# Patient Record
Sex: Female | Born: 1935 | Race: White | Hispanic: No | Smoking: Never smoker
Health system: Southern US, Community
[De-identification: ages and names within clinical notes are randomized; demographics above are authoritative.]

## PROBLEM LIST (undated history)

## (undated) DIAGNOSIS — D649 Anemia, unspecified: Secondary | ICD-10-CM

## (undated) DIAGNOSIS — I2510 Atherosclerotic heart disease of native coronary artery without angina pectoris: Secondary | ICD-10-CM

## (undated) DIAGNOSIS — F329 Major depressive disorder, single episode, unspecified: Secondary | ICD-10-CM

## (undated) DIAGNOSIS — E7800 Pure hypercholesterolemia, unspecified: Secondary | ICD-10-CM

## (undated) DIAGNOSIS — I10 Essential (primary) hypertension: Secondary | ICD-10-CM

## (undated) DIAGNOSIS — F32A Depression, unspecified: Secondary | ICD-10-CM

## (undated) DIAGNOSIS — E538 Deficiency of other specified B group vitamins: Secondary | ICD-10-CM

## (undated) DIAGNOSIS — C801 Malignant (primary) neoplasm, unspecified: Secondary | ICD-10-CM

## (undated) HISTORY — PX: TONSILLECTOMY AND ADENOIDECTOMY: SHX28

## (undated) HISTORY — DX: Pure hypercholesterolemia, unspecified: E78.00

## (undated) HISTORY — PX: APPENDECTOMY: SHX54

## (undated) HISTORY — DX: Anemia, unspecified: D64.9

## (undated) HISTORY — DX: Essential (primary) hypertension: I10

## (undated) HISTORY — DX: Depression, unspecified: F32.A

## (undated) HISTORY — DX: Major depressive disorder, single episode, unspecified: F32.9

## (undated) HISTORY — PX: ABDOMINAL HYSTERECTOMY: SHX81

## (undated) HISTORY — DX: Malignant (primary) neoplasm, unspecified: C80.1

## (undated) HISTORY — DX: Deficiency of other specified B group vitamins: E53.8

---

## 1983-02-23 HISTORY — PX: BREAST SURGERY: SHX581

## 1996-02-23 HISTORY — PX: ABDOMINOPLASTY: SUR9

## 1997-10-10 ENCOUNTER — Emergency Department (HOSPITAL_COMMUNITY): Admission: EM | Admit: 1997-10-10 | Discharge: 1997-10-10 | Payer: Self-pay | Admitting: Emergency Medicine

## 1998-12-08 ENCOUNTER — Ambulatory Visit (HOSPITAL_BASED_OUTPATIENT_CLINIC_OR_DEPARTMENT_OTHER): Admission: RE | Admit: 1998-12-08 | Discharge: 1998-12-08 | Payer: Self-pay | Admitting: General Surgery

## 1999-07-13 ENCOUNTER — Encounter: Payer: Self-pay | Admitting: Orthopedic Surgery

## 1999-07-13 ENCOUNTER — Encounter: Admission: RE | Admit: 1999-07-13 | Discharge: 1999-07-13 | Payer: Self-pay | Admitting: Orthopedic Surgery

## 2000-02-10 ENCOUNTER — Other Ambulatory Visit: Admission: RE | Admit: 2000-02-10 | Discharge: 2000-02-10 | Payer: Self-pay | Admitting: Obstetrics and Gynecology

## 2001-02-10 ENCOUNTER — Other Ambulatory Visit: Admission: RE | Admit: 2001-02-10 | Discharge: 2001-02-10 | Payer: Self-pay | Admitting: Obstetrics and Gynecology

## 2002-02-06 ENCOUNTER — Other Ambulatory Visit: Admission: RE | Admit: 2002-02-06 | Discharge: 2002-02-06 | Payer: Self-pay | Admitting: Gynecology

## 2003-03-07 ENCOUNTER — Other Ambulatory Visit: Admission: RE | Admit: 2003-03-07 | Discharge: 2003-03-07 | Payer: Self-pay | Admitting: Gynecology

## 2005-04-06 ENCOUNTER — Other Ambulatory Visit: Admission: RE | Admit: 2005-04-06 | Discharge: 2005-04-06 | Payer: Self-pay | Admitting: Gynecology

## 2009-07-01 ENCOUNTER — Encounter: Payer: Self-pay | Admitting: Internal Medicine

## 2009-07-02 ENCOUNTER — Encounter: Payer: Self-pay | Admitting: Internal Medicine

## 2009-08-04 ENCOUNTER — Ambulatory Visit: Payer: Self-pay | Admitting: Internal Medicine

## 2009-08-04 DIAGNOSIS — G4730 Sleep apnea, unspecified: Secondary | ICD-10-CM

## 2009-08-04 DIAGNOSIS — G4710 Hypersomnia, unspecified: Secondary | ICD-10-CM | POA: Insufficient documentation

## 2009-08-07 DIAGNOSIS — J309 Allergic rhinitis, unspecified: Secondary | ICD-10-CM | POA: Insufficient documentation

## 2009-08-07 DIAGNOSIS — E039 Hypothyroidism, unspecified: Secondary | ICD-10-CM | POA: Insufficient documentation

## 2009-08-07 DIAGNOSIS — I10 Essential (primary) hypertension: Secondary | ICD-10-CM

## 2010-03-26 NOTE — Assessment & Plan Note (Signed)
Summary: sleep consult-orginal appt cancelled/kcw   Primary Provider/Referring Provider:  Dr Lucianne Muss  CC:  Sleep Consult-Dr. Lucianne Muss.Barbara Maxwell  History of Present Illness: August 04, 2009- 75 yoF referred courtesy of Dr Lucianne Muss for sleep apnea. She was complaining of long-standing snoring, daytime sleepiness, unrefreshing sleep. husband tells her these have been worse in past year as she has gained 15-20 lbs. Daily naps. Bedtime 10-11Pm, latency less than 10 minutes, waking 2-3 x before up at 630-7AM.  ENT surgery- tonsils. Hx goiter, bruxism, allergic rhinitis, HBP, depression. Denies cardiopulmonary disease. Had what apears to be a 3-channel home screening sleep study 07/01/09 recording AHI 15.8, c/w mild to moderate obstructive sleep apnea.   Current Medications (verified): 1)  Crestor 40 Mg Tabs (Rosuvastatin Calcium) .... Take 1 By Mouth Once Daily 2)  Benicar 20 Mg Tabs (Olmesartan Medoxomil) .... Take 1 By Mouth Once Daily 3)  Zetia 10 Mg Tabs (Ezetimibe) .... Take 1 By Mouth Once Daily 4)  Toprol Xl 25 Mg Xr24h-Tab (Metoprolol Succinate) .... Take 1/2 By Mouth Once Daily 5)  Boniva 150 Mg Tabs (Ibandronate Sodium) .... Monthly 6)  Aspirin 81 Mg Tbec (Aspirin) .... Take 1 By Mouth Once Daily 7)  Zoloft 50 Mg Tabs (Sertraline Hcl) .... Take 1 By Mouth Once Daily 8)  Xanax 0.25 Mg Tabs (Alprazolam) .... Take 1 By Mouth Three Times A Day As Needed 9)  Vagifem 10 Mcg Tabs (Estradiol) .Barbara Maxwell.. 1 Weekly 10)  Bupropion Hcl 150 Mg Xr24h-Tab (Bupropion Hcl) .... Take 1 By Mouth Once Daily  Allergies (verified): No Known Drug Allergies  Past History:  Past Medical History: Obstructive sleep apnea- Home study 07/01/09- AHI 15.8/ hr Depression Allergic Rhinitis Hypertension Hypothyroidism/ goiter  Past Surgical History: Tonsillectomy breast reduction Total Abdominal Hysterectomy Appendectomy  Family History: Family hx of Cancer-sister(ovary) Family hx of Heart Disease-father Family hx of  RA-father and mother Brother- OSA/ CPAP  Social History: Married  Retired Non smoker no ETOH  Review of Systems      See HPI       The patient complains of weight change, tooth/dental problems, headaches, and anxiety.  The patient denies shortness of breath with activity, shortness of breath at rest, productive cough, non-productive cough, coughing up blood, chest pain, irregular heartbeats, acid heartburn, indigestion, loss of appetite, abdominal pain, difficulty swallowing, sore throat, nasal congestion/difficulty breathing through nose, sneezing, itching, ear ache, depression, hand/feet swelling, joint stiffness or pain, rash, change in color of mucus, and fever.    Vital Signs:  Patient profile:   75 year old female Height:      63.5 inches Weight:      175 pounds BMI:     30.62 O2 Sat:      100 % on Room air Pulse rate:   74 / minute BP sitting:   132 / 70  (right arm) Cuff size:   regular  Vitals Entered By: Reynaldo Minium CMA (August 04, 2009 11:58 AM)  O2 Flow:  Room air CC: Sleep Consult-Dr. Lucianne Muss.   Physical Exam  Additional Exam:  General: A/Ox3; pleasant and cooperative, NAD, mild overweight SKIN: no rash, lesions NODES: no lymphadenopathy HEENT: [place]/AT, EOM- WNL, Conjuctivae- clear, PERRLA, TM-WNL, Nose- clear, Throat- clear and wnl, Mallampati  III NECK: Supple w/ fair ROM, JVD- none, normal carotid impulses w/o bruits Thyroid-Right goiter, no stridor CHEST: Clear to P&A HEART: RRR, no m/g/r heard ABDOMEN: Soft and nl; nml bowel sounds; no organomegaly or masses noted VOH:YWVP, nl pulses, no edema  NEURO:  Grossly intact to observation      Impression & Recommendations:  Problem # 1:  HYPERSOMNIA WITH SLEEP APNEA UNSPECIFIED (ICD-780.53)  We discussed the results of her 3-channel home sleep screening. We discussed the physiology and medical concerns and available treatments. She wants and is motivated tor try for awhile to try weght loss first. We agreed  to get her back in 4 months to see how she has done, with option  to get cpap autotitration and possibly to retest with formal sleep study. We emphasized her driving responsibility. Complicating cofactors include her goiter, which is not obstructing, her hx of hypertension and depression.  Medications Added to Medication List This Visit: 1)  Crestor 40 Mg Tabs (Rosuvastatin calcium) .... Take 1 by mouth once daily 2)  Benicar 20 Mg Tabs (Olmesartan medoxomil) .... Take 1 by mouth once daily 3)  Zetia 10 Mg Tabs (Ezetimibe) .... Take 1 by mouth once daily 4)  Toprol Xl 25 Mg Xr24h-tab (Metoprolol succinate) .... Take 1/2 by mouth once daily 5)  Boniva 150 Mg Tabs (Ibandronate sodium) .... Monthly 6)  Aspirin 81 Mg Tbec (Aspirin) .... Take 1 by mouth once daily 7)  Zoloft 50 Mg Tabs (Sertraline hcl) .... Take 1 by mouth once daily 8)  Xanax 0.25 Mg Tabs (Alprazolam) .... Take 1 by mouth three times a day as needed 9)  Vagifem 10 Mcg Tabs (Estradiol) .Barbara KitchenMarland KitchenMarland Maxwell 1 weekly 10)  Bupropion Hcl 150 Mg Xr24h-tab (Bupropion hcl) .... Take 1 by mouth once daily  Other Orders: Consultation Level IV ([id number])  Patient Instructions: 1)  Please schedule a follow-up appointment in 4 months. 2)  Please be carefull with your driving 3)  Let's see how you do at losing some weight and whether that changes what you and your husband notice as far as sleepiness and snoring. 4)  Copy sent to: Dr Lucianne Muss

## 2011-04-20 DIAGNOSIS — M949 Disorder of cartilage, unspecified: Secondary | ICD-10-CM | POA: Diagnosis not present

## 2011-04-20 DIAGNOSIS — E049 Nontoxic goiter, unspecified: Secondary | ICD-10-CM | POA: Diagnosis not present

## 2011-04-20 DIAGNOSIS — E785 Hyperlipidemia, unspecified: Secondary | ICD-10-CM | POA: Diagnosis not present

## 2011-04-20 DIAGNOSIS — M899 Disorder of bone, unspecified: Secondary | ICD-10-CM | POA: Diagnosis not present

## 2011-04-20 DIAGNOSIS — I10 Essential (primary) hypertension: Secondary | ICD-10-CM | POA: Diagnosis not present

## 2011-04-22 DIAGNOSIS — E559 Vitamin D deficiency, unspecified: Secondary | ICD-10-CM | POA: Diagnosis not present

## 2011-04-22 DIAGNOSIS — I10 Essential (primary) hypertension: Secondary | ICD-10-CM | POA: Diagnosis not present

## 2011-04-22 DIAGNOSIS — R7301 Impaired fasting glucose: Secondary | ICD-10-CM | POA: Diagnosis not present

## 2011-04-22 DIAGNOSIS — M899 Disorder of bone, unspecified: Secondary | ICD-10-CM | POA: Diagnosis not present

## 2011-04-22 DIAGNOSIS — E049 Nontoxic goiter, unspecified: Secondary | ICD-10-CM | POA: Diagnosis not present

## 2011-04-22 DIAGNOSIS — E785 Hyperlipidemia, unspecified: Secondary | ICD-10-CM | POA: Diagnosis not present

## 2011-07-07 DIAGNOSIS — H16209 Unspecified keratoconjunctivitis, unspecified eye: Secondary | ICD-10-CM | POA: Diagnosis not present

## 2011-07-07 DIAGNOSIS — H04129 Dry eye syndrome of unspecified lacrimal gland: Secondary | ICD-10-CM | POA: Diagnosis not present

## 2011-07-07 DIAGNOSIS — D3130 Benign neoplasm of unspecified choroid: Secondary | ICD-10-CM | POA: Diagnosis not present

## 2011-07-07 DIAGNOSIS — H259 Unspecified age-related cataract: Secondary | ICD-10-CM | POA: Diagnosis not present

## 2011-07-14 DIAGNOSIS — Z1231 Encounter for screening mammogram for malignant neoplasm of breast: Secondary | ICD-10-CM | POA: Diagnosis not present

## 2011-07-30 DIAGNOSIS — E7800 Pure hypercholesterolemia, unspecified: Secondary | ICD-10-CM | POA: Diagnosis not present

## 2011-07-30 DIAGNOSIS — N8184 Pelvic muscle wasting: Secondary | ICD-10-CM | POA: Diagnosis not present

## 2011-07-30 DIAGNOSIS — Z01419 Encounter for gynecological examination (general) (routine) without abnormal findings: Secondary | ICD-10-CM | POA: Diagnosis not present

## 2011-07-30 DIAGNOSIS — I10 Essential (primary) hypertension: Secondary | ICD-10-CM | POA: Diagnosis not present

## 2011-08-18 DIAGNOSIS — R7301 Impaired fasting glucose: Secondary | ICD-10-CM | POA: Diagnosis not present

## 2011-08-18 DIAGNOSIS — E559 Vitamin D deficiency, unspecified: Secondary | ICD-10-CM | POA: Diagnosis not present

## 2011-08-18 DIAGNOSIS — E785 Hyperlipidemia, unspecified: Secondary | ICD-10-CM | POA: Diagnosis not present

## 2011-08-20 DIAGNOSIS — R7301 Impaired fasting glucose: Secondary | ICD-10-CM | POA: Diagnosis not present

## 2011-08-20 DIAGNOSIS — I10 Essential (primary) hypertension: Secondary | ICD-10-CM | POA: Diagnosis not present

## 2011-08-20 DIAGNOSIS — E559 Vitamin D deficiency, unspecified: Secondary | ICD-10-CM | POA: Diagnosis not present

## 2011-08-20 DIAGNOSIS — E785 Hyperlipidemia, unspecified: Secondary | ICD-10-CM | POA: Diagnosis not present

## 2011-10-19 DIAGNOSIS — L821 Other seborrheic keratosis: Secondary | ICD-10-CM | POA: Diagnosis not present

## 2011-10-19 DIAGNOSIS — E785 Hyperlipidemia, unspecified: Secondary | ICD-10-CM | POA: Diagnosis not present

## 2011-10-19 DIAGNOSIS — D1801 Hemangioma of skin and subcutaneous tissue: Secondary | ICD-10-CM | POA: Diagnosis not present

## 2011-10-19 DIAGNOSIS — D239 Other benign neoplasm of skin, unspecified: Secondary | ICD-10-CM | POA: Diagnosis not present

## 2011-10-19 DIAGNOSIS — L578 Other skin changes due to chronic exposure to nonionizing radiation: Secondary | ICD-10-CM | POA: Diagnosis not present

## 2011-10-21 DIAGNOSIS — E785 Hyperlipidemia, unspecified: Secondary | ICD-10-CM | POA: Diagnosis not present

## 2011-10-21 DIAGNOSIS — R7301 Impaired fasting glucose: Secondary | ICD-10-CM | POA: Diagnosis not present

## 2011-10-21 DIAGNOSIS — I10 Essential (primary) hypertension: Secondary | ICD-10-CM | POA: Diagnosis not present

## 2011-12-02 DIAGNOSIS — Z23 Encounter for immunization: Secondary | ICD-10-CM | POA: Diagnosis not present

## 2012-01-26 DIAGNOSIS — E559 Vitamin D deficiency, unspecified: Secondary | ICD-10-CM | POA: Diagnosis not present

## 2012-01-26 DIAGNOSIS — I10 Essential (primary) hypertension: Secondary | ICD-10-CM | POA: Diagnosis not present

## 2012-01-26 DIAGNOSIS — E049 Nontoxic goiter, unspecified: Secondary | ICD-10-CM | POA: Diagnosis not present

## 2012-01-26 DIAGNOSIS — R7301 Impaired fasting glucose: Secondary | ICD-10-CM | POA: Diagnosis not present

## 2012-01-26 DIAGNOSIS — E785 Hyperlipidemia, unspecified: Secondary | ICD-10-CM | POA: Diagnosis not present

## 2012-01-26 DIAGNOSIS — D649 Anemia, unspecified: Secondary | ICD-10-CM | POA: Diagnosis not present

## 2012-01-28 DIAGNOSIS — E049 Nontoxic goiter, unspecified: Secondary | ICD-10-CM | POA: Diagnosis not present

## 2012-01-28 DIAGNOSIS — D649 Anemia, unspecified: Secondary | ICD-10-CM | POA: Diagnosis not present

## 2012-01-28 DIAGNOSIS — E785 Hyperlipidemia, unspecified: Secondary | ICD-10-CM | POA: Diagnosis not present

## 2012-01-28 DIAGNOSIS — I10 Essential (primary) hypertension: Secondary | ICD-10-CM | POA: Diagnosis not present

## 2012-01-28 DIAGNOSIS — E559 Vitamin D deficiency, unspecified: Secondary | ICD-10-CM | POA: Diagnosis not present

## 2012-01-28 DIAGNOSIS — R7301 Impaired fasting glucose: Secondary | ICD-10-CM | POA: Diagnosis not present

## 2012-01-28 DIAGNOSIS — Z0000 Encounter for general adult medical examination without abnormal findings: Secondary | ICD-10-CM | POA: Diagnosis not present

## 2012-01-28 DIAGNOSIS — M899 Disorder of bone, unspecified: Secondary | ICD-10-CM | POA: Diagnosis not present

## 2012-02-03 DIAGNOSIS — Z1211 Encounter for screening for malignant neoplasm of colon: Secondary | ICD-10-CM | POA: Diagnosis not present

## 2012-02-28 DIAGNOSIS — D649 Anemia, unspecified: Secondary | ICD-10-CM | POA: Diagnosis not present

## 2012-02-28 DIAGNOSIS — I10 Essential (primary) hypertension: Secondary | ICD-10-CM | POA: Diagnosis not present

## 2012-03-03 DIAGNOSIS — L819 Disorder of pigmentation, unspecified: Secondary | ICD-10-CM | POA: Diagnosis not present

## 2012-03-03 DIAGNOSIS — L570 Actinic keratosis: Secondary | ICD-10-CM | POA: Diagnosis not present

## 2012-03-03 DIAGNOSIS — Z85820 Personal history of malignant melanoma of skin: Secondary | ICD-10-CM | POA: Diagnosis not present

## 2012-03-03 DIAGNOSIS — B009 Herpesviral infection, unspecified: Secondary | ICD-10-CM | POA: Diagnosis not present

## 2012-05-15 DIAGNOSIS — I10 Essential (primary) hypertension: Secondary | ICD-10-CM | POA: Diagnosis not present

## 2012-05-15 DIAGNOSIS — D649 Anemia, unspecified: Secondary | ICD-10-CM | POA: Diagnosis not present

## 2012-05-15 DIAGNOSIS — E785 Hyperlipidemia, unspecified: Secondary | ICD-10-CM | POA: Diagnosis not present

## 2012-07-06 DIAGNOSIS — H1045 Other chronic allergic conjunctivitis: Secondary | ICD-10-CM | POA: Diagnosis not present

## 2012-07-06 DIAGNOSIS — H259 Unspecified age-related cataract: Secondary | ICD-10-CM | POA: Diagnosis not present

## 2012-07-06 DIAGNOSIS — H16209 Unspecified keratoconjunctivitis, unspecified eye: Secondary | ICD-10-CM | POA: Diagnosis not present

## 2012-07-19 DIAGNOSIS — Z1231 Encounter for screening mammogram for malignant neoplasm of breast: Secondary | ICD-10-CM | POA: Diagnosis not present

## 2012-09-29 ENCOUNTER — Other Ambulatory Visit: Payer: Self-pay

## 2012-09-29 ENCOUNTER — Other Ambulatory Visit: Payer: Self-pay | Admitting: *Deleted

## 2012-09-29 DIAGNOSIS — E039 Hypothyroidism, unspecified: Secondary | ICD-10-CM

## 2012-09-29 DIAGNOSIS — E785 Hyperlipidemia, unspecified: Secondary | ICD-10-CM | POA: Insufficient documentation

## 2012-10-01 ENCOUNTER — Other Ambulatory Visit: Payer: Self-pay | Admitting: Endocrinology

## 2012-10-01 DIAGNOSIS — E785 Hyperlipidemia, unspecified: Secondary | ICD-10-CM

## 2012-10-01 DIAGNOSIS — I10 Essential (primary) hypertension: Secondary | ICD-10-CM

## 2012-10-03 ENCOUNTER — Other Ambulatory Visit (INDEPENDENT_AMBULATORY_CARE_PROVIDER_SITE_OTHER): Payer: Medicare Other

## 2012-10-03 DIAGNOSIS — E039 Hypothyroidism, unspecified: Secondary | ICD-10-CM

## 2012-10-03 DIAGNOSIS — E785 Hyperlipidemia, unspecified: Secondary | ICD-10-CM

## 2012-10-03 DIAGNOSIS — I10 Essential (primary) hypertension: Secondary | ICD-10-CM

## 2012-10-03 LAB — COMPREHENSIVE METABOLIC PANEL
ALT: 16 U/L (ref 0–35)
AST: 21 U/L (ref 0–37)
Albumin: 4.2 g/dL (ref 3.5–5.2)
Alkaline Phosphatase: 47 U/L (ref 39–117)
BUN: 14 mg/dL (ref 6–23)
CO2: 26 mEq/L (ref 19–32)
Calcium: 9.8 mg/dL (ref 8.4–10.5)
Chloride: 105 mEq/L (ref 96–112)
Creatinine, Ser: 1 mg/dL (ref 0.4–1.2)
GFR: 60.91 mL/min (ref >60.00)
Glucose, Bld: 91 mg/dL (ref 70–99)
Potassium: 4.2 mEq/L (ref 3.5–5.1)
Sodium: 141 mEq/L (ref 135–145)
Total Bilirubin: 1 mg/dL (ref 0.3–1.2)
Total Protein: 7.4 g/dL (ref 6.0–8.3)

## 2012-10-03 LAB — TSH: TSH: 0.43 u[IU]/mL (ref 0.35–5.50)

## 2012-10-03 LAB — LIPID PANEL
Cholesterol: 145 mg/dL (ref 0–200)
HDL: 44.3 mg/dL (ref >39.00)
LDL Cholesterol: 67 mg/dL (ref 0–99)
Total CHOL/HDL Ratio: 3
Triglycerides: 170 mg/dL — ABNORMAL HIGH (ref 0.0–149.0)
VLDL: 34 mg/dL (ref 0.0–40.0)

## 2012-10-03 LAB — T4, FREE: Free T4: 0.77 ng/dL (ref 0.60–1.60)

## 2012-10-06 ENCOUNTER — Ambulatory Visit (INDEPENDENT_AMBULATORY_CARE_PROVIDER_SITE_OTHER): Payer: Medicare Other | Admitting: Endocrinology

## 2012-10-06 ENCOUNTER — Encounter: Payer: Self-pay | Admitting: Endocrinology

## 2012-10-06 VITALS — BP 124/70 | HR 68 | Temp 98.9°F | Resp 12 | Ht 64.0 in | Wt 166.6 lb

## 2012-10-06 DIAGNOSIS — R7301 Impaired fasting glucose: Secondary | ICD-10-CM

## 2012-10-06 DIAGNOSIS — D509 Iron deficiency anemia, unspecified: Secondary | ICD-10-CM | POA: Diagnosis not present

## 2012-10-06 DIAGNOSIS — E785 Hyperlipidemia, unspecified: Secondary | ICD-10-CM

## 2012-10-06 DIAGNOSIS — I10 Essential (primary) hypertension: Secondary | ICD-10-CM

## 2012-10-06 NOTE — Progress Notes (Signed)
Patient ID: Barbara Maxwell, female   DOB: Jan 21, 1936, 77 y.o.   MRN: [id number]  Chief complaint: Cough  History of Present Illness:  1. A couple months ago she developed an acute cough with some discolored sputum but no wheezing. She did not get treated with antibiotics and the cough is significantly better now 2. She has had long-standing mild hypertension which has been well-controlled. Occasionally will monitor at home and blood pressure is normal 3. Hyperlipidemia: She has had high triglycerides, low HDL and increased LDL. Baseline LDL particle number was [id number] and she is on a multidrug regimen of Crestor, Zetia and lipids are excellent now. Previously in 12/13 her LDL was 77 with triglycerides 125. She is usually watching her diet but recently has not been able to do her water aerobics. Is trying to be active with lawn mowing     Medication List       This list is accurate as of: 10/06/12 10:19 AM.  Always use your most recent med list.               acetaminophen 325 MG tablet  Commonly known as:  TYLENOL  Take 650 mg by mouth every 6 (six) hours as needed for pain.     aspirin 81 MG tablet  Take 81 mg by mouth daily.     cholecalciferol 400 UNITS Tabs tablet  Commonly known as:  VITAMIN D  Take 5,000 Units by mouth.     ezetimibe 10 MG tablet  Commonly known as:  ZETIA  Take 10 mg by mouth daily.     ferrous sulfate 325 (65 FE) MG tablet  Take 325 mg by mouth daily with breakfast.     ibuprofen 200 MG tablet  Commonly known as:  ADVIL,MOTRIN  Take 200 mg by mouth every 6 (six) hours as needed for pain.     metoprolol succinate 25 MG 24 hr tablet  Commonly known as:  TOPROL-XL  Take 25 mg by mouth daily. 1/2 tablet daily     olmesartan 20 MG tablet  Commonly known as:  BENICAR  Take 20 mg by mouth daily. Take 1/2 tablet daily     rosuvastatin 40 MG tablet  Commonly known as:  CRESTOR  Take 40 mg by mouth daily.     sertraline 25 MG tablet  Commonly known as:   ZOLOFT  Take 25 mg by mouth daily.     VAGIFEM 10 MCG Tabs vaginal tablet  Generic drug:  Estradiol  Place 10 mcg vaginally.        Allergies:  Allergies  Allergen Reactions  . Codeine Nausea Only    No past medical history on file.  No past surgical history on file.  No family history on file.  Social History:  reports that she has never smoked. She has never used smokeless tobacco. Her alcohol and drug histories are not on file.  Review of Systems -   She has a history of mild depression and anxiety, treated with low-dose Zoloft History of iron deficiency anemia with hemoglobin normal at 13.6 in 3/14, previously was 11.1 History of mild impaired fasting glucose, glucose is normal at fasting and last A1c was 6.1 in 12/13  Appointment on 10/03/2012  Component Date Value Range Status  . Free T4 10/03/2012 0.77  0.60 - 1.60 ng/dL Final  . TSH 40/98/1191 0.43  0.35 - 5.50 uIU/mL Final  . Cholesterol 10/03/2012 145  0 - 200 mg/dL Final   ATP III Classification  Desirable:  < 200 mg/dL               Borderline High:  200 - 239 mg/dL          High:  > = 161 mg/dL  . Triglycerides 10/03/2012 170.0* 0.0 - 149.0 mg/dL Final   Normal:  <096 mg/dLBorderline High:  150 - 199 mg/dL  . HDL 10/03/2012 44.30  >39.00 mg/dL Final  . VLDL 04/54/0981 34.0  0.0 - 40.0 mg/dL Final  . LDL Cholesterol 10/03/2012 67  0 - 99 mg/dL Final  . Total CHOL/HDL Ratio 10/03/2012 3   Final                  Men          Women1/2 Average Risk     3.4          3.3Average Risk          5.0          4.42X Average Risk          9.6          7.13X Average Risk          15.0          11.0                      . Sodium 10/03/2012 141  135 - 145 mEq/L Final  . Potassium 10/03/2012 4.2  3.5 - 5.1 mEq/L Final  . Chloride 10/03/2012 105  96 - 112 mEq/L Final  . CO2 10/03/2012 26  19 - 32 mEq/L Final  . Glucose, Bld 10/03/2012 91  70 - 99 mg/dL Final  . BUN 19/14/7829 14  6 - 23 mg/dL Final  . Creatinine, Ser  10/03/2012 1.0  0.4 - 1.2 mg/dL Final  . Total Bilirubin 10/03/2012 1.0  0.3 - 1.2 mg/dL Final  . Alkaline Phosphatase 10/03/2012 47  39 - 117 U/L Final  . AST 10/03/2012 21  0 - 37 U/L Final  . ALT 10/03/2012 16  0 - 35 U/L Final  . Total Protein 10/03/2012 7.4  6.0 - 8.3 g/dL Final  . Albumin 56/21/3086 4.2  3.5 - 5.2 g/dL Final  . Calcium 57/84/6962 9.8  8.4 - 10.5 mg/dL Final  . GFR 95/28/4132 60.91  >60.00 mL/min Final    EXAM:  BP 124/70  Pulse 68  Temp(Src) 98.9 F (37.2 C)  Resp 12  Ht 5\' 4"  (1.626 m)  Wt 166 lb 9.6 oz (75.569 kg)  BMI 28.58 kg/m2  SpO2 97%  Assessment/Plan:   HYPERTENSION: Excellent control  HYPERLIPIDEMIA: Good control with LDL below 70 and triglycerides now below 200 as well as reasonably good HDL of 44 History of increased LDL particle number  Exa Bomba 10/06/2012, 10:19 AM

## 2012-11-07 ENCOUNTER — Encounter: Payer: Self-pay | Admitting: Anesthesiology

## 2012-11-07 ENCOUNTER — Encounter: Payer: Self-pay | Admitting: Gynecology

## 2012-11-07 ENCOUNTER — Ambulatory Visit (INDEPENDENT_AMBULATORY_CARE_PROVIDER_SITE_OTHER): Payer: Medicare Other | Admitting: Gynecology

## 2012-11-07 VITALS — BP 132/84 | Ht 63.5 in | Wt 167.0 lb

## 2012-11-07 DIAGNOSIS — N816 Rectocele: Secondary | ICD-10-CM

## 2012-11-07 DIAGNOSIS — Z79890 Hormone replacement therapy (postmenopausal): Secondary | ICD-10-CM

## 2012-11-07 DIAGNOSIS — Z8041 Family history of malignant neoplasm of ovary: Secondary | ICD-10-CM | POA: Diagnosis not present

## 2012-11-07 DIAGNOSIS — N952 Postmenopausal atrophic vaginitis: Secondary | ICD-10-CM

## 2012-11-07 DIAGNOSIS — Z780 Asymptomatic menopausal state: Secondary | ICD-10-CM

## 2012-11-07 MED ORDER — ESTRADIOL 10 MCG VA TABS: 10.0000 ug | ORAL_TABLET | VAGINAL | Status: DC

## 2012-11-07 NOTE — Patient Instructions (Addendum)
Transvaginal Ultrasound Transvaginal ultrasound is a pelvic ultrasound, using a metal probe that is placed in the vagina, to look at a women's female organs. Transvaginal ultrasound is a method of seeing inside the pelvis of a woman. The ultrasound machine sends out sound waves from the transducer (probe). These sound waves bounce off body structures (like an echo) to create a picture. The picture shows up on a monitor. It is called transvaginal because the probe is inserted into the vagina. There should be very little discomfort from the vaginal probe. This test can also be used during pregnancy. Endovaginal ultrasound is another name for a transvaginal ultrasound. In a transabdominal ultrasound, the probe is placed on the outside of the belly. This method gives pictures that are lower quality than pictures from the transvaginal technique. Transvaginal ultrasound is used to look for problems of the female genital tract. Some such problems include:  Infertility problems.  Congenital (birth defect) malformations of the uterus and ovaries.  Tumors in the uterus.  Abnormal bleeding.  Ovarian tumors and cysts.  Abscess (inflamed tissue around pus) in the pelvis.  Unexplained abdominal or pelvic pain.  Pelvic infection. DURING PREGNANCY, TRANSVAGINAL ULTRASOUND MAY BE USED TO LOOK AT:  Normal pregnancy.  Ectopic pregnancy (pregnancy outside the uterus).  Fetal heartbeat.  Abnormalities in the pelvis, that are not seen well with transabdominal ultrasound.  Suspected twins or multiples.  Impending miscarriage.  Problems with the cervix (incompetent cervix, not able to stay closed and hold the baby).  When doing an amniocentesis (removing fluid from the pregnancy sac, for testing).  Looking for abnormalities of the baby.  Checking the growth, development, and age of the fetus.  Measuring the amount of fluid in the amniotic sac.  When doing an external version of the baby (moving  baby into correct position).  Evaluating the baby for problems in high risk pregnancies (biophysical profile).  Suspected fetal demise (death). Sometimes a special ultrasound method called Saline Infusion Sonography (SIS) is used for a more accurate look at the uterus. Sterile saline (salt water) is injected into the uterus of non-pregnant patients to see the inside of the uterus better. SIS is not used on pregnant women. The vaginal probe can also assist in obtaining biopsies of abnormal areas, in draining fluid from cysts on the ovary, and in finding IUDs (intrauterine device, birth control) that cannot be located. PREPARATION FOR TEST A transvaginal ultrasound is done with the bladder empty. The transabdominal ultrasound is done with your bladder full. You may be asked to drink several glasses of water before that exam. Sometimes, a transabdominal ultrasound is done just after a transvaginal ultrasound, to look at organs in your abdomen. PROCEDURE  You will lie down on a table, with your knees bent and your feet in foot holders. The probe is covered with a condom. A sterile lubricant is put into the vagina and on the probe. The lubricant helps transmit the sound waves and avoid irritating the vagina. Your caregiver will move the probe inside the vaginal cavity to scan the pelvic structures. A normal test will show a normal pelvis and normal contents. An abnormal test will show abnormalities of the pelvis, placenta, or baby. ABNORMAL RESULTS MAY BE DUE TO:  Growths or tumors in the:  Uterus.  Ovaries.  Vagina.  Other pelvic structures.  Non-cancerous growths of the uterus and ovaries.  Twisting of the ovary, cutting off blood supply to the ovary (ovarian torsion).  Areas of infection, including:  Pelvic  inflammatory disease.  Abscess in the pelvis.  Locating an IUD. PROBLEMS FOUND IN PREGNANT WOMEN MAY INCLUDE:  Ectopic pregnancy (pregnancy outside the uterus).  Multiple  pregnancies.  Early dilation (opening) of the cervix. This may indicate an incompetent cervix and early delivery.  Impending miscarriage.  Fetal death.  Problems with the placenta, including:  Placenta has grown over the opening of the womb (placenta previa).  Placenta has separated early in the womb (placental abruption).  Placenta grows into the muscle of the uterus (placenta accreta).  Tumors of pregnancy, including gestational trophoblastic disease. This is an abnormal pregnancy, with no fetus. The uterus is filled with many grape-like cysts that could sometimes be cancerous.  Incorrect position of the fetus (breech, vertex).  Intrauterine fetal growth retardation (IUGR) (poor growth in the womb).  Fetal abnormalities or infection. RISKS AND COMPLICATIONS There are no known risks to the ultrasound procedure. There is no X-ray used when doing an ultrasound. Document Released: 01/21/2004 Document Revised: 05/03/2011 Document Reviewed: 01/08/2009 [facility] Patient Information 2014 [place], [place]. Bone Densitometry Bone densitometry is a special X-ray that measures your bone density and can be used to help predict your risk of bone fractures. This test is used to determine bone mineral content and density to diagnose osteoporosis. Osteoporosis is the loss of bone that may cause the bone to become weak. Osteoporosis commonly occurs in women entering menopause. However, it may be found in men and in people with other diseases. PREPARATION FOR TEST No preparation necessary. WHO SHOULD BE TESTED?  All women older than 35.  Postmenopausal women (50 to 49) with risk factors for osteoporosis.  People with a previous fracture caused by normal activities.  People with a small body frame (less than 127 poundsor a body mass index [BMI] of less than 21).  People who have a parent with a hip fracture or history of osteoporosis.  People who smoke.  People who have rheumatoid  arthritis.  Anyone who engages in excessive alcohol use (more than 3 drinks most days).  Women who experience early menopause. WHEN SHOULD YOU BE RETESTED? Current guidelines suggest that you should wait at least 2 years before doing a bone density test again if your first test was normal.Recent studies indicated that women with normal bone density may be able to wait a few years before needing to repeat a bone density test. You should discuss this with your caregiver.  NORMAL FINDINGS   Normal: less than standard deviation below normal (greater than -1).  Osteopenia: 1 to 2.5 standard deviations below normal (-1 to -2.5).  Osteoporosis: greater than 2.5 standard deviations below normal (less than -2.5). Test results are reported as a "T score" and a "Z score."The T score is a number that compares your bone density with the bone density of healthy, young women.The Z score is a number that compares your bone density with the scores of women who are the same age, gender, and race.  Ranges for normal findings may vary among different laboratories and hospitals. You should always check with your doctor after having lab work or other tests done to discuss the meaning of your test results and whether your values are considered within normal limits. MEANING OF TEST  Your caregiver will go over the test results with you and discuss the importance and meaning of your results, as well as treatment options and the need for additional tests if necessary. OBTAINING THE TEST RESULTS It is your responsibility to obtain your test results. Ask  the lab or department performing the test when and how you will get your results. Document Released: 03/02/2004 Document Revised: 05/03/2011 Document Reviewed: 03/25/2010 [facility] Patient Information 2014 [place].

## 2012-11-07 NOTE — Progress Notes (Addendum)
Barbara Maxwell Mar 01, 1935 [id number]   History:    77 y.o. and is was under the care of Dr. Nicholas Lose. Patient with a history of vaginal atrophy vaginal irritation and discomfort. The patient is on Vagifem 10 mcg which she applies twice a week vaginally. Patient many years ago had a history of transvaginal hysterectomy secondary to fibroids and menorrhagia. Her colonoscopy was normal in 2006 done by Dr. Kinnie Scales. Patient with no previous history of abnormal Pap smears. Mammogram May of this year was normal. Last bone density study 2010 was normal.  Patient with history of melanoma in her back in 2001 had been followed by dermatologist Dr. Lavonna Rua and a wide excision done by Dr. Bishop Dublin. Patient also has had history of breast reduction as well as abdominoplasty several years ago. Patient has had the shinglkes and Tdap vaccine. are   Patient's sister had ovarian cancer at the age of 55    Past medical history,surgical history, family history and social history were all reviewed and documented in the EPIC chart.  Gynecologic History No LMP recorded. Patient is postmenopausal. Contraception: status post hysterectomy Last Pap: 2011. Results were: normal Last mammogram: 2014. Results were: normal  Obstetric History OB History  Gravida Para Term Preterm AB SAB TAB Ectopic Multiple Living  4 4        4     # Outcome Date GA Lbr Len/2nd Weight Sex Delivery Anes PTL Lv  4 PAR           3 PAR           2 PAR           1 PAR                ROS: A ROS was performed and pertinent positives and negatives are included in the history.  GENERAL: No fevers or chills. HEENT: No change in vision, no earache, sore throat or sinus congestion. NECK: No pain or stiffness. CARDIOVASCULAR: No chest pain or pressure. No palpitations. PULMONARY: No shortness of breath, cough or wheeze. GASTROINTESTINAL: No abdominal pain, nausea, vomiting or diarrhea, melena or bright red blood per rectum. GENITOURINARY: No urinary  frequency, urgency, hesitancy or dysuria. MUSCULOSKELETAL: No joint or muscle pain, no back pain, no recent trauma. DERMATOLOGIC: No rash, no itching, no lesions. ENDOCRINE: No polyuria, polydipsia, no heat or cold intolerance. No recent change in weight. HEMATOLOGICAL: No anemia or easy bruising or bleeding. NEUROLOGIC: No headache, seizures, numbness, tingling or weakness. PSYCHIATRIC: No depression, no loss of interest in normal activity or change in sleep pattern.     Exam: chaperone present  BP 132/84  Ht 5' 3.5" (1.613 m)  Wt 167 lb (75.751 kg)  BMI 29.12 kg/m2  Body mass index is 29.12 kg/(m^2).  General appearance : Well developed well nourished female. No acute distress HEENT: Neck supple, trachea midline, no carotid bruits, no thyroidmegaly Lungs: Clear to auscultation, no rhonchi or wheezes, or rib retractions  Heart: Regular rate and rhythm, no murmurs or gallops Breast:Examined in sitting and supine position were symmetrical in appearance, no palpable masses or tenderness,  no skin retraction, no nipple inversion, no nipple discharge, no skin discoloration, no axillary or supraclavicular lymphadenopathy Abdomen: no palpable masses or tenderness, no rebound or guarding Extremities: no edema or skin discoloration or tenderness  Pelvic:  Bartholin, Urethra, Skene Glands: Within normal limits             Vagina: No gross lesions or discharge,first-degree rectocele, vaginal atrophy  Cervix:absent  Uterus absent  Adnexa  Without masses or tenderness  Anus and perineum  normal   Rectovaginal  normal sphincter tone without palpated masses or tenderness             Hemoccult provided by her PCP.     Assessment/Plan:  77 y.o. female with postmenopausal vaginal atrophy doing well on Vagifem 10 mcg twice a week. Patient with prior history of transvaginal hysterectomy and no prior history of abnormal Pap smears. According to the new guidelines patient will no longer need Pap smears.  Patient was reminded to do her monthly breast exam. Patient will have a CA 125 drawn today and an ultrasound next week because of her sister's history of ovarian cancer. Patient was reminded to followup with her dermatologist yearly for check up. Her PCP will be drawn her lab work. She will schedule a bone density study here in our office in October. She was reminded on the importance of calcium and vitamin D in regular exercise for osteoporosis prevention.    Ok Edwards MD, 11:08 AM 11/07/2012

## 2012-11-08 ENCOUNTER — Other Ambulatory Visit: Payer: Self-pay

## 2012-11-08 LAB — CA 125: CA 125: 5.7 U/mL (ref 0.0–30.2)

## 2012-11-08 MED ORDER — ESTRADIOL 10 MCG VA TABS: 10.0000 ug | ORAL_TABLET | VAGINAL | Status: DC

## 2012-11-08 NOTE — Telephone Encounter (Signed)
Patient called to say that her Vagifem had been sent to [facility] and since it is long term Rx she needs it to go to [facility].  I recordered it to [facility].

## 2012-11-13 ENCOUNTER — Ambulatory Visit (INDEPENDENT_AMBULATORY_CARE_PROVIDER_SITE_OTHER): Payer: Medicare Other | Admitting: Gynecology

## 2012-11-13 ENCOUNTER — Other Ambulatory Visit: Payer: Self-pay | Admitting: *Deleted

## 2012-11-13 ENCOUNTER — Ambulatory Visit (INDEPENDENT_AMBULATORY_CARE_PROVIDER_SITE_OTHER): Payer: Medicare Other

## 2012-11-13 ENCOUNTER — Encounter: Payer: Self-pay | Admitting: Gynecology

## 2012-11-13 DIAGNOSIS — N8300 Follicular cyst of ovary, unspecified side: Secondary | ICD-10-CM | POA: Diagnosis not present

## 2012-11-13 DIAGNOSIS — Z8041 Family history of malignant neoplasm of ovary: Secondary | ICD-10-CM | POA: Insufficient documentation

## 2012-11-13 DIAGNOSIS — N83339 Acquired atrophy of ovary and fallopian tube, unspecified side: Secondary | ICD-10-CM | POA: Diagnosis not present

## 2012-11-13 DIAGNOSIS — N83209 Unspecified ovarian cyst, unspecified side: Secondary | ICD-10-CM | POA: Diagnosis not present

## 2012-11-13 MED ORDER — ESTRADIOL 10 MCG VA TABS: 10.0000 ug | ORAL_TABLET | VAGINAL | Status: DC

## 2012-11-13 NOTE — Patient Instructions (Addendum)
Remember the  Bone density   Bone Densitometry Bone densitometry is a special X-ray that measures your bone density and can be used to help predict your risk of bone fractures. This test is used to determine bone mineral content and density to diagnose osteoporosis. Osteoporosis is the loss of bone that may cause the bone to become weak. Osteoporosis commonly occurs in women entering menopause. However, it may be found in men and in people with other diseases. PREPARATION FOR TEST No preparation necessary. WHO SHOULD BE TESTED?  All women older than 33.  Postmenopausal women (50 to 56) with risk factors for osteoporosis.  People with a previous fracture caused by normal activities.  People with a small body frame (less than 127 poundsor a body mass index [BMI] of less than 21).  People who have a parent with a hip fracture or history of osteoporosis.  People who smoke.  People who have rheumatoid arthritis.  Anyone who engages in excessive alcohol use (more than 3 drinks most days).  Women who experience early menopause. WHEN SHOULD YOU BE RETESTED? Current guidelines suggest that you should wait at least 2 years before doing a bone density test again if your first test was normal.Recent studies indicated that women with normal bone density may be able to wait a few years before needing to repeat a bone density test. You should discuss this with your caregiver.  NORMAL FINDINGS   Normal: less than standard deviation below normal (greater than -1).  Osteopenia: 1 to 2.5 standard deviations below normal (-1 to -2.5).  Osteoporosis: greater than 2.5 standard deviations below normal (less than -2.5). Test results are reported as a "T score" and a "Z score."The T score is a number that compares your bone density with the bone density of healthy, young women.The Z score is a number that compares your bone density with the scores of women who are the same age, gender, and race.   Ranges for normal findings may vary among different laboratories and hospitals. You should always check with your doctor after having lab work or other tests done to discuss the meaning of your test results and whether your values are considered within normal limits. MEANING OF TEST  Your caregiver will go over the test results with you and discuss the importance and meaning of your results, as well as treatment options and the need for additional tests if necessary. OBTAINING THE TEST RESULTS It is your responsibility to obtain your test results. Ask the lab or department performing the test when and how you will get your results. Document Released: 03/02/2004 Document Revised: 05/03/2011 Document Reviewed: 03/25/2010 [facility] Patient Information 2014 [place], [place].

## 2012-11-13 NOTE — Progress Notes (Signed)
The patient presented to the office today to discuss the results of her ultrasound. The patient was seen for complete gynecological exam was 11/13/2012 see previous notes for details. Patient had voice and her sister had history of ovarian cancer in her 74s so she presented for ultrasound today for screening. She did have a recent normal CEA 125. Her bone density schedule the next few weeks.  Ultrasound: Patient with prior hysterectomy Small right ovary follicle measuring 18 x 13 mm avascular. Left ovary atrophic. No apparent masses seen. No fluid in the cul-de-sac.  Assessment/plan: Patient with first-line relative with history of ovarian cancer. We discussed limitations of annual screening with ultrasound and CA 125 the patient fell more, to avoid doing so every year. She is scheduled for bone density next few weeks. She is otherwise scheduled to see me in one year or when necessary.

## 2012-11-20 ENCOUNTER — Other Ambulatory Visit: Payer: Self-pay | Admitting: *Deleted

## 2012-11-20 MED ORDER — VALSARTAN 80 MG PO TABS: 80.0000 mg | ORAL_TABLET | Freq: Every day | ORAL | Status: DC

## 2012-11-22 ENCOUNTER — Ambulatory Visit (INDEPENDENT_AMBULATORY_CARE_PROVIDER_SITE_OTHER): Payer: Medicare Other

## 2012-11-22 ENCOUNTER — Ambulatory Visit: Payer: Medicare Other | Admitting: Endocrinology

## 2012-11-22 DIAGNOSIS — Z23 Encounter for immunization: Secondary | ICD-10-CM | POA: Diagnosis not present

## 2012-12-04 DIAGNOSIS — L570 Actinic keratosis: Secondary | ICD-10-CM | POA: Diagnosis not present

## 2012-12-04 DIAGNOSIS — D1801 Hemangioma of skin and subcutaneous tissue: Secondary | ICD-10-CM | POA: Diagnosis not present

## 2012-12-04 DIAGNOSIS — Z85820 Personal history of malignant melanoma of skin: Secondary | ICD-10-CM | POA: Diagnosis not present

## 2012-12-04 DIAGNOSIS — L819 Disorder of pigmentation, unspecified: Secondary | ICD-10-CM | POA: Diagnosis not present

## 2012-12-04 DIAGNOSIS — L821 Other seborrheic keratosis: Secondary | ICD-10-CM | POA: Diagnosis not present

## 2013-01-02 ENCOUNTER — Other Ambulatory Visit: Payer: Self-pay | Admitting: Gynecology

## 2013-01-02 ENCOUNTER — Ambulatory Visit (INDEPENDENT_AMBULATORY_CARE_PROVIDER_SITE_OTHER): Payer: Medicare Other

## 2013-01-02 DIAGNOSIS — M899 Disorder of bone, unspecified: Secondary | ICD-10-CM

## 2013-01-02 DIAGNOSIS — Z79890 Hormone replacement therapy (postmenopausal): Secondary | ICD-10-CM

## 2013-01-02 DIAGNOSIS — M8580 Other specified disorders of bone density and structure, unspecified site: Secondary | ICD-10-CM

## 2013-01-24 ENCOUNTER — Telehealth: Payer: Self-pay | Admitting: *Deleted

## 2013-01-24 NOTE — Telephone Encounter (Signed)
Message copied by Richardson Chiquito on Wed Jan 24, 2013  4:48 PM ------      Message from: Ok Edwards      Created: Fri Jan 12, 2013  3:26 PM       Will rescan in 2 years. Fathers history of fracture with parkinson seems it was traumatic and may have skewed FRAX result since she has a T score if  -1.1. I would still rec Ca, Vit D and PTH lab test      ----- Message -----         From: Janus Molder, CMA         Sent: 01/12/2013   3:09 PM           To: Ok Edwards, MD            Dr Lily Peer,            When I called patient and I was discussing her BD and elevated fracture risk, she stated that her dad fell at age 63 and broke his hip. He had fallen multiple times as an older adult due to his Parkinson's disease and did not break anything until the ae 92. The patient asks if that makes a difference in her result? She is osteopenic -1.1. Please advise.             Thanks Jasmia Angst       ------

## 2013-01-24 NOTE — Telephone Encounter (Signed)
Pt informed and she is seeing Dr Lucianne Muss on the 15th and will have him draw the blood and send [place] the results. KW

## 2013-02-05 ENCOUNTER — Encounter: Payer: Self-pay | Admitting: Endocrinology

## 2013-02-05 ENCOUNTER — Ambulatory Visit (INDEPENDENT_AMBULATORY_CARE_PROVIDER_SITE_OTHER): Payer: Medicare Other | Admitting: Endocrinology

## 2013-02-05 ENCOUNTER — Other Ambulatory Visit: Payer: Self-pay | Admitting: *Deleted

## 2013-02-05 VITALS — BP 118/68 | HR 70 | Temp 98.3°F | Resp 12 | Ht 64.0 in | Wt 162.2 lb

## 2013-02-05 DIAGNOSIS — E559 Vitamin D deficiency, unspecified: Secondary | ICD-10-CM | POA: Insufficient documentation

## 2013-02-05 DIAGNOSIS — E785 Hyperlipidemia, unspecified: Secondary | ICD-10-CM | POA: Diagnosis not present

## 2013-02-05 DIAGNOSIS — R7301 Impaired fasting glucose: Secondary | ICD-10-CM

## 2013-02-05 DIAGNOSIS — D509 Iron deficiency anemia, unspecified: Secondary | ICD-10-CM | POA: Diagnosis not present

## 2013-02-05 DIAGNOSIS — Z0000 Encounter for general adult medical examination without abnormal findings: Secondary | ICD-10-CM

## 2013-02-05 DIAGNOSIS — I10 Essential (primary) hypertension: Secondary | ICD-10-CM | POA: Diagnosis not present

## 2013-02-05 LAB — URINALYSIS, ROUTINE W REFLEX MICROSCOPIC
Ketones, ur: NEGATIVE
Specific Gravity, Urine: 1.025 (ref 1.000–1.030)
Total Protein, Urine: NEGATIVE
Urine Glucose: NEGATIVE
Urobilinogen, UA: 0.2 (ref 0.0–1.0)

## 2013-02-05 LAB — COMPREHENSIVE METABOLIC PANEL
AST: 27 U/L (ref 0–37)
Alkaline Phosphatase: 47 U/L (ref 39–117)
BUN: 16 mg/dL (ref 6–23)
Calcium: 9.5 mg/dL (ref 8.4–10.5)
Creatinine, Ser: 0.9 mg/dL (ref 0.4–1.2)
Glucose, Bld: 89 mg/dL (ref 70–99)

## 2013-02-05 LAB — IBC PANEL
Iron: 90 ug/dL (ref 42–145)
Transferrin: 259.6 mg/dL (ref 212.0–360.0)

## 2013-02-05 LAB — CBC WITH DIFFERENTIAL/PLATELET
Basophils Relative: 0.6 % (ref 0.0–3.0)
Eosinophils Absolute: 0.2 10*3/uL (ref 0.0–0.7)
Hemoglobin: 13.8 g/dL (ref 12.0–15.0)
Lymphocytes Relative: 46 % (ref 12.0–46.0)
MCHC: 34.1 g/dL (ref 30.0–36.0)
MCV: 87.3 fl (ref 78.0–100.0)
Monocytes Absolute: 0.4 10*3/uL (ref 0.1–1.0)
Neutro Abs: 2.9 10*3/uL (ref 1.4–7.7)
RBC: 4.63 Mil/uL (ref 3.87–5.11)

## 2013-02-05 LAB — LIPID PANEL
Cholesterol: 141 mg/dL (ref 0–200)
HDL: 42 mg/dL (ref >39.00)
LDL Cholesterol: 69 mg/dL (ref 0–99)
VLDL: 29.8 mg/dL (ref 0.0–40.0)

## 2013-02-05 MED ORDER — METOPROLOL SUCCINATE ER 25 MG PO TB24: ORAL_TABLET | ORAL | Status: DC

## 2013-02-05 NOTE — Progress Notes (Signed)
Patient ID: Barbara Maxwell, female   DOB: 1935-11-22, 77 y.o.   MRN: [id number]  Chief complaint: Followup of multiple problems  History of Present Illness:   PROBLEM 1:  High lipids since 1980s. Highest Direct LDL 209.  Initially given Lipitor in 2001. Was well controlled with Lipitor 20 mg and no side effects. Had myopathy with Zocor.  However LDL had increased to 154 and was changed to Crestor in 6/04. This was increased to 40 mg because of LDL particle number of 2343.  Zetia added in 2/09 also and LDL particle number was 1322 in 3/10. Triglycerides mildly high and HDL previously slightly low. Usually watching diet for saturated fats. Has been exercising with water aerobics about 3 times a week and otherwise active doing summer. Has lost 5 pounds since her last visit  PROBLEM 2:  High blood pressure treated since 1990 and usually well controlled.  On Benicar 10 mg and Toprol.  Lozol stopped in 9/01 because of hypotension. Blood pressure at home 120/60 rarely dizzy; lowest 110  PROBLEM 3: History of anemia. Hb 11.1 in 1/14 associated with iron deficiency. She has been taking iron supplements.  PROBLEM 4:  Vitamin D deficiency: The baseline level was 20. The last level was 34 in 12/14 and she is variably compliant with her 2000 units daily  PREVENTIVE CARE:           Annual hemoccults:          yes  Cholesterol/LDL:                             127/71  Colonoscopy/sigmoidoscopy: 1/06  Calcium supplements:  Irregular  Mammograms: 5/14  Bone density:  10/2012  Tetanus booster: 1998  Zostavax  2012   Pneumovax: 11/04  Pap smear:  9/14   Yearly flu vaccine:                     yes  Aspirin: 81 mg         Medication List       This list is accurate as of: 02/05/13 11:59 PM.  Always use your most recent med list.               acetaminophen 325 MG tablet  Commonly known as:  TYLENOL  Take 650 mg by mouth every 6 (six) hours as needed for pain.     aspirin 81 MG tablet  Take 81 mg by  mouth daily.     cholecalciferol 400 UNITS Tabs tablet  Commonly known as:  VITAMIN D  Take 5,000 Units by mouth.     Estradiol 10 MCG Tabs vaginal tablet  Commonly known as:  VAGIFEM  Place 1 tablet (10 mcg total) vaginally 2 (two) times a week.     ezetimibe 10 MG tablet  Commonly known as:  ZETIA  Take 10 mg by mouth daily.     ferrous sulfate 325 (65 FE) MG tablet  Take 325 mg by mouth daily with breakfast.     ibuprofen 200 MG tablet  Commonly known as:  ADVIL,MOTRIN  Take 200 mg by mouth every 6 (six) hours as needed for pain.     metoprolol succinate 25 MG 24 hr tablet  Commonly known as:  TOPROL-XL  1/2 tablet daily     rosuvastatin 40 MG tablet  Commonly known as:  CRESTOR  Take 40 mg by mouth daily.     sertraline  25 MG tablet  Commonly known as:  ZOLOFT  Take 25 mg by mouth daily.     valsartan 80 MG tablet  Commonly known as:  DIOVAN  Take 1 tablet (80 mg total) by mouth daily.     vitamin C 500 MG tablet  Commonly known as:  ASCORBIC ACID  Take 500 mg by mouth daily.        Allergies:  Allergies  Allergen Reactions  . Codeine Nausea Only  . Evista [Raloxifene]     Hot flashes  . Percocet [Oxycodone-Acetaminophen] Nausea And Vomiting    Nausea and vomiting    Past Medical History  Diagnosis Date  . Depression   . Hypertension   . Hypercholesterolemia   . Cancer     SMALL PLACE-  MELANOMA REMOVED FROM BACK    Episode of severe anxiety/amnesia 10/08.  Past Surgical History  Procedure Laterality Date  . Tonsillectomy and adenoidectomy    . Appendectomy    . Abdominal hysterectomy      VAGINAL HYSTERECTOMY WITH OVARIAN PRESERVATION   . Breast surgery  1985    BREAST REDUCTION   . Abdominoplasty  1998    Family History  Problem Relation Age of Onset  . Hypertension Mother   . Heart disease Father   . Diabetes Father   . Cancer Sister 17    OVARIAN   . Thyroid disease Sister   . Heart disease Brother   . Thyroid disease  Daughter   . Diabetes Paternal Grandmother      Social History:  reports that she has never smoked. She has never used smokeless tobacco. Her alcohol and drug histories are not on file.  Review of Systems -   History of mild impaired fasting glucose, glucose is normal at fasting and last A1c was 6.1 in 12/13     Eyes: Normal. Allergies have been a problem, using drops recommended by ophthalmologist     No unusual headaches;      ENT: No complaints     Thyroid: Right-sided goiter noticed in 11/2005 with diffuse uptake on scan. Previous TSH normal     She had mild to moderate sleep apnea on the study in 5/11.  Has less snoring and fatigue is now mild and previously did not want CPAP.  Was seen by pulmonologist in 6/11.   Skin:  No rash.  Has annual follow-up with dermatologist for melanoma.       No chest pain on exertion.                 No palpitations.     No leg pain on walking.                      No swelling of feet.     No shortness of breath on exertion.       Bowel habits:  Usually normal. Benefiber prevents constipation from iron. Heartburn/pain: sometimes with spicy food, may need Pepcid occasionally       Rectal bleeding/black stools: Not present.                                               No frequency of urination, nocturia: once or twice. No burning or frequency; some dribbling at end, better with using Vagifem from gynecologist      Mild back pain  with activity Rx with Tramadol.  Has spina bifida.    Osteopenia: Previous T-scores have been -1.0; recent T score -1.1 at femur neck.  Her height in youth was 5 feet 4.25 inches     Started on Zoloft in 10/08 because of anxiety and depression after an episode of panic attack. Does not feel significantly depressed        No numbness or tingling in hands or feet; no weakness in limbs.   EXAM:  BP 118/68  Pulse 70  Temp(Src) 98.3 F (36.8 C)  Resp 12  Ht 5\' 4"  (1.626 m)  Wt 162 lb 3.2 oz (73.573 kg)  BMI  27.83 kg/m2  SpO2 97%   Well-built and nourished GENERAL: No pallor, clubbing, lymphadenopathy or edema.    Skin:  no rash. Macular benign looking brown spots on back .  Dimple with tuft of hair over sacrum.   2 cm x 2 cm irregular wound on back (melanoma surgery).  EYES:  Externally normal.  Fundii:  normal discs and vessels.  ENT: Oral cavity, tongue and pharynx normal.  THYROID:  Right lobe enlarged about 2 times normal, isthmus and less enlarged about 2 times also and relatively soft and smooth. Left side minimally enlarged.  Overall gland is slightly firm without palpable nodules  CAROTIDS:  Normal character; no bruit.  HEART:  Normal apex, S1 and S2; no murmur or click.  CHEST:   Lungs:  Vescicular breath sounds heard equally.  No crepitations/ wheeze.  BREASTS:  Skin and nipples normal. No mass palpable.  ABDOMEN:  No distention.  Liver and spleen not palpable.  No other mass or tenderness.  Scar across lower abdomen.  No abnormal epigastric pulsation or bruit   RECTAL exam:  Not indicated.                     Pelvic:  Not indicated.  NEUROLOGICAL:  Reflexes are absent at ankles; 2 + in upper extremities with normal relaxation.  SPINE AND JOINTS:  No enlargement or inflammatory signs of joints.  Mild scoliosis lumbar spine.  PERIPHERAL PULSES: Pedal pulses: 2+ posterior tibialis on right and 4+ on left. 2+ dorsalis pedis pulses.    Assessment/Plan:   1. HYPERTENSION: Excellent control on 10 mg Benicar and low dose Toprol. She will continue the same regimen for now and switch to valsartan 80 mg with the next prescription as this is preferred on her insurance  2. HYPERLIPIDEMIA: Well controlled previously with LDL below 70 and triglycerides  below 200 as well as reasonably good HDL of 44 History of increased LDL particle number. Lipid panel will be checked today  3. GOITER: She has had a euthyroid diffuse goiter without previously documented cold nodules. Appears to be  gradually increasing in size especially isthmic area but is still relatively soft and smooth. Will continue to follow clinically  4. Vitamin D deficiency/borderline osteopenia: She is compliant with her vitamin D supplement and will check her level again today. Recent T score with gynecologist shows femoral neck to be -1.1 which is similar to previous results. No change in therapy needed. Not necessary to do any further bone densities as she has already taken a course of bisphosphonates and has stable levels. Intolerant to Evista  5. History of iron deficiency anemia with previous hemoglobin 11.9, to have hemoglobin checked today  6.  Chronic low back pain with spina bifida, infrequent now and controlled with tramadol as needed  7.  Situational anxiety/panic  attacks, none recently. Anxiety/depression controlled with Zoloft.  8.  History of melanoma on back.  9.  Mild allergic rhinitis and conjunctivitis  10.  Impaired fasting glucose; highest 108. More recently has been normal. A1c and glucose to be checked. Has metabolic syndrome  11. Mild sinus bradycardia as before, asymptomatic. Currently taking only low-dose metoprolol for previous history of palpitations    Barbara Maxwell 02/06/2013, 2:16 PM     Addendum: May stop iron, to have Hemoccult on the next visit   LABS:  Office Visit on 02/05/2013  Component Date Value Range Status  . Hemoglobin A1C 02/05/2013 6.1  4.6 - 6.5 % Final   Glycemic Control Guidelines for People with Diabetes:Non Diabetic:  <6%Goal of Therapy: <7%Additional Action Suggested:  >8%   . Sodium 02/05/2013 141  135 - 145 mEq/L Final  . Potassium 02/05/2013 3.8  3.5 - 5.1 mEq/L Final  . Chloride 02/05/2013 108  96 - 112 mEq/L Final  . CO2 02/05/2013 25  19 - 32 mEq/L Final  . Glucose, Bld 02/05/2013 89  70 - 99 mg/dL Final  . BUN 78/29/5621 16  6 - 23 mg/dL Final  . Creatinine, Ser 02/05/2013 0.9  0.4 - 1.2 mg/dL Final  . Total Bilirubin 02/05/2013 1.3* 0.3 -  1.2 mg/dL Final  . Alkaline Phosphatase 02/05/2013 47  39 - 117 U/L Final  . AST 02/05/2013 27  0 - 37 U/L Final  . ALT 02/05/2013 19  0 - 35 U/L Final  . Total Protein 02/05/2013 7.2  6.0 - 8.3 g/dL Final  . Albumin 30/86/5784 4.5  3.5 - 5.2 g/dL Final  . Calcium 69/62/9528 9.5  8.4 - 10.5 mg/dL Final  . GFR 41/32/4401 69.18  >60.00 mL/min Final  . WBC 02/05/2013 6.5  4.5 - 10.5 K/uL Final  . RBC 02/05/2013 4.63  3.87 - 5.11 Mil/uL Final  . Hemoglobin 02/05/2013 13.8  12.0 - 15.0 g/dL Final  . HCT 02/72/5366 40.4  36.0 - 46.0 % Final  . MCV 02/05/2013 87.3  78.0 - 100.0 fl Final  . MCHC 02/05/2013 34.1  30.0 - 36.0 g/dL Final  . RDW 44/04/4740 13.5  11.5 - 14.6 % Final  . Platelets 02/05/2013 269.0  150.0 - 400.0 K/uL Final  . Neutrophils Relative % 02/05/2013 44.5  43.0 - 77.0 % Final  . Lymphocytes Relative 02/05/2013 46.0  12.0 - 46.0 % Final  . Monocytes Relative 02/05/2013 6.2  3.0 - 12.0 % Final  . Eosinophils Relative 02/05/2013 2.7  0.0 - 5.0 % Final  . Basophils Relative 02/05/2013 0.6  0.0 - 3.0 % Final  . Neutro Abs 02/05/2013 2.9  1.4 - 7.7 K/uL Final  . Lymphs Abs 02/05/2013 3.0  0.7 - 4.0 K/uL Final  . Monocytes Absolute 02/05/2013 0.4  0.1 - 1.0 K/uL Final  . Eosinophils Absolute 02/05/2013 0.2  0.0 - 0.7 K/uL Final  . Basophils Absolute 02/05/2013 0.0  0.0 - 0.1 K/uL Final  . Color, Urine 02/05/2013 Yellow  Yellow;Lt. Yellow Final  . APPearance 02/05/2013 CLEAR  Clear Final  . Specific Gravity, Urine 02/05/2013 1.025  1.000-1.030 Final  . pH 02/05/2013 5.5  5.0 - 8.0 Final  . Total Protein, Urine 02/05/2013 NEGATIVE  Negative Final  . Urine Glucose 02/05/2013 NEGATIVE  Negative Final  . Ketones, ur 02/05/2013 NEGATIVE  Negative Final  . Bilirubin Urine 02/05/2013 NEGATIVE  Negative Final  . Hgb urine dipstick 02/05/2013 TRACE-LYSED  Negative Final  . Urobilinogen, UA 02/05/2013 0.2  0.0 - 1.0 Final  . Leukocytes, UA 02/05/2013 NEGATIVE  Negative Final  . Nitrite  02/05/2013 NEGATIVE  Negative Final  . WBC, UA 02/05/2013 0-2/hpf  0-2/hpf Final  . RBC / HPF 02/05/2013 0-2/hpf  0-2/hpf Final  . Mucus, UA 02/05/2013 Presence of  None Final  . Squamous Epithelial / LPF 02/05/2013 Few(5-10/hpf)  Rare(0-4/hpf) Final  . Bacteria, UA 02/05/2013 Rare(<10/hpf)  None Final  . Iron 02/05/2013 90  42 - 145 ug/dL Final  . Transferrin 16/11/9602 259.6  212.0 - 360.0 mg/dL Final  . Saturation Ratios 02/05/2013 24.8  20.0 - 50.0 % Final  . Vit D, 25-Hydroxy 02/05/2013 48  30 - 89 ng/mL Final   Comment: This assay accurately quantifies Vitamin D, which is the sum of the                          25-Hydroxy forms of Vitamin D2 and D3.  Studies have shown that the                          optimum concentration of 25-Hydroxy Vitamin D is 30 ng/mL or higher.                           Concentrations of Vitamin D between 20 and 29 ng/mL are considered to                          be insufficient and concentrations less than 20 ng/mL are considered                          to be deficient for Vitamin D.  . Cholesterol 02/05/2013 141  0 - 200 mg/dL Final   ATP III Classification       Desirable:  < 200 mg/dL               Borderline High:  200 - 239 mg/dL          High:  > = 540 mg/dL  . Triglycerides 02/05/2013 149.0  0.0 - 149.0 mg/dL Final   Normal:  <981 mg/dLBorderline High:  150 - 199 mg/dL  . HDL 02/05/2013 42.00  >39.00 mg/dL Final  . VLDL 19/14/7829 29.8  0.0 - 40.0 mg/dL Final  . LDL Cholesterol 02/05/2013 69  0 - 99 mg/dL Final  . Total CHOL/HDL Ratio 02/05/2013 3   Final                  Men          Women1/2 Average Risk     3.4          3.3Average Risk          5.0          4.42X Average Risk          9.6          7.13X Average Risk          15.0          11.0

## 2013-02-06 ENCOUNTER — Other Ambulatory Visit: Payer: Self-pay | Admitting: *Deleted

## 2013-02-06 ENCOUNTER — Encounter: Payer: Self-pay | Admitting: Endocrinology

## 2013-02-06 LAB — VITAMIN D 25 HYDROXY (VIT D DEFICIENCY, FRACTURES): Vit D, 25-Hydroxy: 48 ng/mL (ref 30–89)

## 2013-02-06 MED ORDER — METOPROLOL SUCCINATE ER 25 MG PO TB24: ORAL_TABLET | ORAL | Status: DC

## 2013-02-21 ENCOUNTER — Other Ambulatory Visit: Payer: Self-pay | Admitting: Endocrinology

## 2013-02-23 ENCOUNTER — Other Ambulatory Visit: Payer: Self-pay | Admitting: *Deleted

## 2013-02-23 MED ORDER — SERTRALINE HCL 25 MG PO TABS: 25.0000 mg | ORAL_TABLET | Freq: Every day | ORAL | Status: DC

## 2013-02-26 ENCOUNTER — Other Ambulatory Visit: Payer: Self-pay | Admitting: *Deleted

## 2013-02-28 ENCOUNTER — Other Ambulatory Visit: Payer: Self-pay | Admitting: *Deleted

## 2013-02-28 MED ORDER — METOPROLOL SUCCINATE ER 25 MG PO TB24: ORAL_TABLET | ORAL | Status: DC

## 2013-06-05 ENCOUNTER — Other Ambulatory Visit (INDEPENDENT_AMBULATORY_CARE_PROVIDER_SITE_OTHER): Payer: Medicare Other

## 2013-06-05 DIAGNOSIS — D509 Iron deficiency anemia, unspecified: Secondary | ICD-10-CM | POA: Diagnosis not present

## 2013-06-05 DIAGNOSIS — I10 Essential (primary) hypertension: Secondary | ICD-10-CM

## 2013-06-05 LAB — BASIC METABOLIC PANEL
BUN: 14 mg/dL (ref 6–23)
CALCIUM: 9.6 mg/dL (ref 8.4–10.5)
CHLORIDE: 107 meq/L (ref 96–112)
CO2: 26 meq/L (ref 19–32)
CREATININE: 0.8 mg/dL (ref 0.4–1.2)
GFR: 71.05 mL/min (ref >60.00)
GLUCOSE: 93 mg/dL (ref 70–99)
Potassium: 4.2 mEq/L (ref 3.5–5.1)
Sodium: 141 mEq/L (ref 135–145)

## 2013-06-05 LAB — CBC
HEMATOCRIT: 40.7 % (ref 36.0–46.0)
Hemoglobin: 13.6 g/dL (ref 12.0–15.0)
MCHC: 33.4 g/dL (ref 30.0–36.0)
MCV: 88.6 fl (ref 78.0–100.0)
Platelets: 276 10*3/uL (ref 150.0–400.0)
RBC: 4.6 Mil/uL (ref 3.87–5.11)
RDW: 13.2 % (ref 11.5–14.6)
WBC: 6.4 10*3/uL (ref 4.5–10.5)

## 2013-06-06 ENCOUNTER — Other Ambulatory Visit: Payer: Medicare Other

## 2013-06-08 ENCOUNTER — Encounter: Payer: Self-pay | Admitting: Endocrinology

## 2013-06-08 ENCOUNTER — Other Ambulatory Visit: Payer: Self-pay | Admitting: *Deleted

## 2013-06-08 ENCOUNTER — Ambulatory Visit (INDEPENDENT_AMBULATORY_CARE_PROVIDER_SITE_OTHER): Payer: Medicare Other | Admitting: Endocrinology

## 2013-06-08 VITALS — BP 122/60 | HR 68 | Temp 97.9°F | Resp 16 | Ht 64.0 in | Wt 155.8 lb

## 2013-06-08 DIAGNOSIS — E049 Nontoxic goiter, unspecified: Secondary | ICD-10-CM | POA: Diagnosis not present

## 2013-06-08 DIAGNOSIS — E785 Hyperlipidemia, unspecified: Secondary | ICD-10-CM

## 2013-06-08 DIAGNOSIS — R7301 Impaired fasting glucose: Secondary | ICD-10-CM | POA: Diagnosis not present

## 2013-06-08 DIAGNOSIS — I10 Essential (primary) hypertension: Secondary | ICD-10-CM

## 2013-06-08 MED ORDER — EZETIMIBE 10 MG PO TABS: 10.0000 mg | ORAL_TABLET | Freq: Every day | ORAL | Status: DC

## 2013-06-08 MED ORDER — ROSUVASTATIN CALCIUM 40 MG PO TABS: 40.0000 mg | ORAL_TABLET | Freq: Every day | ORAL | Status: DC

## 2013-06-08 NOTE — Progress Notes (Signed)
Patient ID: Barbara Maxwell, female   DOB: 1935-02-28, 78 y.o.   MRN: [id number]   Chief complaint: Followup of multiple problems  History of Present Illness:   PROBLEM 1:  High lipids since 1980s. Highest Direct LDL 209.  Initially given Lipitor in 2001. Was well controlled with Lipitor 20 mg and no side effects. Had myopathy with Zocor.  However LDL had increased to 154 and was changed to Crestor in 6/04.  This was increased to 40 mg because of LDL particle number of 2343.  Zetia added in 2/09 also and LDL particle number was 1289 in 12/13.  Triglycerides have been mildly high and HDL previously slightly low. Usually watching diet for saturated fats. Also has lost weight now   Lab Results  Component Value Date   CHOL 141 02/05/2013   HDL 42.00 02/05/2013   LDLCALC 69 02/05/2013   TRIG 149.0 02/05/2013   CHOLHDL 3 02/05/2013    PROBLEM 2:  High blood pressure treated since 1990 and usually well controlled.  On Benicar 10 mg and Toprol.  Lozol stopped in 9/01 because of hypotension. Blood pressure at home 116-120/60, no lightheadedness  PROBLEM 3: History of anemia.  Hb 11.1 in 1/14 associated with iron deficiency. She has been taking iron supplements. Hemoglobin is now normal. Is due for Hemoccults      Medication List       This list is accurate as of: 06/08/13  5:00 PM.  Always use your most recent med list.               acetaminophen 325 MG tablet  Commonly known as:  TYLENOL  Take 650 mg by mouth every 6 (six) hours as needed for pain.     aspirin 81 MG tablet  Take 81 mg by mouth daily.     cholecalciferol 400 UNITS Tabs tablet  Commonly known as:  VITAMIN D  Take 5,000 Units by mouth.     Estradiol 10 MCG Tabs vaginal tablet  Commonly known as:  VAGIFEM  Place 1 tablet (10 mcg total) vaginally 2 (two) times a week.     ezetimibe 10 MG tablet  Commonly known as:  ZETIA  Take 1 tablet (10 mg total) by mouth daily.     ferrous sulfate 325 (65 FE) MG tablet   Take 325 mg by mouth daily with breakfast.     ibuprofen 200 MG tablet  Commonly known as:  ADVIL,MOTRIN  Take 200 mg by mouth every 6 (six) hours as needed for pain.     metoprolol succinate 25 MG 24 hr tablet  Commonly known as:  TOPROL-XL  1/2 tablet daily     rosuvastatin 40 MG tablet  Commonly known as:  CRESTOR  Take 1 tablet (40 mg total) by mouth daily.     sertraline 25 MG tablet  Commonly known as:  ZOLOFT  Take 1 tablet (25 mg total) by mouth daily.     valsartan 80 MG tablet  Commonly known as:  DIOVAN  Take 1 tablet (80 mg total) by mouth daily.     vitamin C 500 MG tablet  Commonly known as:  ASCORBIC ACID  Take 500 mg by mouth daily.        Allergies:  Allergies  Allergen Reactions  . Simvastatin     Myopathy  . Codeine Nausea Only  . Evista [Raloxifene]     Hot flashes  . Percocet [Oxycodone-Acetaminophen] Nausea And Vomiting    Nausea and vomiting  Past Medical History  Diagnosis Date  . Depression   . Hypertension   . Hypercholesterolemia   . Cancer     SMALL PLACE-  MELANOMA REMOVED FROM BACK    Episode of severe anxiety/amnesia 10/08.  Past Surgical History  Procedure Laterality Date  . Tonsillectomy and adenoidectomy    . Appendectomy    . Abdominal hysterectomy      VAGINAL HYSTERECTOMY WITH OVARIAN PRESERVATION   . Breast surgery  1985    BREAST REDUCTION   . Abdominoplasty  1998    Family History  Problem Relation Age of Onset  . Hypertension Mother   . Heart disease Father   . Diabetes Father   . Cancer Sister 40    OVARIAN   . Thyroid disease Sister   . Heart disease Brother   . Thyroid disease Daughter   . Diabetes Paternal Grandmother      Social History:  reports that she has never smoked. She has never used smokeless tobacco. Her alcohol and drug histories are not on file.  Review of Systems -   History of mild impaired fasting glucose, glucose is normal fasting in the last few checks and A1c normal  also  Had lost weight recently  Wt Readings from Last 3 Encounters:  06/08/13 155 lb 12.8 oz (70.67 kg)  02/05/13 162 lb 3.2 oz (73.573 kg)  11/07/12 167 lb (75.751 kg)   Lab Results  Component Value Date   HGBA1C 6.1 02/05/2013      Vitamin D deficiency: The baseline level was 20. The last level was 34 in 12/14 and she is variably compliant with her 2000 units daily    EXAM:  BP 122/60  Pulse 68  Temp(Src) 97.9 F (36.6 C)  Resp 16  Ht 5\' 4"  (1.626 m)  Wt 155 lb 12.8 oz (70.67 kg)  BMI 26.73 kg/m2  SpO2 95%   Well-built and nourished   Assessment/Plan:   1. HYPERTENSION: Excellent control on 80 mg Diovan and low dose Toprol. She will continue the same regimen for now a  2. HYPERLIPIDEMIA: Well controlled previously with last LDL below 70 and triglycerides below 150 as well as reasonably good HDL of 42 History of increased LDL particle number. Lipid panel will be checked on the next visit along with particle number  3.  Impaired fasting glucose; highest 108. More recently has been normal. A1c and glucose more recently normal  4. History of iron deficiency anemia with previous hemoglobin 11.9, to stop iron now since her hemoglobin has been consistently normal along with iron saturation. Does need to do Hemoccults   5. Recent weight loss: She has had a lot of stress and may be contributing. Otherwise feeling fairly good   Elayne Snare 06/08/2013, 5:00 PM       LABS:  Appointment on 06/05/2013  Component Date Value Ref Range Status  . Sodium 06/05/2013 141  135 - 145 mEq/L Final  . Potassium 06/05/2013 4.2  3.5 - 5.1 mEq/L Final  . Chloride 06/05/2013 107  96 - 112 mEq/L Final  . CO2 06/05/2013 26  19 - 32 mEq/L Final  . Glucose, Bld 06/05/2013 93  70 - 99 mg/dL Final  . BUN 06/05/2013 14  6 - 23 mg/dL Final  . Creatinine, Ser 06/05/2013 0.8  0.4 - 1.2 mg/dL Final  . Calcium 06/05/2013 9.6  8.4 - 10.5 mg/dL Final  . GFR 06/05/2013 71.05  >60.00 mL/min Final   . WBC 06/05/2013 6.4  4.5 - 10.5 K/uL Final  . RBC 06/05/2013 4.60  3.87 - 5.11 Mil/uL Final  . Platelets 06/05/2013 276.0  150.0 - 400.0 K/uL Final  . Hemoglobin 06/05/2013 13.6  12.0 - 15.0 g/dL Final  . HCT 06/05/2013 40.7  36.0 - 46.0 % Final  . MCV 06/05/2013 88.6  78.0 - 100.0 fl Final  . MCHC 06/05/2013 33.4  30.0 - 36.0 g/dL Final  . RDW 06/05/2013 13.2  11.5 - 14.6 % Final

## 2013-06-11 ENCOUNTER — Other Ambulatory Visit: Payer: Self-pay | Admitting: *Deleted

## 2013-06-12 ENCOUNTER — Telehealth: Payer: Self-pay | Admitting: Endocrinology

## 2013-06-12 NOTE — Telephone Encounter (Signed)
Pharmacist needs clarification on the zetia 10 mg tabs

## 2013-06-13 ENCOUNTER — Other Ambulatory Visit: Payer: Self-pay | Admitting: *Deleted

## 2013-06-13 ENCOUNTER — Other Ambulatory Visit (INDEPENDENT_AMBULATORY_CARE_PROVIDER_SITE_OTHER): Payer: Medicare Other

## 2013-06-13 DIAGNOSIS — Z1211 Encounter for screening for malignant neoplasm of colon: Secondary | ICD-10-CM | POA: Diagnosis not present

## 2013-06-13 DIAGNOSIS — N816 Rectocele: Secondary | ICD-10-CM

## 2013-06-13 LAB — FECAL OCCULT BLOOD, IMMUNOCHEMICAL: Fecal Occult Bld: NEGATIVE

## 2013-06-14 NOTE — Progress Notes (Signed)
Quick Note:  Please let patient know that the lab result is normal and no further action needed ______ 

## 2013-07-18 DIAGNOSIS — M25469 Effusion, unspecified knee: Secondary | ICD-10-CM | POA: Diagnosis not present

## 2013-07-20 DIAGNOSIS — Z1231 Encounter for screening mammogram for malignant neoplasm of breast: Secondary | ICD-10-CM | POA: Diagnosis not present

## 2013-07-23 ENCOUNTER — Encounter: Payer: Self-pay | Admitting: Gynecology

## 2013-10-02 ENCOUNTER — Other Ambulatory Visit: Payer: Medicare Other

## 2013-10-05 ENCOUNTER — Ambulatory Visit (INDEPENDENT_AMBULATORY_CARE_PROVIDER_SITE_OTHER): Payer: Medicare Other | Admitting: Endocrinology

## 2013-10-05 ENCOUNTER — Encounter: Payer: Self-pay | Admitting: Endocrinology

## 2013-10-05 ENCOUNTER — Other Ambulatory Visit (INDEPENDENT_AMBULATORY_CARE_PROVIDER_SITE_OTHER): Payer: Medicare Other

## 2013-10-05 VITALS — BP 106/60 | HR 74 | Temp 98.0°F | Resp 16 | Ht 64.0 in | Wt 156.6 lb

## 2013-10-05 DIAGNOSIS — E049 Nontoxic goiter, unspecified: Secondary | ICD-10-CM

## 2013-10-05 DIAGNOSIS — Z862 Personal history of diseases of the blood and blood-forming organs and certain disorders involving the immune mechanism: Secondary | ICD-10-CM

## 2013-10-05 DIAGNOSIS — R946 Abnormal results of thyroid function studies: Secondary | ICD-10-CM | POA: Diagnosis not present

## 2013-10-05 DIAGNOSIS — R7301 Impaired fasting glucose: Secondary | ICD-10-CM | POA: Diagnosis not present

## 2013-10-05 DIAGNOSIS — E785 Hyperlipidemia, unspecified: Secondary | ICD-10-CM

## 2013-10-05 DIAGNOSIS — I10 Essential (primary) hypertension: Secondary | ICD-10-CM | POA: Diagnosis not present

## 2013-10-05 DIAGNOSIS — R7989 Other specified abnormal findings of blood chemistry: Secondary | ICD-10-CM

## 2013-10-05 LAB — COMPREHENSIVE METABOLIC PANEL
ALT: 21 U/L (ref 0–35)
AST: 24 U/L (ref 0–37)
Albumin: 4.1 g/dL (ref 3.5–5.2)
Alkaline Phosphatase: 41 U/L (ref 39–117)
BILIRUBIN TOTAL: 1 mg/dL (ref 0.2–1.2)
BUN: 15 mg/dL (ref 6–23)
CO2: 27 meq/L (ref 19–32)
Calcium: 9.9 mg/dL (ref 8.4–10.5)
Chloride: 105 mEq/L (ref 96–112)
Creatinine, Ser: 0.9 mg/dL (ref 0.4–1.2)
GFR: 67.23 mL/min (ref >60.00)
GLUCOSE: 94 mg/dL (ref 70–99)
Potassium: 4.1 mEq/L (ref 3.5–5.1)
SODIUM: 139 meq/L (ref 135–145)
TOTAL PROTEIN: 6.9 g/dL (ref 6.0–8.3)

## 2013-10-05 LAB — LIPID PANEL
CHOL/HDL RATIO: 3
CHOLESTEROL: 145 mg/dL (ref 0–200)
HDL: 48.9 mg/dL (ref >39.00)
LDL Cholesterol: 73 mg/dL (ref 0–99)
NonHDL: 96.1
Triglycerides: 116 mg/dL (ref 0.0–149.0)
VLDL: 23.2 mg/dL (ref 0.0–40.0)

## 2013-10-05 LAB — TSH: TSH: 0.29 u[IU]/mL — AB (ref 0.35–4.50)

## 2013-10-05 LAB — HEMOGLOBIN A1C: Hgb A1c MFr Bld: 6.3 % (ref 4.6–6.5)

## 2013-10-05 NOTE — Progress Notes (Signed)
Patient ID: Barbara Maxwell, female   DOB: 1935/09/27, 78 y.o.   MRN: [id number]   Chief complaint: Followup of multiple problems  History of Present Illness:   PROBLEM 1:  High lipids since 1980s. Highest Direct LDL 209.  Initially given Lipitor in 2001. Was well controlled with Lipitor 20 mg and no side effects. Had myopathy with Zocor.  However LDL had increased to 154 and was changed to Crestor in 6/04.  This was increased to 40 mg because of LDL particle number of 2343.  Zetia added in 2/09 also and LDL particle number was 1289 in 12/13.  Triglycerides have been mildly high and HDL previously slightly low. Usually watching diet for saturated fats.  Lipids are pending from today   Lab Results  Component Value Date   CHOL 141 02/05/2013   HDL 42.00 02/05/2013   LDLCALC 69 02/05/2013   TRIG 149.0 02/05/2013   CHOLHDL 3 02/05/2013    PROBLEM 2:  High blood pressure treated since 1990 and usually well controlled.  On 12.5 mg Toprol and 80 mg valsartan   Lozol stopped in 9/01 because of hypotension. Blood pressure at home 116-120/60, no lightheadedness  PROBLEM 3: History of anemia.  Hb 11.1 in 1/14 associated with iron deficiency. She has been taking iron supplements. Hemoccult negative in 4/15  Lab Results  Component Value Date   WBC 6.4 06/05/2013   HGB 13.6 06/05/2013   HCT 40.7 06/05/2013   MCV 88.6 06/05/2013   PLT 276.0 06/05/2013       Medication List       This list is accurate as of: 10/05/13  9:05 AM.  Always use your most recent med list.               acetaminophen 325 MG tablet  Commonly known as:  TYLENOL  Take 650 mg by mouth every 6 (six) hours as needed for pain.     aspirin 81 MG tablet  Take 81 mg by mouth daily.     cholecalciferol 400 UNITS Tabs tablet  Commonly known as:  VITAMIN D  Take 5,000 Units by mouth.     Estradiol 10 MCG Tabs vaginal tablet  Commonly known as:  VAGIFEM  Place 1 tablet (10 mcg total) vaginally 2 (two) times a  week.     ezetimibe 10 MG tablet  Commonly known as:  ZETIA  Take 1 tablet (10 mg total) by mouth daily.     ferrous sulfate 325 (65 FE) MG tablet  Take 325 mg by mouth daily with breakfast.     ibuprofen 200 MG tablet  Commonly known as:  ADVIL,MOTRIN  Take 200 mg by mouth every 6 (six) hours as needed for pain.     metoprolol succinate 25 MG 24 hr tablet  Commonly known as:  TOPROL-XL  1/2 tablet daily     rosuvastatin 40 MG tablet  Commonly known as:  CRESTOR  Take 1 tablet (40 mg total) by mouth daily.     sertraline 25 MG tablet  Commonly known as:  ZOLOFT  Take 1 tablet (25 mg total) by mouth daily.     valsartan 80 MG tablet  Commonly known as:  DIOVAN  Take 1 tablet (80 mg total) by mouth daily.     vitamin C 500 MG tablet  Commonly known as:  ASCORBIC ACID  Take 500 mg by mouth daily.        Allergies:  Allergies  Allergen Reactions  . Simvastatin  Myopathy  . Codeine Nausea Only  . Evista [Raloxifene]     Hot flashes  . Percocet [Oxycodone-Acetaminophen] Nausea And Vomiting    Nausea and vomiting    Past Medical History  Diagnosis Date  . Depression   . Hypertension   . Hypercholesterolemia   . Cancer     SMALL PLACE-  MELANOMA REMOVED FROM BACK    Episode of severe anxiety/amnesia 10/08.  Past Surgical History  Procedure Laterality Date  . Tonsillectomy and adenoidectomy    . Appendectomy    . Abdominal hysterectomy      VAGINAL HYSTERECTOMY WITH OVARIAN PRESERVATION   . Breast surgery  1985    BREAST REDUCTION   . Abdominoplasty  1998    Family History  Problem Relation Age of Onset  . Hypertension Mother   . Heart disease Father   . Diabetes Father   . Cancer Sister 71    OVARIAN   . Thyroid disease Sister   . Heart disease Brother   . Thyroid disease Daughter   . Diabetes Paternal Grandmother      Social History:  reports that she has never smoked. She has never used smokeless tobacco. Her alcohol and drug histories  are not on file.  Review of Systems -   History of mild impaired fasting glucose, glucose is normal fasting in the last few checks and A1c normal also    Wt Readings from Last 3 Encounters:  10/05/13 156 lb 9.6 oz (71.033 kg)  06/08/13 155 lb 12.8 oz (70.67 kg)  02/05/13 162 lb 3.2 oz (73.573 kg)   Lab Results  Component Value Date   HGBA1C 6.1 02/05/2013      Vitamin D deficiency: The baseline level was 20. The last level was 34 in 12/14 and she is now compliant with her 2000 units daily    EXAM:  BP 126/69  Pulse 74  Temp(Src) 98 F (36.7 C)  Resp 16  Ht 5\' 4"  (1.626 m)  Wt 156 lb 9.6 oz (71.033 kg)  BMI 26.87 kg/m2  SpO2 98%   Standing blood pressure 106/60  No peripheral edema  Assessment/Plan:   1. HYPERTENSION: Blood pressure is low normal especially on standing with 80 mg Diovan and low dose Toprol. She will stop the Diovan and watch her blood pressure, given parameters for restarting the dose    2. HYPERLIPIDEMIA: Well controlled previously with last LDL below 70 and triglycerides below 150 as well as reasonably good HDL of 42 History of increased LDL particle number. Lipid panel will be checked today  3.  Impaired fasting glucose; highest 108. More recently has been normal. A1c and glucose more recently normal    Barbara Maxwell 10/05/2013, 9:05 AM   Labs: Within desired range, minimal decrease in TSH, will follow with free T4 on the next visit  Appointment on 10/05/2013  Component Date Value Ref Range Status  . Hemoglobin A1C 10/05/2013 6.3  4.6 - 6.5 % Final   Glycemic Control Guidelines for People with Diabetes:Non Diabetic:  <6%Goal of Therapy: <7%Additional Action Suggested:  >8%   . Sodium 10/05/2013 139  135 - 145 mEq/L Final  . Potassium 10/05/2013 4.1  3.5 - 5.1 mEq/L Final  . Chloride 10/05/2013 105  96 - 112 mEq/L Final  . CO2 10/05/2013 27  19 - 32 mEq/L Final  . Glucose, Bld 10/05/2013 94  70 - 99 mg/dL Final  . BUN 10/05/2013 15  6 - 23  mg/dL Final  . Creatinine,  Ser 10/05/2013 0.9  0.4 - 1.2 mg/dL Final  . Total Bilirubin 10/05/2013 1.0  0.2 - 1.2 mg/dL Final  . Alkaline Phosphatase 10/05/2013 41  39 - 117 U/L Final  . AST 10/05/2013 24  0 - 37 U/L Final  . ALT 10/05/2013 21  0 - 35 U/L Final  . Total Protein 10/05/2013 6.9  6.0 - 8.3 g/dL Final  . Albumin 10/05/2013 4.1  3.5 - 5.2 g/dL Final  . Calcium 10/05/2013 9.9  8.4 - 10.5 mg/dL Final  . GFR 10/05/2013 67.23  >60.00 mL/min Final  . Cholesterol 10/05/2013 145  0 - 200 mg/dL Final   ATP III Classification       Desirable:  < 200 mg/dL               Borderline High:  200 - 239 mg/dL          High:  > = 240 mg/dL  . Triglycerides 10/05/2013 116.0  0.0 - 149.0 mg/dL Final   Normal:  <150 mg/dLBorderline High:  150 - 199 mg/dL  . HDL 10/05/2013 48.90  >39.00 mg/dL Final  . VLDL 10/05/2013 23.2  0.0 - 40.0 mg/dL Final  . LDL Cholesterol 10/05/2013 73  0 - 99 mg/dL Final  . Total CHOL/HDL Ratio 10/05/2013 3   Final                  Men          Women1/2 Average Risk     3.4          3.3Average Risk          5.0          4.42X Average Risk          9.6          7.13X Average Risk          15.0          11.0                      . NonHDL 10/05/2013 96.10   Final   NOTE:  Non-HDL goal should be 30 mg/dL higher than patient's LDL goal (i.e. LDL goal of < 70 mg/dL, would have non-HDL goal of < 100 mg/dL)  . TSH 10/05/2013 0.29* 0.35 - 4.50 uIU/mL Final

## 2013-10-05 NOTE — Patient Instructions (Signed)
Leave off Valsartan and restart only BP over 140 on top or 85 on bottom

## 2013-10-06 LAB — LIPOPROTEIN ANALYSIS BY NMR
HDL PARTICLE NUMBER: 41.8 umol/L (ref >=30.5)
LDL Particle Number: 1189 nmol/L — ABNORMAL HIGH (ref <1000)
LDL SIZE: 20.3 nm (ref >20.5)
LP-IR Score: 61 — ABNORMAL HIGH (ref <=45)
SMALL LDL PARTICLE NUMBER: 763 nmol/L — AB (ref <=527)

## 2013-10-07 NOTE — Progress Notes (Signed)
Quick Note:  Overall labs are good, no change needed ______

## 2013-10-19 ENCOUNTER — Ambulatory Visit (INDEPENDENT_AMBULATORY_CARE_PROVIDER_SITE_OTHER): Payer: Medicare Other | Admitting: Podiatrist

## 2013-10-19 ENCOUNTER — Encounter: Payer: Self-pay | Admitting: Podiatrist

## 2013-10-19 VITALS — BP 130/74 | HR 76 | Resp 18

## 2013-10-19 DIAGNOSIS — L03039 Cellulitis of unspecified toe: Secondary | ICD-10-CM | POA: Diagnosis not present

## 2013-10-19 MED ORDER — EFINACONAZOLE 10 % EX SOLN: 1.0000 [drp] | Freq: Every day | CUTANEOUS | Status: DC

## 2013-10-19 NOTE — Patient Instructions (Signed)
ANTIBACTERIAL SOAP INSTRUCTIONS  THE DAY AFTER PROCEDURE   For the first dressing change (soak) Place 3-4 drops of antibacterial liquid soap in a quart of warm tap water.  Submerge foot into water for 20 minutes.  If bandage was applied after your procedure, leave on to allow for easy lift off, then remove and continue with soak for the remaining time.  Next, blot area dry with a soft cloth and apply antibiotic ointment such as polysporin, neosporin, or triple antibiotic ointment.  You may then switch to the instructions below    Shower as usual. Before getting out, place a drop of antibacterial liquid soap (Dial) on a wet, clean washcloth.  Gently wipe washcloth over affected area.  Afterward, rinse the area with warm water.  Blot the area dry with a soft cloth and cover with antibiotic ointment (neosporin, polysporin, bacitracin) and band aid or gauze and tape   KERASAL is the topical antifungal you can get over the counter at the drug store-- apply to the toenails daily.

## 2013-10-19 NOTE — Progress Notes (Signed)
   Subjective:    Patient ID: Barbara Maxwell, female    DOB: [birth year], 78 y.o.   MRN: [id number]  HPI MY 2ND TOE AND THE 3RD TOE ON THE LEFT IS RED AND THROBS AND STINGS AND BURNS AND I HAVE SOAKED IT AND IT MAY BE SOME TOENAIL FUNGUS GOING ON AND MY DAUGHTER PUT A PIECE OF COTTON UNDER IT    Review of Systems  HENT: Positive for hearing loss.   Musculoskeletal: Positive for back pain.  Skin:       CHANGE IN NAILS  Allergic/Immunologic: Positive for environmental allergies.       BEE STINGS  Psychiatric/Behavioral: The patient is nervous/anxious.   All other systems reviewed and are negative.      Objective:   Physical Exam Patient is awake, alert, and oriented x 3.  In no acute distress.  Vascular status is intact with palpable pedal pulses at 2/4 DP and PT bilateral and capillary refill time within normal limits. Neurological sensation is also intact bilaterally via Semmes Weinstein monofilament at 5/5 sites. Light touch, vibratory sensation, Achilles tendon reflex is intact. Dermatological exam reveals skin color, turger and texture as normal. No open lesions present.  Musculature intact with dorsiflexion, plantarflexion, inversion, eversion.  Incurvated ingrown medial border of the 2nd toe is present left foot.  No pus or pirulence but incurvated border with hypertrophy of the nail border is present.     Assessment & Plan:   Ingrown 2nd toenail  Plan:  Treatment options and alternatives discussed.  Recommended permanent phenol matrixectomy and patient agreed.  Left 2nd toe was prepped with alcohol and a 1 to 1 mix of 0.5% marcaine plain and 2% lidocaine plain was administered in a digital block fashion.  The toe was then prepped with betadine solution and exsanguinated.  The offending nail border was then excised and matrix tissue exposed.  Phenol was then applied to the matrix tissue followed by an alcohol wash.  Antibiotic ointment and a dry sterile dressing was applied.  The  patient was dispensed instructions for aftercare.

## 2013-11-30 ENCOUNTER — Other Ambulatory Visit: Payer: Self-pay | Admitting: *Deleted

## 2013-11-30 ENCOUNTER — Ambulatory Visit: Payer: Medicare Other

## 2013-11-30 DIAGNOSIS — Z23 Encounter for immunization: Secondary | ICD-10-CM

## 2013-12-04 DIAGNOSIS — L821 Other seborrheic keratosis: Secondary | ICD-10-CM | POA: Diagnosis not present

## 2013-12-04 DIAGNOSIS — L814 Other melanin hyperpigmentation: Secondary | ICD-10-CM | POA: Diagnosis not present

## 2013-12-04 DIAGNOSIS — Z85820 Personal history of malignant melanoma of skin: Secondary | ICD-10-CM | POA: Diagnosis not present

## 2013-12-04 DIAGNOSIS — L570 Actinic keratosis: Secondary | ICD-10-CM | POA: Diagnosis not present

## 2013-12-04 DIAGNOSIS — L720 Epidermal cyst: Secondary | ICD-10-CM | POA: Diagnosis not present

## 2013-12-04 DIAGNOSIS — D1801 Hemangioma of skin and subcutaneous tissue: Secondary | ICD-10-CM | POA: Diagnosis not present

## 2013-12-24 ENCOUNTER — Encounter: Payer: Self-pay | Admitting: Podiatrist

## 2013-12-31 DIAGNOSIS — M25461 Effusion, right knee: Secondary | ICD-10-CM | POA: Diagnosis not present

## 2014-01-14 ENCOUNTER — Ambulatory Visit (INDEPENDENT_AMBULATORY_CARE_PROVIDER_SITE_OTHER): Payer: Medicare Other | Admitting: Gynecology

## 2014-01-14 ENCOUNTER — Encounter: Payer: Self-pay | Admitting: Gynecology

## 2014-01-14 VITALS — BP 126/74 | Ht 62.5 in | Wt 156.0 lb

## 2014-01-14 DIAGNOSIS — Z8639 Personal history of other endocrine, nutritional and metabolic disease: Secondary | ICD-10-CM | POA: Diagnosis not present

## 2014-01-14 DIAGNOSIS — Z8041 Family history of malignant neoplasm of ovary: Secondary | ICD-10-CM | POA: Diagnosis not present

## 2014-01-14 DIAGNOSIS — M8580 Other specified disorders of bone density and structure, unspecified site: Secondary | ICD-10-CM | POA: Diagnosis not present

## 2014-01-14 DIAGNOSIS — N816 Rectocele: Secondary | ICD-10-CM

## 2014-01-14 DIAGNOSIS — N952 Postmenopausal atrophic vaginitis: Secondary | ICD-10-CM | POA: Diagnosis not present

## 2014-01-14 NOTE — Patient Instructions (Signed)
CA-125 Tumor Marker CA 125 is a tumor marker that is used to help monitor the course of ovarian or endometrial cancer. PREPARATION FOR TEST No preparation is necessary. NORMAL FINDINGS Adults: 0-35 units/mL (0-35 kilounits)/L Ranges for normal findings may vary among different laboratories and hospitals. You should always check with your doctor after having lab work or other tests done to discuss the meaning of your test results and whether your values are considered within normal limits. MEANING OF TEST  Your caregiver will go over the test results with you and discuss the importance and meaning of your results, as well as treatment options and the need for additional tests if necessary. OBTAINING THE TEST RESULTS It is your responsibility to obtain your test results. Ask the lab or department performing the test when and how you will get your results. Document Released: 03/02/2004 Document Revised: 05/03/2011 Document Reviewed: 01/17/2008 [facility] Patient Information 2015 [place], [place]. This information is not intended to replace advice given to you by your health care provider. Make sure you discuss any questions you have with your health care provider.

## 2014-01-14 NOTE — Progress Notes (Signed)
Barbara Maxwell [birth year] [id number]   History:    78 y.o.  who presented to the office for GYN exam and follow-up. Patient was seen in the office for the first time as a new patient last year since she was a former patient of Dr. Ubaldo Glassing. Patient many years ago had a history of transvaginal hysterectomy secondary to fibroids and menorrhagia. Patient's sister was diagnosed with ovarian cancer at the age of 51 and for this reason last year we did an ultrasound and a CA-125 for annual screening.Her colonoscopy was normal in 2006 done by Dr. Earlean Shawl. Patient with no previous history of abnormal Pap smears. Mammogram May of this year was normal. Patient's last bone density study was in 2014 and the lowest T score was at the left femoral neck with a value -1.1 her Frax analysis indicated she had a 6.7% risk of hip fracture. She is currently only taking calcium and vitamin D. She has had history vitamin D deficiency in the past.  Patient with history of melanoma in her back in 2001 had been followed by dermatologist Dr. Otho Bellows and a wide excision done by Dr. Craig Staggers. Patient also has had history of breast reduction as well as abdominoplasty several years ago. Patient has had the shinglkes and Tdap vaccine are up-to-date. Patient is on Vagifem 10 g tablet twice a week for vaginal atrophy and has done well.  Dr. Dwyane Dee is her PCP and has been doing her blood work  Past medical history,surgical history, family history and social history were all reviewed and documented in the EPIC chart.  Gynecologic History No LMP recorded. Patient is postmenopausal. Contraception: status post hysterectomy Last Pap: 2011. Results were: normal Last mammogram: 2015. Results were: normal  Obstetric History OB History  Gravida Para Term Preterm AB SAB TAB Ectopic Multiple Living  4 4        4     # Outcome Date GA Lbr Len/2nd Weight Sex Delivery Anes PTL Lv  4 Para           3 Para           2 Para           1 Para                 ROS: A ROS was performed and pertinent positives and negatives are included in the history.  GENERAL: No fevers or chills. HEENT: No change in vision, no earache, sore throat or sinus congestion. NECK: No pain or stiffness. CARDIOVASCULAR: No chest pain or pressure. No palpitations. PULMONARY: No shortness of breath, cough or wheeze. GASTROINTESTINAL: No abdominal pain, nausea, vomiting or diarrhea, melena or bright red blood per rectum. GENITOURINARY: No urinary frequency, urgency, hesitancy or dysuria. MUSCULOSKELETAL: No joint or muscle pain, no back pain, no recent trauma. DERMATOLOGIC: No rash, no itching, no lesions. ENDOCRINE: No polyuria, polydipsia, no heat or cold intolerance. No recent change in weight. HEMATOLOGICAL: No anemia or easy bruising or bleeding. NEUROLOGIC: No headache, seizures, numbness, tingling or weakness. PSYCHIATRIC: No depression, no loss of interest in normal activity or change in sleep pattern.     Exam: chaperone present  BP 126/74 mmHg  Ht 5' 2.5" (1.588 m)  Wt 156 lb (70.761 kg)  BMI 28.06 kg/m2  Body mass index is 28.06 kg/(m^2).  General appearance : Well developed well nourished female. No acute distress HEENT: Neck supple, trachea midline, no carotid bruits, no thyroidmegaly Lungs: Clear to auscultation, no rhonchi or wheezes,  or rib retractions  Heart: Regular rate and rhythm, no murmurs or gallops Breast:Examined in sitting and supine position were symmetrical in appearance, no palpable masses or tenderness,  no skin retraction, no nipple inversion, no nipple discharge, no skin discoloration, no axillary or supraclavicular lymphadenopathy Abdomen: no palpable masses or tenderness, no rebound or guarding Extremities: no edema or skin discoloration or tenderness  Pelvic:  Bartholin, Urethra, Skene Glands: Within normal limits             Vagina: No gross lesions or discharge, first-degree rectocele  Cervix: Absent  Uterus   absent  Adnexa  Without masses or tenderness  Anus and perineum  normal   Rectovaginal  normal sphincter tone without palpated masses or tenderness             Hemoccult PCP provides     Assessment/Plan:  78 y.o. female doing well on Vagifem 10 g tablet intravaginally twice a week for vaginal atrophy. Patient's sister at the age of 5 with ovarian cancer. We discussed limitations of some cramping but we'll do a CA-125 along with pelvic ultrasound yearly. Pap smear not indicated according to the new guidelines. We'll check a vitamin D level today and she will need a bone density study next year. We discussed importance of calcium vitamin D and regular exercise for osteoporosis prevention. We discussed importance of monthly breast exam.   Terrance Mass MD, 3:07 PM 01/14/2014

## 2014-01-15 LAB — CA 125: CA 125: 7 U/mL (ref <35)

## 2014-01-15 LAB — VITAMIN D 25 HYDROXY (VIT D DEFICIENCY, FRACTURES): Vit D, 25-Hydroxy: 39 ng/mL (ref 30–100)

## 2014-01-17 ENCOUNTER — Other Ambulatory Visit: Payer: Self-pay | Admitting: Endocrinology

## 2014-01-21 ENCOUNTER — Other Ambulatory Visit: Payer: Self-pay | Admitting: *Deleted

## 2014-01-21 MED ORDER — SERTRALINE HCL 25 MG PO TABS: 25.0000 mg | ORAL_TABLET | Freq: Every day | ORAL | Status: DC

## 2014-02-01 ENCOUNTER — Ambulatory Visit (INDEPENDENT_AMBULATORY_CARE_PROVIDER_SITE_OTHER): Payer: Medicare Other

## 2014-02-01 ENCOUNTER — Encounter: Payer: Self-pay | Admitting: Gynecology

## 2014-02-01 ENCOUNTER — Ambulatory Visit (INDEPENDENT_AMBULATORY_CARE_PROVIDER_SITE_OTHER): Payer: Medicare Other | Admitting: Gynecology

## 2014-02-01 DIAGNOSIS — Z8041 Family history of malignant neoplasm of ovary: Secondary | ICD-10-CM

## 2014-02-01 NOTE — Progress Notes (Signed)
   Patient presented to the office today for ultrasound. Patient was seen the office for annual gynecological examination on 01/14/2014. Patient was seen in the office for the first time as a new patient in 2013.Patient many years ago had a history of transvaginal hysterectomy secondary to fibroids and menorrhagia. Patient's sister was diagnosed with ovarian cancer at the age of 13 and for this reason last year we did an ultrasound and a CA-125 for annual screening.  Ultrasound 2014: Absent uterus small right avascular cyst measuring 18 x 13 mm    CA 125  5.7  Ultrasound 2015 right ovarian echo-free thinwall avascular cyst 10 x 9 x 7 mm decreased in size. No fluid in the cul-de-sac. CA 125   7  Patient's colonoscopy and mammogram are all up-to-date. Patient doing well otherwise. Will return back in one year for annual exam or when necessary.

## 2014-02-04 ENCOUNTER — Ambulatory Visit (INDEPENDENT_AMBULATORY_CARE_PROVIDER_SITE_OTHER): Payer: Medicare Other | Admitting: Endocrinology

## 2014-02-04 ENCOUNTER — Encounter: Payer: Self-pay | Admitting: Endocrinology

## 2014-02-04 ENCOUNTER — Other Ambulatory Visit: Payer: Self-pay | Admitting: *Deleted

## 2014-02-04 VITALS — BP 118/62 | HR 71 | Temp 98.2°F | Resp 14 | Ht 62.5 in | Wt 156.6 lb

## 2014-02-04 DIAGNOSIS — R946 Abnormal results of thyroid function studies: Secondary | ICD-10-CM | POA: Diagnosis not present

## 2014-02-04 DIAGNOSIS — E049 Nontoxic goiter, unspecified: Secondary | ICD-10-CM

## 2014-02-04 DIAGNOSIS — Z23 Encounter for immunization: Secondary | ICD-10-CM

## 2014-02-04 DIAGNOSIS — Z862 Personal history of diseases of the blood and blood-forming organs and certain disorders involving the immune mechanism: Secondary | ICD-10-CM | POA: Diagnosis not present

## 2014-02-04 DIAGNOSIS — E782 Mixed hyperlipidemia: Secondary | ICD-10-CM

## 2014-02-04 DIAGNOSIS — I10 Essential (primary) hypertension: Secondary | ICD-10-CM | POA: Diagnosis not present

## 2014-02-04 DIAGNOSIS — R7989 Other specified abnormal findings of blood chemistry: Secondary | ICD-10-CM

## 2014-02-04 LAB — BASIC METABOLIC PANEL
BUN: 14 mg/dL (ref 6–23)
CO2: 27 meq/L (ref 19–32)
Calcium: 9.3 mg/dL (ref 8.4–10.5)
Chloride: 106 mEq/L (ref 96–112)
Creatinine, Ser: 0.8 mg/dL (ref 0.4–1.2)
GFR: 74 mL/min (ref >60.00)
GLUCOSE: 85 mg/dL (ref 70–99)
Potassium: 3.8 mEq/L (ref 3.5–5.1)
SODIUM: 140 meq/L (ref 135–145)

## 2014-02-04 LAB — CBC
HEMATOCRIT: 41.3 % (ref 36.0–46.0)
HEMOGLOBIN: 13.6 g/dL (ref 12.0–15.0)
MCHC: 33 g/dL (ref 30.0–36.0)
MCV: 86.8 fl (ref 78.0–100.0)
Platelets: 270 10*3/uL (ref 150.0–400.0)
RBC: 4.76 Mil/uL (ref 3.87–5.11)
RDW: 13.6 % (ref 11.5–15.5)
WBC: 6.7 10*3/uL (ref 4.0–10.5)

## 2014-02-04 LAB — TSH: TSH: 0.56 u[IU]/mL (ref 0.35–4.50)

## 2014-02-04 LAB — T4, FREE: Free T4: 0.96 ng/dL (ref 0.60–1.60)

## 2014-02-04 NOTE — Progress Notes (Signed)
Quick Note:  Please let patient know that the lab tests are normal and no further action needed ______

## 2014-02-04 NOTE — Progress Notes (Signed)
Patient ID: Barbara Maxwell, female   DOB: 1935/12/21, 78 y.o.   MRN: [id number]    Chief complaint: Followup of multiple problems   History of Present Illness:    PROBLEM 1:  High blood pressure, previously treated since 1990 with various regimens. She was on 12.5 mg Toprol and 80 mg valsartan Since her blood pressure was low normal in 09/2013 her medications were stopped and she was told to follow her blood pressure at home.  She thinks her blood pressure has been normal and she has not had any palpitations either Blood pressure appears to be still well controlled  PROBLEM 2: History of anemia.  Hb 11.1 in 1/14 associated with iron deficiency. She has been taking iron supplements. Hemoccult negative in 4/15  Lab Results  Component Value Date   WBC 6.4 06/05/2013   HGB 13.6 06/05/2013   HCT 40.7 06/05/2013   MCV 88.6 06/05/2013   PLT 276.0 06/05/2013   She has had a negative colonoscopy also in the past   PROBLEM 3:  High lipids since 1980s. Highest Direct LDL 209.  Initially given Lipitor in 2001. Was well controlled with Lipitor 20 mg and no side effects. Had myopathy with Zocor.  However LDL had increased to 154 and was changed to Crestor in 6/04.  This was increased to 40 mg because of LDL particle number of 2343.  Zetia added in 2/09 also and LDL particle number was 1289 in 12/13.  Triglycerides have been mildly high and HDL previously slightly low. Usually watching diet for saturated fats.  Lipids are as follows   Lab Results  Component Value Date   CHOL 145 10/05/2013   HDL 48.90 10/05/2013   LDLCALC 73 10/05/2013   TRIG 116.0 10/05/2013   CHOLHDL 3 10/05/2013         Medication List       This list is accurate as of: 02/04/14 10:12 AM.  Always use your most recent med list.               acetaminophen 325 MG tablet  Commonly known as:  TYLENOL  Take 650 mg by mouth every 6 (six) hours as needed for pain.     aspirin 81 MG tablet  Take 81 mg by mouth  daily.     cholecalciferol 400 UNITS Tabs tablet  Commonly known as:  VITAMIN D  Take 5,000 Units by mouth.     Efinaconazole 10 % Soln  Apply 1 drop topically daily.     Estradiol 10 MCG Tabs vaginal tablet  Commonly known as:  VAGIFEM  Place 1 tablet (10 mcg total) vaginally 2 (two) times a week.     ezetimibe 10 MG tablet  Commonly known as:  ZETIA  Take 1 tablet (10 mg total) by mouth daily.     ferrous sulfate 325 (65 FE) MG tablet  Take 325 mg by mouth daily with breakfast.     ibuprofen 200 MG tablet  Commonly known as:  ADVIL,MOTRIN  Take 200 mg by mouth every 6 (six) hours as needed for pain.     rosuvastatin 40 MG tablet  Commonly known as:  CRESTOR  Take 1 tablet (40 mg total) by mouth daily.     sertraline 25 MG tablet  Commonly known as:  ZOLOFT  Take 1 tablet (25 mg total) by mouth daily.     vitamin C 500 MG tablet  Commonly known as:  ASCORBIC ACID  Take 500 mg by mouth  daily.        Allergies:  Allergies  Allergen Reactions  . Simvastatin     Myopathy  . Codeine Nausea Only  . Evista [Raloxifene]     Hot flashes  . Percocet [Oxycodone-Acetaminophen] Nausea And Vomiting    Nausea and vomiting    Past Medical History  Diagnosis Date  . Depression   . Hypertension   . Hypercholesterolemia   . Cancer     SMALL PLACE-  MELANOMA REMOVED FROM BACK    Episode of severe anxiety/amnesia 10/08.  Past Surgical History  Procedure Laterality Date  . Tonsillectomy and adenoidectomy    . Appendectomy    . Abdominal hysterectomy      VAGINAL HYSTERECTOMY WITH OVARIAN PRESERVATION   . Breast surgery  1985    BREAST REDUCTION   . Abdominoplasty  1998    Family History  Problem Relation Age of Onset  . Hypertension Mother   . Heart disease Father   . Diabetes Father   . Cancer Sister 74    OVARIAN   . Thyroid disease Sister   . Heart disease Brother   . Thyroid disease Daughter   . Diabetes Paternal Grandmother      Social History:   reports that she has never smoked. She has never used smokeless tobacco. Her alcohol and drug histories are not on file.  Review of Systems -    She had problems with osteoarthritis of her knees and was given a steroid injection by her orthopedic surgeon  History of mild impaired fasting glucose, glucose is normal fasting in the last few checks and A1c normal also  No recent weight gain.  Wt Readings from Last 3 Encounters:  02/04/14 156 lb 9.6 oz (71.033 kg)  01/14/14 156 lb (70.761 kg)  10/05/13 156 lb 9.6 oz (71.033 kg)   Lab Results  Component Value Date   HGBA1C 6.3 10/05/2013      Vitamin D deficiency: The baseline level was 20. The last level was normal checked by gynecologist in 11/15 She has been taking 2000 units daily    EXAM:  Pulse 71  Ht 5' 2.5" (1.588 m)  Wt 156 lb 9.6 oz (71.033 kg)  BMI 28.17 kg/m2  SpO2 99%  She has a smooth soft 2-2.5 times enlarged right thyroid gland, minimally palpable on the left.  No lymphadenopathy in the neck  Assessment/Plan:   Prevnar  1. HYPERTENSION: Blood pressure is still normal without any medications She will continue to monitor periodically at home  2.  GOITER: Her last TSH was low normal and will reevaluate this.  Clinically she looks euthyroid  3.  Impaired fasting glucose; highest 108. More recently fasting glucose  has been normal and repeat level done today  4.  Vitamin D deficiency: Adequately replaced.  She has minimal osteopenia and gynecologist is going to check her bone density next year  5.  Preventive care: She has not had Prevnar and will do this today    Jarnell Cordaro 02/04/2014, 10:12 AM

## 2014-06-05 ENCOUNTER — Ambulatory Visit: Payer: Medicare Other | Admitting: Endocrinology

## 2014-06-05 ENCOUNTER — Other Ambulatory Visit (INDEPENDENT_AMBULATORY_CARE_PROVIDER_SITE_OTHER): Payer: Medicare Other

## 2014-06-05 DIAGNOSIS — E782 Mixed hyperlipidemia: Secondary | ICD-10-CM | POA: Diagnosis not present

## 2014-06-05 LAB — COMPREHENSIVE METABOLIC PANEL
ALBUMIN: 3.9 g/dL (ref 3.5–5.2)
ALK PHOS: 51 U/L (ref 39–117)
ALT: 15 U/L (ref 0–35)
AST: 20 U/L (ref 0–37)
BILIRUBIN TOTAL: 0.8 mg/dL (ref 0.2–1.2)
BUN: 13 mg/dL (ref 6–23)
CHLORIDE: 105 meq/L (ref 96–112)
CO2: 27 mEq/L (ref 19–32)
CREATININE: 0.88 mg/dL (ref 0.40–1.20)
Calcium: 9.5 mg/dL (ref 8.4–10.5)
GFR: 66.23 mL/min (ref >60.00)
Glucose, Bld: 92 mg/dL (ref 70–99)
Potassium: 3.7 mEq/L (ref 3.5–5.1)
Sodium: 140 mEq/L (ref 135–145)
Total Protein: 6.9 g/dL (ref 6.0–8.3)

## 2014-06-05 LAB — LIPID PANEL
CHOLESTEROL: 142 mg/dL (ref 0–200)
HDL: 40.7 mg/dL (ref >39.00)
LDL Cholesterol: 66 mg/dL (ref 0–99)
NonHDL: 101.3
Total CHOL/HDL Ratio: 3
Triglycerides: 178 mg/dL — ABNORMAL HIGH (ref 0.0–149.0)
VLDL: 35.6 mg/dL (ref 0.0–40.0)

## 2014-06-06 ENCOUNTER — Encounter: Payer: Self-pay | Admitting: Endocrinology

## 2014-06-06 ENCOUNTER — Ambulatory Visit (INDEPENDENT_AMBULATORY_CARE_PROVIDER_SITE_OTHER): Payer: Medicare Other | Admitting: Endocrinology

## 2014-06-06 VITALS — BP 130/68 | HR 70 | Temp 97.8°F | Ht 62.5 in | Wt 157.5 lb

## 2014-06-06 DIAGNOSIS — Z0000 Encounter for general adult medical examination without abnormal findings: Secondary | ICD-10-CM

## 2014-06-06 DIAGNOSIS — M545 Low back pain, unspecified: Secondary | ICD-10-CM

## 2014-06-06 DIAGNOSIS — E782 Mixed hyperlipidemia: Secondary | ICD-10-CM | POA: Diagnosis not present

## 2014-06-06 DIAGNOSIS — R002 Palpitations: Secondary | ICD-10-CM | POA: Diagnosis not present

## 2014-06-06 MED ORDER — EZETIMIBE 10 MG PO TABS: 10.0000 mg | ORAL_TABLET | Freq: Every day | ORAL | Status: DC

## 2014-06-06 MED ORDER — ROSUVASTATIN CALCIUM 40 MG PO TABS: 40.0000 mg | ORAL_TABLET | Freq: Every day | ORAL | Status: DC

## 2014-06-06 NOTE — Patient Instructions (Addendum)
Bring copy of living will  Periodic mammograms as discussed Check breasts for lumps monthly with the palm of the hand  Take  2000 units of Vitamin D3 daily and 500 mg of calcium daily  Low saturated fat diet, low intake of sugar and salt  Regular aerobic exercise 5 days a week, add walking  Regular dental and eye exams as recommended by the specialists  Make sure smoke detectors are functional at home Wear seat belts all the time  Check if colonoscopy due this year

## 2014-06-06 NOTE — Progress Notes (Signed)
Patient ID: [name], female   DOB: Jan 01, 1936, 79 y.o.   MRN: [id number]   Subjective:    Patient is being seen today for Medicare annual wellness visit and review of chronic problems.     Risk factors: Advancing age    77 of Physicians Providing Medical Care to Patient:  See "care team section"  Activities of Daily Living:  In the present state of health, the patient has no difficulty performing the following activities:  Preparing food and eating, Getting dressed and taking care of daily personal  needs Patient is able to ambulate without feeling unsteady In the past year the patient has not fallen or had a near fall    Safety: Has smoke detector and wears seat belts. No excess sun exposure.  Diet and Exercise  Current exercise habits: Water aerobics 2/7 days a week, does not like to walk [place]  Dietary issues discussed: Follows a heart healthy diet usually, avoiding high-fat meats Vitamin supplements: She is taking vitamin D3, probably 2000 units, does not know  PREVENTIVE screening/vaccines:  Annual hemoccults:           4/16   Breast self exams:                             periodically   Colonoscopy/sigmoidoscopy  2006  Mammograms: 5/15  Yearly flu vaccine:                      yes   Bone Density:   2014   Calcium supplements:  Tetanus booster:   1998   Zostavax:  2012  Pneumovax:  01/2014     Depression Screen:  Results available in the depression screening section  Advance directives:  She has not brought a copy of her living will  The following portions of the patient's history were reviewed and updated as appropriate: allergies, current medications, past family history, past medical history, past social history, past surgical history and problem list.  Most recent labs available were reviewed    Medication List       This list is accurate as of: 06/06/14  2:20 PM.  Always use your most recent med list.             acetaminophen 325 MG tablet  Commonly known as:  TYLENOL  Take 650 mg by mouth every 6 (six) hours as needed for pain.     aspirin 81 MG tablet  Take 81 mg by mouth daily.     cholecalciferol 400 UNITS Tabs tablet  Commonly known as:  VITAMIN D  Take 5,000 Units by mouth.     Efinaconazole 10 % Soln  Apply 1 drop topically daily.     Estradiol 10 MCG Tabs vaginal tablet  Commonly known as:  VAGIFEM  Place 1 tablet (10 mcg total) vaginally 2 (two) times a week.     ezetimibe 10 MG tablet  Commonly known as:  ZETIA  Take 1 tablet (10 mg total) by mouth daily.     ferrous sulfate 325 (65 FE) MG tablet  Take 325 mg by mouth daily with breakfast.     ibuprofen 200 MG tablet  Commonly known as:  ADVIL,MOTRIN  Take 200 mg by mouth every 6 (six) hours as needed for pain.     meloxicam 15 MG tablet  Commonly known as:  MOBIC     rosuvastatin  40 MG tablet  Commonly known as:  CRESTOR  Take 1 tablet (40 mg total) by mouth daily.     sertraline 25 MG tablet  Commonly known as:  ZOLOFT  Take 1 tablet (25 mg total) by mouth daily.     vitamin C 500 MG tablet  Commonly known as:  ASCORBIC ACID  Take 500 mg by mouth daily.        Allergies:  Allergies  Allergen Reactions  . Simvastatin     Myopathy  . Codeine Nausea Only  . Evista [Raloxifene]     Hot flashes  . Percocet [Oxycodone-Acetaminophen] Nausea And Vomiting    Nausea and vomiting    Past Medical History  Diagnosis Date  . Depression   . Hypertension   . Hypercholesterolemia   . Cancer     SMALL PLACE-  MELANOMA REMOVED FROM BACK     Past Surgical History  Procedure Laterality Date  . Tonsillectomy and adenoidectomy    . Appendectomy    . Abdominal hysterectomy      VAGINAL HYSTERECTOMY WITH OVARIAN PRESERVATION   . Breast surgery  1985    BREAST REDUCTION   . Abdominoplasty  1998    Family History  Problem Relation Age of Onset  . Hypertension Mother   . Heart disease Father   . Diabetes  Father   . Cancer Sister 16    OVARIAN   . Thyroid disease Sister   . Heart disease Brother   . Thyroid disease Daughter   . Diabetes Paternal Grandmother     Social History:  reports that she has never smoked. She has never used smokeless tobacco. Her alcohol and drug histories are not on file.   Review of Systems:   She has mild hearing loss but still functional, and no visual loss  Does not have any changes in appetite or significant weight change  Objective:    BP 130/68 mmHg  Pulse 70  Temp(Src) 97.8 F (36.6 C) (Oral)  Ht 5' 2.5" (1.588 m)  Wt 157 lb 8 oz (71.442 kg)  BMI 28.33 kg/m2  SpO2 97%  Vision:  Normal, has annual eye exams   Hearing: Mildly impaired  Body mass index:  See vitals Neurological/balance: pt easily and quickly performs "get-up-and-go" from a sitting position Cognitive Impairment Assessment: cognition, memory and judgment appear normal.  remembers 3/3 at 5 minutes. Has excellent recall. She is very literate.  She is alert and oriented x 3   Assessment:   Medicare wellness evaluation done   Preventive parameters reviewed    Plan:   During the course of the visit the patient was educated and counseled about appropriate screening and preventive services including:       Fall prevention   Screening mammography  Bone densitometry done by gynecologist every 2 years Diabetes screening shows normal glucose  Nutrition counseling done Exercise regimen reviewed and increased walking suggested Lipid screening done Colorectal screening and process, needs to be scheduled for colonoscopy later this year and she will need to contact gastroenterologist for this  Regular eye and dental exams are being done Aspirin prophylaxis is not indicated  Vaccines / LABS Zostavax / Pneumococcal Vaccine/Prevnar up-to-date She will need tetanus booster, this will be given on the next visit been it will be available  Patient Instructions (the written plan) was  given to the patient.   [facility] 06/06/2014          OFFICE VISIT for medical issues:  Back Pain  This is a new problem. The current episode started in the past 7 days. The problem occurs constantly. The problem is unchanged. The pain is present in the gluteal. The quality of the pain is described as aching. The pain does not radiate. The pain is at a severity of 5/10. The pain is moderate. The pain is worse during the night. The symptoms are aggravated by position and twisting. Pertinent negatives include no tingling or weakness. She has tried analgesics for the symptoms. The treatment provided mild relief.   She has used some tramadol from her husband, has tried Advil without much relief  Exam: She has mild tenderness of the right SI joint but no muscle spasm.  Recommendations: She will avoid bending and stooping as well as unusual activities.  Hold off on water aerobics.  She will try meloxicam half to 1 tablet as needed and call if not improved   Other issues:     HISTORY of hypertension.  Previously on medications including Benicar but this has been stopped subsequent to low normal blood pressure readings.  She checks blood pressure at home periodically and it is usually reading about 125/65  Osteopenia with T score -1.1, followed by gynecologist  ANXIETY/panic attacks.  She has history of episode several years ago and is taking low dose Zoloft with no recurrence.  She sleeps well at night  She was previously on metoprolol partly because of palpitations and this was stopped.  Has only rare palpitations now.  No chest pain.  Will do EKG today to review the rhythm and any other abnormalities  HYPERLIPIDEMIA: This is being managed with Crestor 40 mg and Zetia.  Has mixed hyperlipidemia with significantly increased LDL particle number in the past, baseline 2343.  Not she had muscle pain and weakness with Zocor and did not have enough benefit from Lipitor.  Currently lipid panel shows  excellent LDL and minimally increased triglycerides with low normal HDL.  Have recommended more exercise and efforts to  weight  Lab Results  Component Value Date   CHOL 142 06/05/2014   HDL 40.70 06/05/2014   LDLCALC 66 06/05/2014   TRIG 178.0* 06/05/2014   CHOLHDL 3 06/05/2014

## 2014-06-06 NOTE — Progress Notes (Signed)
Pre visit review using our clinic review tool, if applicable. No additional management support is needed unless otherwise documented below in the visit note. 

## 2014-06-11 ENCOUNTER — Other Ambulatory Visit: Payer: Medicare Other

## 2014-06-11 ENCOUNTER — Telehealth: Payer: Self-pay | Admitting: Endocrinology

## 2014-06-11 DIAGNOSIS — N816 Rectocele: Secondary | ICD-10-CM

## 2014-06-11 NOTE — Telephone Encounter (Signed)
Please put in an IFOB MRI #[id number]

## 2014-06-13 ENCOUNTER — Other Ambulatory Visit: Payer: Self-pay | Admitting: Endocrinology

## 2014-06-13 ENCOUNTER — Other Ambulatory Visit: Payer: Self-pay | Admitting: *Deleted

## 2014-06-13 ENCOUNTER — Other Ambulatory Visit (INDEPENDENT_AMBULATORY_CARE_PROVIDER_SITE_OTHER): Payer: Medicare Other

## 2014-06-13 DIAGNOSIS — Z1211 Encounter for screening for malignant neoplasm of colon: Secondary | ICD-10-CM

## 2014-06-13 NOTE — Telephone Encounter (Signed)
Done

## 2014-06-13 NOTE — Telephone Encounter (Signed)
Patient need ifob order put in

## 2014-06-14 LAB — FECAL OCCULT BLOOD, IMMUNOCHEMICAL: Fecal Occult Bld: NEGATIVE

## 2014-07-17 ENCOUNTER — Encounter (HOSPITAL_COMMUNITY): Payer: Self-pay | Admitting: *Deleted

## 2014-07-17 ENCOUNTER — Emergency Department (HOSPITAL_COMMUNITY): Payer: Medicare Other

## 2014-07-17 ENCOUNTER — Emergency Department (HOSPITAL_COMMUNITY)
Admission: EM | Admit: 2014-07-17 | Discharge: 2014-07-17 | Disposition: A | Discharge status: Home / Self Care | Payer: Medicare Other | Attending: Emergency Medicine | Admitting: Emergency Medicine

## 2014-07-17 DIAGNOSIS — S0181XA Laceration without foreign body of other part of head, initial encounter: Secondary | ICD-10-CM | POA: Diagnosis not present

## 2014-07-17 DIAGNOSIS — S1191XA Laceration without foreign body of unspecified part of neck, initial encounter: Secondary | ICD-10-CM | POA: Diagnosis not present

## 2014-07-17 DIAGNOSIS — E785 Hyperlipidemia, unspecified: Secondary | ICD-10-CM | POA: Insufficient documentation

## 2014-07-17 DIAGNOSIS — Z85828 Personal history of other malignant neoplasm of skin: Secondary | ICD-10-CM | POA: Diagnosis not present

## 2014-07-17 DIAGNOSIS — Y998 Other external cause status: Secondary | ICD-10-CM | POA: Diagnosis not present

## 2014-07-17 DIAGNOSIS — S129XXA Fracture of neck, unspecified, initial encounter: Secondary | ICD-10-CM

## 2014-07-17 DIAGNOSIS — S0990XA Unspecified injury of head, initial encounter: Secondary | ICD-10-CM | POA: Diagnosis not present

## 2014-07-17 DIAGNOSIS — S098XXA Other specified injuries of head, initial encounter: Secondary | ICD-10-CM | POA: Diagnosis not present

## 2014-07-17 DIAGNOSIS — Y92009 Unspecified place in unspecified non-institutional (private) residence as the place of occurrence of the external cause: Secondary | ICD-10-CM | POA: Diagnosis not present

## 2014-07-17 DIAGNOSIS — I10 Essential (primary) hypertension: Secondary | ICD-10-CM | POA: Diagnosis not present

## 2014-07-17 DIAGNOSIS — Y9389 Activity, other specified: Secondary | ICD-10-CM | POA: Insufficient documentation

## 2014-07-17 DIAGNOSIS — S0190XA Unspecified open wound of unspecified part of head, initial encounter: Secondary | ICD-10-CM | POA: Diagnosis not present

## 2014-07-17 DIAGNOSIS — S199XXA Unspecified injury of neck, initial encounter: Secondary | ICD-10-CM | POA: Diagnosis not present

## 2014-07-17 DIAGNOSIS — W010XXA Fall on same level from slipping, tripping and stumbling without subsequent striking against object, initial encounter: Secondary | ICD-10-CM | POA: Insufficient documentation

## 2014-07-17 DIAGNOSIS — F329 Major depressive disorder, single episode, unspecified: Secondary | ICD-10-CM | POA: Insufficient documentation

## 2014-07-17 DIAGNOSIS — Z791 Long term (current) use of non-steroidal anti-inflammatories (NSAID): Secondary | ICD-10-CM | POA: Diagnosis not present

## 2014-07-17 DIAGNOSIS — S12691A Other nondisplaced fracture of seventh cervical vertebra, initial encounter for closed fracture: Secondary | ICD-10-CM | POA: Insufficient documentation

## 2014-07-17 DIAGNOSIS — S0993XA Unspecified injury of face, initial encounter: Secondary | ICD-10-CM | POA: Diagnosis not present

## 2014-07-17 DIAGNOSIS — W19XXXA Unspecified fall, initial encounter: Secondary | ICD-10-CM

## 2014-07-17 DIAGNOSIS — Z79899 Other long term (current) drug therapy: Secondary | ICD-10-CM | POA: Insufficient documentation

## 2014-07-17 MED ORDER — TETANUS-DIPHTH-ACELL PERTUSSIS 5-2.5-18.5 LF-MCG/0.5 IM SUSP
0.5000 mL | Freq: Once | INTRAMUSCULAR | Status: AC
  Administered 2014-07-17: 0.5 mL via INTRAMUSCULAR
  Filled 2014-07-17: qty 0.5

## 2014-07-17 MED ORDER — LIDOCAINE HCL (PF) 1 % IJ SOLN
5.0000 mL | Freq: Once | INTRAMUSCULAR | Status: AC
  Administered 2014-07-17: 5 mL via INTRADERMAL
  Filled 2014-07-17: qty 5

## 2014-07-17 MED ORDER — HYDROCODONE-ACETAMINOPHEN 5-325 MG PO TABS: 1.0000 | ORAL_TABLET | Freq: Four times a day (QID) | ORAL | Status: DC | PRN

## 2014-07-17 NOTE — ED Notes (Signed)
NAD at this time. Pt is stable and going home with husband.

## 2014-07-17 NOTE — ED Provider Notes (Signed)
CSN: [id number]     Arrival date & time 07/17/14  1122 History   First MD Initiated Contact with Patient 07/17/14 1125     Chief Complaint  Patient presents with  . Fall  . Head Laceration     (Consider location/radiation/quality/duration/timing/severity/associated sxs/prior Treatment) Patient is a 79 y.o. female presenting with fall. The history is provided by the patient (the pt tripped and fell on her face).  Fall This is a new problem. The current episode started 1 to 2 hours ago. The problem occurs constantly. The problem has not changed since onset.Pertinent negatives include no chest pain, no abdominal pain and no headaches. Nothing aggravates the symptoms. Nothing relieves the symptoms.    Past Medical History  Diagnosis Date  . Depression   . Hypertension   . Hypercholesterolemia   . Cancer     [facility]-  MELANOMA REMOVED FROM BACK    Past Surgical History  Procedure Laterality Date  . Tonsillectomy and adenoidectomy    . Appendectomy    . Abdominal hysterectomy      VAGINAL HYSTERECTOMY WITH OVARIAN PRESERVATION   . Breast surgery  1985    BREAST REDUCTION   . Abdominoplasty  1998   Family History  Problem Relation Age of Onset  . Hypertension Mother   . Heart disease Father   . Diabetes Father   . Cancer Sister 43    OVARIAN   . Thyroid disease Sister   . Heart disease Brother   . Thyroid disease Daughter   . Diabetes Paternal Grandmother    History  Substance Use Topics  . Smoking status: Never Smoker   . Smokeless tobacco: Never Used  . Alcohol Use: Not on file   OB History    Gravida Para Term Preterm AB TAB SAB Ectopic Multiple Living   4 4        4      Review of Systems  Constitutional: Negative for appetite change and fatigue.  HENT: Negative for congestion, ear discharge and sinus pressure.        Facial pain  Eyes: Negative for discharge.  Respiratory: Negative for cough.   Cardiovascular: Negative for chest pain.   Gastrointestinal: Negative for abdominal pain and diarrhea.  Genitourinary: Negative for frequency and hematuria.  Musculoskeletal: Negative for back pain.  Skin: Negative for rash.  Neurological: Negative for seizures and headaches.  Psychiatric/Behavioral: Negative for hallucinations.      Allergies  Bee venom; Simvastatin; Codeine; Evista; and Percocet  Home Medications   Prior to Admission medications   Medication Sig Start Date End Date Taking? Authorizing Provider  acetaminophen (TYLENOL) 325 MG tablet Take 650 mg by mouth every 6 (six) hours as needed for pain.   Yes Historical Provider, MD  Calcium Carbonate-Vitamin D (CALCIUM + D PO) Take 1 tablet by mouth daily.   Yes Historical Provider, MD  cholecalciferol (VITAMIN D) 400 UNITS TABS tablet Take 5,000 Units by mouth.   Yes Historical Provider, MD  ezetimibe (ZETIA) 10 MG tablet Take 1 tablet (10 mg total) by mouth daily. 06/06/14  Yes Elayne Snare, MD  ibuprofen (ADVIL,MOTRIN) 200 MG tablet Take 200 mg by mouth every 6 (six) hours as needed for pain.   Yes Historical Provider, MD  meloxicam (MOBIC) 15 MG tablet Take 15 mg by mouth daily as needed for pain.  12/31/13  Yes Historical Provider, MD  rosuvastatin (CRESTOR) 40 MG tablet Take 1 tablet (40 mg total) by mouth daily. 06/06/14  Yes Ajay  Dwyane Dee, MD  sertraline (ZOLOFT) 25 MG tablet Take 1 tablet (25 mg total) by mouth daily. 01/21/14  Yes Elayne Snare, MD  traMADol (ULTRAM) 50 MG tablet Take 50 mg by mouth every 6 (six) hours as needed for moderate pain.   Yes Historical Provider, MD  vitamin C (ASCORBIC ACID) 500 MG tablet Take 500 mg by mouth daily.   Yes Historical Provider, MD  Efinaconazole 10 % SOLN Apply 1 drop topically daily. Patient not taking: Reported on 06/06/2014 10/19/13   Bronson Ing, DPM  Estradiol (VAGIFEM) 10 MCG TABS vaginal tablet Place 1 tablet (10 mcg total) vaginally 2 (two) times a week. Patient not taking: Reported on 07/17/2014 11/13/12   Terrance Mass, MD   BP 129/66 mmHg  Pulse 66  Temp(Src) 98.5 F (36.9 C) (Oral)  Resp 16  Ht 5\' 4"  (1.626 m)  Wt 155 lb (70.308 kg)  BMI 26.59 kg/m2  SpO2 98% Physical Exam  Constitutional: She is oriented to person, place, and time. She appears well-developed.  HENT:  Head: Normocephalic.  2 lacerations to face.  Both lacerations are about 1 cm  Eyes: Conjunctivae and EOM are normal. No scleral icterus.  Neck: Neck supple. No thyromegaly present.  Cardiovascular: Normal rate and regular rhythm.  Exam reveals no gallop and no friction rub.   No murmur heard. Pulmonary/Chest: No stridor. She has no wheezes. She has no rales. She exhibits no tenderness.  Abdominal: She exhibits no distension. There is no tenderness. There is no rebound.  Musculoskeletal: Normal range of motion. She exhibits no edema.  Lymphadenopathy:    She has no cervical adenopathy.  Neurological: She is oriented to person, place, and time. She exhibits normal muscle tone. Coordination normal.  Skin: No rash noted. No erythema.  Psychiatric: She has a normal mood and affect. Her behavior is normal.    ED Course  LACERATION REPAIR Date/Time: 07/17/2014 4:26 PM Performed by: Conni Slipper T Authorized by: Milton Ferguson Consent: Verbal consent obtained. Risks and benefits: risks, benefits and alternatives were discussed Consent given by: patient Patient understanding: patient states understanding of the procedure being performed Patient consent: the patient's understanding of the procedure matches consent given Procedure consent: procedure consent matches procedure scheduled Imaging studies: imaging studies available Patient identity confirmed: verbally with patient Time out: Immediately prior to procedure a "time out" was called to verify the correct patient, procedure, equipment, support staff and site/side marked as required. Body area: head/neck (Left temple) Wound length (cm): 1.5cm and  56mm. Contamination: The wound is contaminated. Foreign bodies: no foreign bodies Tendon involvement: none Nerve involvement: none Vascular damage: no Anesthesia: local infiltration Local anesthetic: lidocaine 1% without epinephrine Anesthetic total: 3 ml Patient sedated: no Preparation: Patient was prepped and draped in the usual sterile fashion. Irrigation solution: saline Irrigation method: syringe Amount of cleaning: standard Debridement: minimal Degree of undermining: none Skin closure: 6-0 Prolene Number of sutures: 3 Technique: simple Approximation: close Approximation difficulty: simple Dressing: 4x4 sterile gauze Patient tolerance: Patient tolerated the procedure well with no immediate complications   (including critical care time) Labs Review Labs Reviewed - No data to display  Imaging Review Ct Head Wo Contrast  07/17/2014   CLINICAL DATA:  Patient tripped and fell onto concrete. Reported loss of consciousness at time of fall  EXAM: CT HEAD WITHOUT CONTRAST  CT MAXILLOFACIAL WITHOUT CONTRAST  CT CERVICAL SPINE WITHOUT CONTRAST  TECHNIQUE: Multidetector CT imaging of the head, cervical spine, and maxillofacial structures were performed  using the standard protocol without intravenous contrast. Multiplanar CT image reconstructions of the cervical spine and maxillofacial structures were also generated.  COMPARISON:  Brain MRI November 28, 2006  FINDINGS: CT HEAD FINDINGS  The ventricles are normal in size and configuration. There is no intracranial mass, hemorrhage, extra-axial fluid collection, or midline shift. Gray-white compartments are normal. No demonstrable acute infarct. The bony calvarium appears intact. The mastoid air cells are clear.  CT MAXILLOFACIAL FINDINGS  There is soft tissue edema slightly superior and lateral to the left orbit and zygomatic arch. There is a small anterior, inferior left frontal scalp hematoma. A small amount of air is noted just superficial to  the anterior most aspect of the left zygomatic arch. There is no demonstrable fracture or dislocation. No intraorbital lesions are identified. The intraorbital contents are symmetric and normal bilaterally.  There is slight several ethmoid air cells bilaterally. Other paranasal sinuses are clear. There is no air-fluid level. No bony destruction expansion. Ostiomeatal complexes appear patent bilaterally. There is slight rightward deviation of the nasal septum.  Salivary glands appear normal.  No demonstrable adenopathy.  CT CERVICAL SPINE FINDINGS  There is a nondisplaced fracture extending through a portion of the posterior aspect of left C7 pars interarticularis. This finding is seen on axial slice 74 series 18 ; coronal slice 24 series 16; sagittal slice 40 series 17. No other fracture is apparent. There is no spondylolisthesis. Prevertebral soft tissues and predental space regions are normal. There is moderate disc space narrowing at C5-6. Other disc spaces appear intact. There is facet hypertrophy at multiple levels bilaterally. No disc extrusion or stenosis.  There is generalized enlargement of the thyroid, particularly involving the right lobe, with multiple mass lesions throughout the thyroid.  IMPRESSION: CT head: Study within normal limits. No intracranial mass, hemorrhage, or extra-axial fluid collection. Gray-white compartment regions appear normal.  CT maxillofacial: Evidence of soft tissue injury slightly lateral and superior to the left orbit with a small scalp hematoma slightly superior and lateral to the left orbit. No acute fracture or dislocation. No intraorbital lesions. Slight mucosal thickening in several ethmoid air cells. Paranasal sinuses otherwise clear. Mild rightward deviation of the nasal septum.  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 No other fracture. No spondylolisthesis. Moderate osteoarthritic change.  Enlarged thyroid  with multiple fairly large masses. Recommend further evaluation with thyroid ultrasound. If patient is clinically hyperthyroid, consider nuclear medicine thyroid uptake and scan.  Critical Value/emergent results were called by telephone at the time of interpretation on 07/17/2014 at 2:14 pm to Dr. Milton Ferguson , who verbally acknowledged these results.   Electronically Signed   By: Lowella Grip III M.D.   On: 07/17/2014 14:14   Ct Cervical Spine Wo Contrast  07/17/2014   CLINICAL DATA:  Patient tripped and fell onto concrete. Reported loss of consciousness at time of fall  EXAM: CT HEAD WITHOUT CONTRAST  CT MAXILLOFACIAL WITHOUT CONTRAST  CT CERVICAL SPINE WITHOUT CONTRAST  TECHNIQUE: Multidetector CT imaging of the head, cervical spine, and maxillofacial structures were performed using the standard protocol without intravenous contrast. Multiplanar CT image reconstructions of the cervical spine and maxillofacial structures were also generated.  COMPARISON:  Brain MRI November 28, 2006  FINDINGS: CT HEAD FINDINGS  The ventricles are normal in size and configuration. There is no intracranial mass, hemorrhage, extra-axial fluid collection, or midline shift. Gray-white compartments are normal. No demonstrable acute infarct. The bony calvarium appears  intact. The mastoid air cells are clear.  CT MAXILLOFACIAL FINDINGS  There is soft tissue edema slightly superior and lateral to the left orbit and zygomatic arch. There is a small anterior, inferior left frontal scalp hematoma. A small amount of air is noted just superficial to the anterior most aspect of the left zygomatic arch. There is no demonstrable fracture or dislocation. No intraorbital lesions are identified. The intraorbital contents are symmetric and normal bilaterally.  There is slight several ethmoid air cells bilaterally. Other paranasal sinuses are clear. There is no air-fluid level. No bony destruction expansion. Ostiomeatal complexes appear patent  bilaterally. There is slight rightward deviation of the nasal septum.  Salivary glands appear normal.  No demonstrable adenopathy.  CT CERVICAL SPINE FINDINGS  There is a nondisplaced fracture extending through a portion of the posterior aspect of left C7 pars interarticularis. This finding is seen on axial slice 74 series 18 ; coronal slice 24 series 16; sagittal slice 40 series 17. No other fracture is apparent. There is no spondylolisthesis. Prevertebral soft tissues and predental space regions are normal. There is moderate disc space narrowing at C5-6. Other disc spaces appear intact. There is facet hypertrophy at multiple levels bilaterally. No disc extrusion or stenosis.  There is generalized enlargement of the thyroid, particularly involving the right lobe, with multiple mass lesions throughout the thyroid.  IMPRESSION: CT head: Study within normal limits. No intracranial mass, hemorrhage, or extra-axial fluid collection. Gray-white compartment regions appear normal.  CT maxillofacial: Evidence of soft tissue injury slightly lateral and superior to the left orbit with a small scalp hematoma slightly superior and lateral to the left orbit. No acute fracture or dislocation. No intraorbital lesions. Slight mucosal thickening in several ethmoid air cells. Paranasal sinuses otherwise clear. Mild rightward deviation of the nasal septum.  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 No other fracture. No spondylolisthesis. Moderate osteoarthritic change.  Enlarged thyroid with multiple fairly large masses. Recommend further evaluation with thyroid ultrasound. If patient is clinically hyperthyroid, consider nuclear medicine thyroid uptake and scan.  Critical Value/emergent results were called by telephone at the time of interpretation on 07/17/2014 at 2:14 pm to Dr. Milton Ferguson , who verbally acknowledged these results.   Electronically Signed   By: Lowella Grip III M.D.   On: 07/17/2014 14:14   Ct Maxillofacial Wo Cm  07/17/2014   CLINICAL DATA:  Patient tripped and fell onto concrete. Reported loss of consciousness at time of fall  EXAM: CT HEAD WITHOUT CONTRAST  CT MAXILLOFACIAL WITHOUT CONTRAST  CT CERVICAL SPINE WITHOUT CONTRAST  TECHNIQUE: Multidetector CT imaging of the head, cervical spine, and maxillofacial structures were performed using the standard protocol without intravenous contrast. Multiplanar CT image reconstructions of the cervical spine and maxillofacial structures were also generated.  COMPARISON:  Brain MRI November 28, 2006  FINDINGS: CT HEAD FINDINGS  The ventricles are normal in size and configuration. There is no intracranial mass, hemorrhage, extra-axial fluid collection, or midline shift. Gray-white compartments are normal. No demonstrable acute infarct. The bony calvarium appears intact. The mastoid air cells are clear.  CT MAXILLOFACIAL FINDINGS  There is soft tissue edema slightly superior and lateral to the left orbit and zygomatic arch. There is a small anterior, inferior left frontal scalp hematoma. A small amount of air is noted just superficial to the anterior most aspect of the left zygomatic arch. There is no demonstrable fracture or dislocation. No intraorbital lesions are  identified. The intraorbital contents are symmetric and normal bilaterally.  There is slight several ethmoid air cells bilaterally. Other paranasal sinuses are clear. There is no air-fluid level. No bony destruction expansion. Ostiomeatal complexes appear patent bilaterally. There is slight rightward deviation of the nasal septum.  Salivary glands appear normal.  No demonstrable adenopathy.  CT CERVICAL SPINE FINDINGS  There is a nondisplaced fracture extending through a portion of the posterior aspect of left C7 pars interarticularis. This finding is seen on axial slice 74 series 18 ; coronal slice 24 series 16; sagittal slice 40 series 17. No other  fracture is apparent. There is no spondylolisthesis. Prevertebral soft tissues and predental space regions are normal. There is moderate disc space narrowing at C5-6. Other disc spaces appear intact. There is facet hypertrophy at multiple levels bilaterally. No disc extrusion or stenosis.  There is generalized enlargement of the thyroid, particularly involving the right lobe, with multiple mass lesions throughout the thyroid.  IMPRESSION: CT head: Study within normal limits. No intracranial mass, hemorrhage, or extra-axial fluid collection. Gray-white compartment regions appear normal.  CT maxillofacial: Evidence of soft tissue injury slightly lateral and superior to the left orbit with a small scalp hematoma slightly superior and lateral to the left orbit. No acute fracture or dislocation. No intraorbital lesions. Slight mucosal thickening in several ethmoid air cells. Paranasal sinuses otherwise clear. Mild rightward deviation of the nasal septum.  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 No other fracture. No spondylolisthesis. Moderate osteoarthritic change.  Enlarged thyroid with multiple fairly large masses. Recommend further evaluation with thyroid ultrasound. If patient is clinically hyperthyroid, consider nuclear medicine thyroid uptake and scan.  Critical Value/emergent results were called by telephone at the time of interpretation on 07/17/2014 at 2:14 pm to Dr. Milton Ferguson , who verbally acknowledged these results.   Electronically Signed   By: Lowella Grip III M.D.   On: 07/17/2014 14:14     EKG Interpretation None      MDM   Final diagnoses:  Fall    Facial lac,  c7 fx pt to get aspen collar and follow up with neurosurgery    Milton Ferguson, MD 07/18/14 [phone]

## 2014-07-17 NOTE — ED Notes (Signed)
Per [facility] pt was at home chasing some of her animals when she tripped and fell on concrete. She fell on her face and shattered her glasses. Family stated that she lost conciousness for about 2 mins. VS are as follows B/P: 130/77 Hr:84 CBG:111

## 2014-07-17 NOTE — Discharge Instructions (Signed)
Clean laceration twice a day with soap and water.   Sutures out in 5-7 days.  Follow up with Dr. Sherley Bounds in 2 weeks

## 2014-07-17 NOTE — ED Notes (Signed)
Placed aspen collar on pt and educated pt on use

## 2014-07-29 DIAGNOSIS — S12691A Other nondisplaced fracture of seventh cervical vertebra, initial encounter for closed fracture: Secondary | ICD-10-CM | POA: Diagnosis not present

## 2014-08-09 DIAGNOSIS — Z1231 Encounter for screening mammogram for malignant neoplasm of breast: Secondary | ICD-10-CM | POA: Diagnosis not present

## 2014-08-19 ENCOUNTER — Encounter: Payer: Self-pay | Admitting: Endocrinology

## 2014-08-19 DIAGNOSIS — S12691A Other nondisplaced fracture of seventh cervical vertebra, initial encounter for closed fracture: Secondary | ICD-10-CM | POA: Diagnosis not present

## 2014-08-19 DIAGNOSIS — Z6827 Body mass index (BMI) 27.0-27.9, adult: Secondary | ICD-10-CM | POA: Diagnosis not present

## 2014-09-16 DIAGNOSIS — S12691A Other nondisplaced fracture of seventh cervical vertebra, initial encounter for closed fracture: Secondary | ICD-10-CM | POA: Diagnosis not present

## 2014-10-10 ENCOUNTER — Other Ambulatory Visit (INDEPENDENT_AMBULATORY_CARE_PROVIDER_SITE_OTHER): Payer: Medicare Other

## 2014-10-10 ENCOUNTER — Ambulatory Visit: Payer: Medicare Other | Admitting: Endocrinology

## 2014-10-10 DIAGNOSIS — E782 Mixed hyperlipidemia: Secondary | ICD-10-CM

## 2014-10-10 LAB — BASIC METABOLIC PANEL
BUN: 13 mg/dL (ref 6–23)
CO2: 28 mEq/L (ref 19–32)
CREATININE: 0.86 mg/dL (ref 0.40–1.20)
Calcium: 9.5 mg/dL (ref 8.4–10.5)
Chloride: 107 mEq/L (ref 96–112)
GFR: 67.95 mL/min (ref >60.00)
Glucose, Bld: 102 mg/dL — ABNORMAL HIGH (ref 70–99)
Potassium: 3.9 mEq/L (ref 3.5–5.1)
Sodium: 142 mEq/L (ref 135–145)

## 2014-10-11 LAB — LIPOPROTEIN ANALYSIS BY NMR
HDL Particle Number: 37.8 umol/L (ref >=30.5)
LDL Particle Number: 1007 nmol/L — ABNORMAL HIGH (ref <1000)
LDL SIZE: 20.5 nm (ref >20.5)
LP-IR Score: 56 — ABNORMAL HIGH (ref <=45)
SMALL LDL PARTICLE NUMBER: 598 nmol/L — AB (ref <=527)

## 2014-10-14 ENCOUNTER — Ambulatory Visit (INDEPENDENT_AMBULATORY_CARE_PROVIDER_SITE_OTHER): Payer: Medicare Other | Admitting: Endocrinology

## 2014-10-14 ENCOUNTER — Encounter: Payer: Self-pay | Admitting: Endocrinology

## 2014-10-14 VITALS — BP 118/64 | HR 60 | Temp 98.1°F | Ht 64.0 in | Wt 151.4 lb

## 2014-10-14 DIAGNOSIS — R7301 Impaired fasting glucose: Secondary | ICD-10-CM

## 2014-10-14 DIAGNOSIS — E782 Mixed hyperlipidemia: Secondary | ICD-10-CM

## 2014-10-14 DIAGNOSIS — D509 Iron deficiency anemia, unspecified: Secondary | ICD-10-CM

## 2014-10-14 DIAGNOSIS — E049 Nontoxic goiter, unspecified: Secondary | ICD-10-CM

## 2014-10-14 NOTE — Patient Instructions (Signed)
Low fat low carb diet

## 2014-10-14 NOTE — Progress Notes (Deleted)
Pre visit review using our clinic review tool, if applicable. No additional management support is needed unless otherwise documented below in the visit note. 

## 2014-10-14 NOTE — Progress Notes (Signed)
Patient ID: Barbara Maxwell, female   DOB: 1935-11-17, 79 y.o.   MRN: [id number]    Chief complaint: Followup of various problems   History of Present Illness:   PROBLEM 1:  High blood pressure, previously treated since 1990 with various regimens. She was on 12.5 mg Toprol and 80 mg valsartan Since her blood pressure was low normal in 09/2013 her medications were stopped and she was told to follow her blood pressure at home.   Home 112/60s  She thinks her blood pressure has been normal and she has not had any palpitations either Blood pressure appears to be still well controlled  PROBLEM 2: History of mild impaired fasting glucose, glucose is 102 She thinks blood sugars are higher because she was recently at a wedding and eating poorly   Previously had consistently normal fasting in the last few checks and A1c was normal also  Lab Results  Component Value Date   HGBA1C 6.3 10/05/2013   HGBA1C 6.1 02/05/2013   Lab Results  Component Value Date   LDLCALC 66 06/05/2014   CREATININE 0.86 10/10/2014    PROBLEM 3:  High lipids since 1980s. Highest Direct LDL 209.  Initially given Lipitor in 2001. Was well controlled with Lipitor 20 mg and no side effects. Had myopathy with Zocor.  However LDL had increased to 154 and was changed to Crestor in 6/04.  This was increased to 40 mg because of LDL particle number of 2343.  Zetia added in 2/09 also and LDL particle number was 1289 in 12/13.   LDL particle number now is near 1000 but still has borderline LDL size  Triglycerides have been mildly high and HDL previously slightly low. Usually watching diet for saturated fats.  Lipids are as follows:   Lab Results  Component Value Date   CHOL 142 06/05/2014   HDL 40.70 06/05/2014   LDLCALC 66 06/05/2014   TRIG 178.0* 06/05/2014   CHOLHDL 3 06/05/2014         Medication List       This list is accurate as of: 10/14/14  9:08 PM.  Always use your most recent med list.                 acetaminophen 325 MG tablet  Commonly known as:  TYLENOL  Take 650 mg by mouth every 6 (six) hours as needed for pain.     CALCIUM + D PO  Take 1 tablet by mouth daily.     cholecalciferol 400 UNITS Tabs tablet  Commonly known as:  VITAMIN D  Take 5,000 Units by mouth.     Estradiol 10 MCG Tabs vaginal tablet  Commonly known as:  VAGIFEM  Place 1 tablet (10 mcg total) vaginally 2 (two) times a week.     ezetimibe 10 MG tablet  Commonly known as:  ZETIA  Take 1 tablet (10 mg total) by mouth daily.     ibuprofen 200 MG tablet  Commonly known as:  ADVIL,MOTRIN  Take 200 mg by mouth every 6 (six) hours as needed for pain.     meloxicam 15 MG tablet  Commonly known as:  MOBIC  Take 15 mg by mouth daily as needed for pain.     rosuvastatin 40 MG tablet  Commonly known as:  CRESTOR  Take 1 tablet (40 mg total) by mouth daily.     sertraline 25 MG tablet  Commonly known as:  ZOLOFT  Take 1 tablet (25 mg total)  by mouth daily.     traMADol 50 MG tablet  Commonly known as:  ULTRAM  Take 50 mg by mouth every 6 (six) hours as needed for moderate pain.     vitamin C 500 MG tablet  Commonly known as:  ASCORBIC ACID  Take 500 mg by mouth daily.        Allergies:  Allergies  Allergen Reactions  . Bee Venom Anaphylaxis  . Simvastatin     Myopathy  . Codeine Nausea Only  . Evista [Raloxifene]     Hot flashes  . Percocet [Oxycodone-Acetaminophen] Nausea And Vomiting    Nausea and vomiting    Past Medical History  Diagnosis Date  . Depression   . Hypertension   . Hypercholesterolemia   . Cancer     SMALL PLACE-  MELANOMA REMOVED FROM BACK    Episode of severe anxiety/amnesia 10/08.  Past Surgical History  Procedure Laterality Date  . Tonsillectomy and adenoidectomy    . Appendectomy    . Abdominal hysterectomy      VAGINAL HYSTERECTOMY WITH OVARIAN PRESERVATION   . Breast surgery  1985    BREAST REDUCTION   . Abdominoplasty  1998    Family  History  Problem Relation Age of Onset  . Hypertension Mother   . Heart disease Father   . Diabetes Father   . Cancer Sister 68    OVARIAN   . Thyroid disease Sister   . Heart disease Brother   . Thyroid disease Daughter   . Diabetes Paternal Grandmother      Social History:  reports that she has never smoked. She has never used smokeless tobacco. Her alcohol and drug histories are not on file.  Review of Systems -    History of anemia.  Hb 11.1 in 1/14 associated with iron deficiency. She has been taking iron supplements. Hemoccult negative in 4/16  Lab Results  Component Value Date   WBC 6.7 02/04/2014   HGB 13.6 02/04/2014   HCT 41.3 02/04/2014   MCV 86.8 02/04/2014   PLT 270.0 02/04/2014     She had problems with osteoarthritis of her knees and takes Mobic prn  She had a fall and had a fracture of C7 vertebra, treated by neurosurgeon and is now completely healed  She has recently lost weight from stress in various intercurrent problems.  Wt Readings from Last 3 Encounters:  10/14/14 151 lb 6 oz (68.663 kg)  07/17/14 155 lb (70.308 kg)  06/06/14 157 lb 8 oz (71.442 kg)   Lab Results  Component Value Date   HGBA1C 6.3 10/05/2013      Vitamin D deficiency: The baseline level was 20. The last level was normal checked by gynecologist in 11/15 She has been taking 2000 units daily    EXAM:  BP 118/64 mmHg  Pulse 60  Temp(Src) 98.1 F (36.7 C) (Oral)  Ht 5\' 4"  (1.626 m)  Wt 151 lb 6 oz (68.663 kg)  BMI 25.97 kg/m2  SpO2 96%   Exam not indicated  Assessment/Plan:   Prevnar  1. Blood pressure is still normal without any medications She will continue to monitor periodically at home  2.  HYPERLIPIDEMIA: Has been controlled with combination medications, LDL particle number near and ideal range currently  3.  Impaired fasting glucose; highest 108 previously and now 102 likely to be from poor diet recently Advised her to be consistent with diet and  will check her fasting glucose and A1c on the next visit  Also advised her to restart water aerobics  4.  GOITER: Her last TSH was  normal and periodically check this.  Clinically she looks euthyroid  Vitamin D deficiency: Adequately replaced.  She has minimal osteopenia and gynecologist is going to check her bone density      Barbara Maxwell 10/14/2014, 9:08 PM

## 2014-11-18 DIAGNOSIS — Z85820 Personal history of malignant melanoma of skin: Secondary | ICD-10-CM | POA: Diagnosis not present

## 2014-11-18 DIAGNOSIS — D2239 Melanocytic nevi of other parts of face: Secondary | ICD-10-CM | POA: Diagnosis not present

## 2014-11-18 DIAGNOSIS — L814 Other melanin hyperpigmentation: Secondary | ICD-10-CM | POA: Diagnosis not present

## 2014-11-18 DIAGNOSIS — D2262 Melanocytic nevi of left upper limb, including shoulder: Secondary | ICD-10-CM | POA: Diagnosis not present

## 2014-11-18 DIAGNOSIS — L821 Other seborrheic keratosis: Secondary | ICD-10-CM | POA: Diagnosis not present

## 2014-11-18 DIAGNOSIS — D225 Melanocytic nevi of trunk: Secondary | ICD-10-CM | POA: Diagnosis not present

## 2014-11-18 DIAGNOSIS — D2261 Melanocytic nevi of right upper limb, including shoulder: Secondary | ICD-10-CM | POA: Diagnosis not present

## 2014-11-18 DIAGNOSIS — D1801 Hemangioma of skin and subcutaneous tissue: Secondary | ICD-10-CM | POA: Diagnosis not present

## 2014-12-23 ENCOUNTER — Ambulatory Visit (INDEPENDENT_AMBULATORY_CARE_PROVIDER_SITE_OTHER): Payer: Medicare Other

## 2014-12-23 DIAGNOSIS — Z23 Encounter for immunization: Secondary | ICD-10-CM

## 2015-01-21 ENCOUNTER — Encounter: Payer: Self-pay | Admitting: Gynecology

## 2015-01-21 ENCOUNTER — Ambulatory Visit (INDEPENDENT_AMBULATORY_CARE_PROVIDER_SITE_OTHER): Payer: Medicare Other | Admitting: Gynecology

## 2015-01-21 VITALS — BP 122/72 | Ht 62.75 in | Wt 153.0 lb

## 2015-01-21 DIAGNOSIS — R351 Nocturia: Secondary | ICD-10-CM

## 2015-01-21 DIAGNOSIS — Z01419 Encounter for gynecological examination (general) (routine) without abnormal findings: Secondary | ICD-10-CM | POA: Diagnosis not present

## 2015-01-21 DIAGNOSIS — M8580 Other specified disorders of bone density and structure, unspecified site: Secondary | ICD-10-CM | POA: Diagnosis not present

## 2015-01-21 DIAGNOSIS — N816 Rectocele: Secondary | ICD-10-CM | POA: Diagnosis not present

## 2015-01-21 DIAGNOSIS — Z8041 Family history of malignant neoplasm of ovary: Secondary | ICD-10-CM | POA: Diagnosis not present

## 2015-01-21 DIAGNOSIS — N952 Postmenopausal atrophic vaginitis: Secondary | ICD-10-CM | POA: Diagnosis not present

## 2015-01-21 DIAGNOSIS — N3000 Acute cystitis without hematuria: Secondary | ICD-10-CM

## 2015-01-21 DIAGNOSIS — Z8639 Personal history of other endocrine, nutritional and metabolic disease: Secondary | ICD-10-CM | POA: Diagnosis not present

## 2015-01-21 DIAGNOSIS — M859 Disorder of bone density and structure, unspecified: Secondary | ICD-10-CM | POA: Diagnosis not present

## 2015-01-21 LAB — URINALYSIS W MICROSCOPIC + REFLEX CULTURE
Bilirubin Urine: NEGATIVE
Glucose, UA: NEGATIVE
Hgb urine dipstick: NEGATIVE
NITRITE: NEGATIVE
PH: 5.5 (ref 5.0–8.0)
Protein, ur: NEGATIVE
SPECIFIC GRAVITY, URINE: 1.025 (ref 1.001–1.035)
Yeast: NONE SEEN [HPF]

## 2015-01-21 MED ORDER — NITROFURANTOIN MONOHYD MACRO 100 MG PO CAPS: 100.0000 mg | ORAL_CAPSULE | Freq: Two times a day (BID) | ORAL | Status: DC

## 2015-01-21 MED ORDER — ESTRADIOL 10 MCG VA TABS: 10.0000 ug | ORAL_TABLET | VAGINAL | Status: DC

## 2015-01-21 NOTE — Progress Notes (Signed)
Barbara Maxwell [birth year] [id number]   History:    79 y.o.  for annual gyn exam who was complaining of the past several days of nocturia and some mild back discomfort. She denied any dysuria, fever, chills, nausea or vomiting. Patient with past history of transvaginal hysterectomy secondary to fibroid uterus and menorrhagia.Patient's sister was diagnosed with ovarian cancer at the age of 5 and for this reason last year we did an ultrasound and a CA-125 for annual screening. Her ultrasound in 2015 CA 125 demonstrated the following:  Ultrasound 2015 right ovarian echo-free thinwall avascular cyst 10 x 9 x 7 mm decreased in size. No fluid in the cul-de-sac. CA 125 7  Her colonoscopy was normal in 2006 done by Dr. Earlean Shawl. Patient with no previous history of abnormal Pap smears. Mammogram May of this year was normal. Patient's last bone density study was in 2014 and the lowest T score was at the left femoral neck with a value -1.1 her Frax analysis indicated she had a 6.7% risk of hip fracture. She is currently only taking calcium and vitamin D. She has had history vitamin D deficiency in the past.  Patient with history of melanoma in her back in 2001 had been followed by dermatologist Dr. Otho Bellows and a wide excision done by Dr. Craig Staggers. Patient also has had history of breast reduction as well as abdominoplasty several years ago. Patient has had the shinglkes and Tdap vaccine are up-to-date. Patient is on Vagifem 10 g tablet twice a week for vaginal atrophy and has done well.  Dr. Dwyane Dee is her PCP and has been doing her blood work  Past medical history,surgical history, family history and social history were all reviewed and documented in the EPIC chart.  Gynecologic History No LMP recorded. Patient is postmenopausal. Contraception: post menopausal status Last Pap: 2011. Results were: normal Last mammogram: 2016. Results were: normal  Obstetric History OB History  Gravida Para Term Preterm  AB SAB TAB Ectopic Multiple Living  4 4        4     # Outcome Date GA Lbr Len/2nd Weight Sex Delivery Anes PTL Lv  4 Para           3 Para           2 Para           1 Para                ROS: A ROS was performed and pertinent positives and negatives are included in the history.  GENERAL: No fevers or chills. HEENT: No change in vision, no earache, sore throat or sinus congestion. NECK: No pain or stiffness. CARDIOVASCULAR: No chest pain or pressure. No palpitations. PULMONARY: No shortness of breath, cough or wheeze. GASTROINTESTINAL: No abdominal pain, nausea, vomiting or diarrhea, melena or bright red blood per rectum. GENITOURINARY: No urinary frequency, urgency, hesitancy or dysuria. MUSCULOSKELETAL: No joint or muscle pain, no back pain, no recent trauma. DERMATOLOGIC: No rash, no itching, no lesions. ENDOCRINE: No polyuria, polydipsia, no heat or cold intolerance. No recent change in weight. HEMATOLOGICAL: No anemia or easy bruising or bleeding. NEUROLOGIC: No headache, seizures, numbness, tingling or weakness. PSYCHIATRIC: No depression, no loss of interest in normal activity or change in sleep pattern.     Exam: chaperone present  BP 122/72 mmHg  Ht 5' 2.75" (1.594 m)  Wt 153 lb (69.4 kg)  BMI 27.31 kg/m2  Body mass index is 27.31 kg/(m^2).  General appearance :  Well developed well nourished female. No acute distress HEENT: Eyes: no retinal hemorrhage or exudates,  Neck supple, trachea midline, no carotid bruits, no thyroidmegaly Lungs: Clear to auscultation, no rhonchi or wheezes, or rib retractions  Heart: Regular rate and rhythm, no murmurs or gallops Breast:Examined in sitting and supine position were symmetrical in appearance, no palpable masses or tenderness,  no skin retraction, no nipple inversion, no nipple discharge, no skin discoloration, no axillary or supraclavicular lymphadenopathy Abdomen: no palpable masses or tenderness, no rebound or guarding Extremities: no  edema or skin discoloration or tenderness  Pelvic:  Bartholin, Urethra, Skene Glands: Within normal limits             Vagina: No gross lesions or discharge, atrophic changes  Cervix: Absent  Uterus  absent  Adnexa  Without masses or tenderness  Anus and perineum  normal   Rectovaginal  normal sphincter tone without palpated masses or tenderness             Hemoccult PCP provides     Assessment/Plan:  79 y.o. female for annual exam will return back next month for annual ultrasound screening as a result of her sister with ovarian cancer at the age of 73. Patient will stop by the lab today to check a CA 125. She will schedule her bone density study for January 2017. We discussed importance of calcium vitamin D and regular exercise for osteoporosis prevention. We'll also check her vitamin D level due to the fact that she's had history vitamin D deficiency in the past. Her PCP has been doing her blood work. For her urinary tract infection she will be prescribed Macrobid one by mouth twice a day for 7 days. Prescription refill for Vagifem 10 g to apply intravaginally twice a week for vaginal atrophy was provided.   Uvaldo Rising H MD, 5:28 PM 01/21/2015   Who for the past several weeks is been complaining nocturia and frequency but no dysuria

## 2015-01-21 NOTE — Patient Instructions (Signed)
Nitrofurantoin tablets or capsules What is this medicine? NITROFURANTOIN (nye troe fyoor AN toyn) is an antibiotic. It is used to treat urinary tract infections. This medicine may be used for other purposes; ask your health care provider or pharmacist if you have questions. What should I tell my health care provider before I take this medicine? They need to know if you have any of these conditions: -anemia -diabetes -glucose-6-phosphate dehydrogenase deficiency -kidney disease -liver disease -lung disease -other chronic illness -an unusual or allergic reaction to nitrofurantoin, other antibiotics, other medicines, foods, dyes or preservatives -pregnant or trying to get pregnant -breast-feeding How should I use this medicine? Take this medicine by mouth with a glass of water. Follow the directions on the prescription label. Take this medicine with food or milk. Take your doses at regular intervals. Do not take your medicine more often than directed. Do not stop taking except on your doctor's advice. Talk to your pediatrician regarding the use of this medicine in children. While this drug may be prescribed for selected conditions, precautions do apply. Overdosage: If you think you have taken too much of this medicine contact a poison control center or emergency room at once. NOTE: This medicine is only for you. Do not share this medicine with others. What if I miss a dose? If you miss a dose, take it as soon as you can. If it is almost time for your next dose, take only that dose. Do not take double or extra doses. What may interact with this medicine? -antacids containing magnesium trisilicate -probenecid -quinolone antibiotics like ciprofloxacin, lomefloxacin, norfloxacin and ofloxacin -sulfinpyrazone This list may not describe all possible interactions. Give your health care provider a list of all the medicines, herbs, non-prescription drugs, or dietary supplements you use. Also tell  them if you smoke, drink alcohol, or use illegal drugs. Some items may interact with your medicine. What should I watch for while using this medicine? Tell your doctor or health care professional if your symptoms do not improve or if you get new symptoms. Drink several glasses of water a day. If you are taking this medicine for a long time, visit your doctor for regular checks on your progress. If you are diabetic, you may get a false positive result for sugar in your urine with certain brands of urine tests. Check with your doctor. What side effects may I notice from receiving this medicine? Side effects that you should report to your doctor or health care professional as soon as possible: -allergic reactions like skin rash or hives, swelling of the face, lips, or tongue -chest pain -cough -difficulty breathing -dizziness, drowsiness -fever or infection -joint aches or pains -pale or blue-tinted skin -redness, blistering, peeling or loosening of the skin, including inside the mouth -tingling, burning, pain, or numbness in hands or feet -unusual bleeding or bruising -unusually weak or tired -yellowing of eyes or skin Side effects that usually do not require medical attention (report to your doctor or health care professional if they continue or are bothersome): -dark urine -diarrhea -headache -loss of appetite -nausea or vomiting -temporary hair loss This list may not describe all possible side effects. Call your doctor for medical advice about side effects. You may report side effects to FDA at 1-800-FDA-1088. Where should I keep my medicine? Keep out of the reach of children. Store at room temperature between 15 and 30 degrees C (59 and 86 degrees F). Protect from light. Throw away any unused medicine after the expiration date. NOTE:   This sheet is a summary. It may not cover all possible information. If you have questions about this medicine, talk to your doctor, pharmacist, or health  care provider.    2016, Elsevier/Gold Standard. (2007-08-30 15:56:47) Urinary Tract Infection Urinary tract infections (UTIs) can develop anywhere along your urinary tract. Your urinary tract is your body's drainage system for removing wastes and extra water. Your urinary tract includes two kidneys, two ureters, a bladder, and a urethra. Your kidneys are a pair of bean-shaped organs. Each kidney is about the size of your fist. They are located below your ribs, one on each side of your spine. CAUSES Infections are caused by microbes, which are microscopic organisms, including fungi, viruses, and bacteria. These organisms are so small that they can only be seen through a microscope. Bacteria are the microbes that most commonly cause UTIs. SYMPTOMS  Symptoms of UTIs may vary by age and gender of the patient and by the location of the infection. Symptoms in young women typically include a frequent and intense urge to urinate and a painful, burning feeling in the bladder or urethra during urination. Older women and men are more likely to be tired, shaky, and weak and have muscle aches and abdominal pain. A fever may mean the infection is in your kidneys. Other symptoms of a kidney infection include pain in your back or sides below the ribs, nausea, and vomiting. DIAGNOSIS To diagnose a UTI, your caregiver will ask you about your symptoms. Your caregiver will also ask you to provide a urine sample. The urine sample will be tested for bacteria and white blood cells. White blood cells are made by your body to help fight infection. TREATMENT  Typically, UTIs can be treated with medication. Because most UTIs are caused by a bacterial infection, they usually can be treated with the use of antibiotics. The choice of antibiotic and length of treatment depend on your symptoms and the type of bacteria causing your infection. HOME CARE INSTRUCTIONS  If you were prescribed antibiotics, take them exactly as your  caregiver instructs you. Finish the medication even if you feel better after you have only taken some of the medication.  Drink enough water and fluids to keep your urine clear or pale yellow.  Avoid caffeine, tea, and carbonated beverages. They tend to irritate your bladder.  Empty your bladder often. Avoid holding urine for long periods of time.  Empty your bladder before and after sexual intercourse.  After a bowel movement, women should cleanse from front to back. Use each tissue only once. SEEK MEDICAL CARE IF:   You have back pain.  You develop a fever.  Your symptoms do not begin to resolve within 3 days. SEEK IMMEDIATE MEDICAL CARE IF:   You have severe back pain or lower abdominal pain.  You develop chills.  You have nausea or vomiting.  You have continued burning or discomfort with urination. MAKE SURE YOU:   Understand these instructions.  Will watch your condition.  Will get help right away if you are not doing well or get worse.   This information is not intended to replace advice given to you by your health care provider. Make sure you discuss any questions you have with your health care provider.   Document Released: 11/18/2004 Document Revised: 10/30/2014 Document Reviewed: 03/19/2011 Elsevier Interactive Patient Education 2016 Elsevier Inc.  

## 2015-01-22 ENCOUNTER — Telehealth: Payer: Self-pay

## 2015-01-22 ENCOUNTER — Other Ambulatory Visit: Payer: Self-pay | Admitting: Gynecology

## 2015-01-22 LAB — CA 125: CA 125: 6 U/mL (ref <35)

## 2015-01-22 LAB — VITAMIN D 25 HYDROXY (VIT D DEFICIENCY, FRACTURES): Vit D, 25-Hydroxy: 39 ng/mL (ref 30–100)

## 2015-01-22 MED ORDER — NITROFURANTOIN MONOHYD MACRO 100 MG PO CAPS: 100.0000 mg | ORAL_CAPSULE | Freq: Two times a day (BID) | ORAL | Status: DC

## 2015-01-22 NOTE — Telephone Encounter (Signed)
Patient stopped in over lunch and spoke with Chauncey Reading. Patient said Macrobid was to be sent to her local pharmacy yesterday and she believes it was sent to [facility] with her Vagifem.  I reordered the Macrobid to her local pharmacy. I called [facility] and spoke with Jenny Reichmann in the [employer] division.  She did not see either of these Rxs in their system but said it can take 24-48 hours before they get them.  She could not cancel Macrobid because she could not see it. I asked her about putting a note in her system to cancel it/not fill it if it does show up and she said that would not work either. She recommended I call back [phone] tomorrow to cancel.  Patient was advised of all this.

## 2015-01-23 ENCOUNTER — Other Ambulatory Visit: Payer: Self-pay | Admitting: Gynecology

## 2015-01-23 DIAGNOSIS — N3001 Acute cystitis with hematuria: Secondary | ICD-10-CM

## 2015-01-23 DIAGNOSIS — R3129 Other microscopic hematuria: Secondary | ICD-10-CM

## 2015-01-23 LAB — URINE CULTURE
Colony Count: NO GROWTH
Organism ID, Bacteria: NO GROWTH

## 2015-01-23 NOTE — Telephone Encounter (Signed)
I called and spoke with Pam at [facility] to cancel the [facility]. She said even though they could not cancel yesterday it processed overnight and has been filled. She will still try to cancel it as it has not yet been shipped.  She said it should be able to be caught and not sent out according to what she is reading on screen.

## 2015-01-29 ENCOUNTER — Other Ambulatory Visit: Payer: Medicare Other

## 2015-01-29 DIAGNOSIS — N3000 Acute cystitis without hematuria: Secondary | ICD-10-CM | POA: Diagnosis not present

## 2015-01-29 DIAGNOSIS — N3001 Acute cystitis with hematuria: Secondary | ICD-10-CM

## 2015-01-30 LAB — URINALYSIS W MICROSCOPIC + REFLEX CULTURE
Bacteria, UA: NONE SEEN [HPF]
Bilirubin Urine: NEGATIVE
CASTS: NONE SEEN [LPF]
GLUCOSE, UA: NEGATIVE
HGB URINE DIPSTICK: NEGATIVE
KETONES UR: NEGATIVE
Leukocytes, UA: NEGATIVE
NITRITE: NEGATIVE
Protein, ur: NEGATIVE
RBC / HPF: NONE SEEN RBC/HPF (ref <=2)
Specific Gravity, Urine: 1.028 (ref 1.001–1.035)
Yeast: NONE SEEN [HPF]
pH: 5.5 (ref 5.0–8.0)

## 2015-01-31 LAB — URINE CULTURE
Colony Count: NO GROWTH
Organism ID, Bacteria: NO GROWTH

## 2015-02-06 ENCOUNTER — Ambulatory Visit (INDEPENDENT_AMBULATORY_CARE_PROVIDER_SITE_OTHER): Payer: Medicare Other

## 2015-02-06 ENCOUNTER — Telehealth: Payer: Self-pay | Admitting: *Deleted

## 2015-02-06 ENCOUNTER — Other Ambulatory Visit: Payer: Self-pay | Admitting: Gynecology

## 2015-02-06 DIAGNOSIS — M8589 Other specified disorders of bone density and structure, multiple sites: Secondary | ICD-10-CM

## 2015-02-06 DIAGNOSIS — Z8639 Personal history of other endocrine, nutritional and metabolic disease: Secondary | ICD-10-CM

## 2015-02-06 DIAGNOSIS — M8580 Other specified disorders of bone density and structure, unspecified site: Secondary | ICD-10-CM

## 2015-02-06 DIAGNOSIS — M899 Disorder of bone, unspecified: Secondary | ICD-10-CM

## 2015-02-06 MED ORDER — ESTRADIOL 10 MCG VA TABS: 10.0000 ug | ORAL_TABLET | VAGINAL | Status: DC

## 2015-02-06 NOTE — Telephone Encounter (Signed)
Pt states Rx for vagifem 10 mcg is not at the pharmacy, Rx will be re-sent, pt aware recent urine culture on 01/29/15

## 2015-02-10 ENCOUNTER — Other Ambulatory Visit (INDEPENDENT_AMBULATORY_CARE_PROVIDER_SITE_OTHER): Payer: Medicare Other

## 2015-02-10 DIAGNOSIS — D509 Iron deficiency anemia, unspecified: Secondary | ICD-10-CM

## 2015-02-10 DIAGNOSIS — R7301 Impaired fasting glucose: Secondary | ICD-10-CM

## 2015-02-10 DIAGNOSIS — E049 Nontoxic goiter, unspecified: Secondary | ICD-10-CM | POA: Diagnosis not present

## 2015-02-10 DIAGNOSIS — E782 Mixed hyperlipidemia: Secondary | ICD-10-CM

## 2015-02-10 LAB — LIPID PANEL
Cholesterol: 143 mg/dL (ref 0–200)
HDL: 46.6 mg/dL (ref >39.00)
LDL Cholesterol: 74 mg/dL (ref 0–99)
NonHDL: 96.42
TRIGLYCERIDES: 111 mg/dL (ref 0.0–149.0)
Total CHOL/HDL Ratio: 3
VLDL: 22.2 mg/dL (ref 0.0–40.0)

## 2015-02-10 LAB — CBC
HEMATOCRIT: 40.9 % (ref 36.0–46.0)
Hemoglobin: 13.6 g/dL (ref 12.0–15.0)
MCHC: 33.2 g/dL (ref 30.0–36.0)
MCV: 86.3 fl (ref 78.0–100.0)
PLATELETS: 280 10*3/uL (ref 150.0–400.0)
RBC: 4.74 Mil/uL (ref 3.87–5.11)
RDW: 13.5 % (ref 11.5–15.5)
WBC: 6 10*3/uL (ref 4.0–10.5)

## 2015-02-10 LAB — COMPREHENSIVE METABOLIC PANEL
ALBUMIN: 4.2 g/dL (ref 3.5–5.2)
ALK PHOS: 46 U/L (ref 39–117)
ALT: 15 U/L (ref 0–35)
AST: 22 U/L (ref 0–37)
BILIRUBIN TOTAL: 1.1 mg/dL (ref 0.2–1.2)
BUN: 19 mg/dL (ref 6–23)
CO2: 27 mEq/L (ref 19–32)
Calcium: 9.7 mg/dL (ref 8.4–10.5)
Chloride: 107 mEq/L (ref 96–112)
Creatinine, Ser: 0.8 mg/dL (ref 0.40–1.20)
GFR: 73.8 mL/min (ref >60.00)
Glucose, Bld: 99 mg/dL (ref 70–99)
Potassium: 4.6 mEq/L (ref 3.5–5.1)
Sodium: 143 mEq/L (ref 135–145)
TOTAL PROTEIN: 6.8 g/dL (ref 6.0–8.3)

## 2015-02-10 LAB — TSH: TSH: 0.53 u[IU]/mL (ref 0.35–4.50)

## 2015-02-10 LAB — T4, FREE: Free T4: 0.74 ng/dL (ref 0.60–1.60)

## 2015-02-10 LAB — HEMOGLOBIN A1C: HEMOGLOBIN A1C: 6.1 % (ref 4.6–6.5)

## 2015-02-13 ENCOUNTER — Ambulatory Visit (INDEPENDENT_AMBULATORY_CARE_PROVIDER_SITE_OTHER): Payer: Medicare Other | Admitting: Endocrinology

## 2015-02-13 ENCOUNTER — Encounter: Payer: Self-pay | Admitting: Endocrinology

## 2015-02-13 VITALS — BP 118/76 | HR 71 | Temp 97.9°F | Resp 14 | Ht 62.75 in | Wt 151.8 lb

## 2015-02-13 DIAGNOSIS — E782 Mixed hyperlipidemia: Secondary | ICD-10-CM | POA: Diagnosis not present

## 2015-02-13 DIAGNOSIS — R7301 Impaired fasting glucose: Secondary | ICD-10-CM

## 2015-02-13 DIAGNOSIS — E049 Nontoxic goiter, unspecified: Secondary | ICD-10-CM

## 2015-02-13 DIAGNOSIS — Z862 Personal history of diseases of the blood and blood-forming organs and certain disorders involving the immune mechanism: Secondary | ICD-10-CM

## 2015-02-13 NOTE — Progress Notes (Signed)
Patient ID: Barbara Maxwell, female   DOB: 09/29/1935, 79 y.o.   MRN: [id number]    Chief complaint: Followup of various problems   History of Present Illness:   PROBLEM 1:   History of mild impaired fasting glucose, glucose is now 99, previously 102  A1c was  on her lab work again normalalthough still on the high end She has not lost much weight but she is trying to get back into exercise with water aerobics   Lab Results  Component Value Date   HGBA1C 6.1 02/10/2015   HGBA1C 6.3 10/05/2013   HGBA1C 6.1 02/05/2013   Lab Results  Component Value Date   LDLCALC 74 02/10/2015   CREATININE 0.80 02/10/2015    PROBLEM 2:  History of high blood pressure, previously treated since 1990 with various regimens. She was on 12.5 mg Toprol and 80 mg valsartan Since her blood pressure was low normal in 09/2013 her medications were stopped Blood pressure has been consistently normal since then Home readings are also fairly normal length similar to the office reading   PROBLEM 3:  High lipids since 1980s. Highest Direct LDL 209.  Initially given Lipitor in 2001. Was well controlled with Lipitor 20 mg and no side effects. Had myopathy with Zocor.  However LDL had increased to 154 and was changed to Crestor in 6/04.  This was increased to 40 mg because of LDL particle number of 2343.  Zetia added in 2/09 also and LDL particle number was 1289 in 12/13.   LDL particle number was last  near 1000 but still has borderline LDL size  Triglycerides have been mildly high and HDL previously slightly low,  Now improved. Usually watching diet for saturated fats. LDL is consistently below 100  Lipids are as follows:   Lab Results  Component Value Date   CHOL 143 02/10/2015   HDL 46.60 02/10/2015   LDLCALC 74 02/10/2015   TRIG 111.0 02/10/2015   CHOLHDL 3 02/10/2015         Medication List       This list is accurate as of: 02/13/15  1:58 PM.  Always use your most recent med  list.               acetaminophen 325 MG tablet  Commonly known as:  TYLENOL  Take 650 mg by mouth every 6 (six) hours as needed for pain.     CALCIUM + D PO  Take 1 tablet by mouth daily.     cholecalciferol 400 UNITS Tabs tablet  Commonly known as:  VITAMIN D  Take 5,000 Units by mouth.     Estradiol 10 MCG Tabs vaginal tablet  Commonly known as:  VAGIFEM  Place 1 tablet (10 mcg total) vaginally 2 (two) times a week.     ezetimibe 10 MG tablet  Commonly known as:  ZETIA  Take 1 tablet (10 mg total) by mouth daily.     ibuprofen 200 MG tablet  Commonly known as:  ADVIL,MOTRIN  Take 200 mg by mouth every 6 (six) hours as needed for pain.     meloxicam 15 MG tablet  Commonly known as:  MOBIC  Take 15 mg by mouth daily as needed for pain.     nitrofurantoin (macrocrystal-monohydrate) 100 MG capsule  Commonly known as:  MACROBID  Take 1 capsule (100 mg total) by mouth 2 (two) times daily.     rosuvastatin 40 MG tablet  Commonly known as:  CRESTOR  Take 1 tablet (40 mg total) by mouth daily.     sertraline 25 MG tablet  Commonly known as:  ZOLOFT  Take 1 tablet (25 mg total) by mouth daily.     vitamin C 500 MG tablet  Commonly known as:  ASCORBIC ACID  Take 500 mg by mouth daily.        Allergies:  Allergies  Allergen Reactions  . Bee Venom Anaphylaxis  . Simvastatin     Myopathy  . Codeine Nausea Only  . Evista [Raloxifene]     Hot flashes  . Percocet [Oxycodone-Acetaminophen] Nausea And Vomiting    Nausea and vomiting    Past Medical History  Diagnosis Date  . Depression   . Hypertension   . Hypercholesterolemia   . Cancer [facility])     SMALL PLACE-  MELANOMA REMOVED FROM BACK    Episode of severe anxiety/amnesia 10/08.  Past Surgical History  Procedure Laterality Date  . Tonsillectomy and adenoidectomy    . Appendectomy    . Abdominal hysterectomy      VAGINAL HYSTERECTOMY WITH OVARIAN PRESERVATION   . Breast surgery  1985    BREAST  REDUCTION   . Abdominoplasty  1998    Family History  Problem Relation Age of Onset  . Hypertension Mother   . Heart disease Father   . Diabetes Father   . Cancer Sister 23    OVARIAN   . Thyroid disease Sister   . Heart disease Brother   . Thyroid disease Daughter   . Diabetes Paternal Grandmother      Social History:  reports that she has never smoked. She has never used smokeless tobacco. She reports that she does not drink alcohol. Her drug history is not on file.  Review of Systems -   HISTORY of osteopenia: Recent bone density from gynecologist is normal  History of anemia.  Hb 11.1 in 1/14 associated with iron deficiency.  Anemia has been resolved now Hemoccult negative in 4/16  Lab Results  Component Value Date   WBC 6.0 02/10/2015   HGB 13.6 02/10/2015   HCT 40.9 02/10/2015   MCV 86.3 02/10/2015   PLT 280.0 02/10/2015    She had problems with osteoarthritis of her knees which is better but she  takes Mobic prn Commonly for back pain      Vitamin D deficiency: The baseline level was 20. The last level was normal checked by gynecologist in 11/16 She has been taking 2000 units daily  History of goiter: She continues to be euthyroid  Lab Results  Component Value Date   TSH 0.53 02/10/2015   TSH 0.56 02/04/2014   TSH 0.29* 10/05/2013   FREET4 0.74 02/10/2015   FREET4 0.96 02/04/2014   FREET4 0.77 10/03/2012     EXAM:  BP 118/76 mmHg  Pulse 71  Temp(Src) 97.9 F (36.6 C)  Resp 14  Ht 5' 2.75" (1.594 m)  Wt 151 lb 12.8 oz (68.856 kg)  BMI 27.10 kg/m2  SpO2 99%   Assessment/Plan:    1. Blood pressure is still normal without any medications She will continue to monitor periodically at home  2.  HYPERLIPIDEMIA: Has been controlled with combination medications, LDL74 and HDL relatively good also  3.  Impaired fasting glucose; highest 108 previously and now  normal at 99 Advised her to be consistent with diet and will check her fasting  glucose and A1c on the next visit Also advised her to continue  water aerobics  Karel Turpen 02/13/2015, 1:58 PM

## 2015-02-17 ENCOUNTER — Other Ambulatory Visit: Payer: Self-pay | Admitting: Endocrinology

## 2015-03-05 ENCOUNTER — Other Ambulatory Visit: Payer: Self-pay | Admitting: Gynecology

## 2015-03-05 ENCOUNTER — Ambulatory Visit (INDEPENDENT_AMBULATORY_CARE_PROVIDER_SITE_OTHER): Payer: Medicare Other

## 2015-03-05 ENCOUNTER — Ambulatory Visit (INDEPENDENT_AMBULATORY_CARE_PROVIDER_SITE_OTHER): Payer: Medicare Other | Admitting: Gynecology

## 2015-03-05 ENCOUNTER — Encounter: Payer: Self-pay | Admitting: Gynecology

## 2015-03-05 VITALS — BP 130/80

## 2015-03-05 DIAGNOSIS — Z8041 Family history of malignant neoplasm of ovary: Secondary | ICD-10-CM | POA: Diagnosis not present

## 2015-03-05 DIAGNOSIS — N8300 Follicular cyst of ovary, unspecified side: Secondary | ICD-10-CM

## 2015-03-05 NOTE — Progress Notes (Signed)
    80 year old presented to the office today for her annual screening ultrasound since her sister had ovarian cancer at the age of 43. Patient several years ago had abdominal hysterectomy.   Ultrasound November 2016: right ovarian echo-free thinwall avascular cyst 10 x 9 x 7 mm decreased in size. No fluid in the cul-de-sac. CA 125 7   Ultrasound today:  absent uterus. Vaginal cuff negative right ovary follicle 12 x 7 x 11 mm essentially no change from last year. Left ovary normal. Bilateral atrophic ovaries no fluid in cul-de-sac   assessment/plan: ultrasound today essentially negative in this postmenopausal patient who sister had ovarian cancer at the age of 67. A small insignificant benign-appearing follicle F been present over a year essentially unchanged. Patient asymptomatic. Patient otherwise scheduled to return back to the office at the end of the year for annual exam or when necessary.

## 2015-03-17 DIAGNOSIS — Z1211 Encounter for screening for malignant neoplasm of colon: Secondary | ICD-10-CM | POA: Diagnosis not present

## 2015-03-17 DIAGNOSIS — K641 Second degree hemorrhoids: Secondary | ICD-10-CM | POA: Diagnosis not present

## 2015-03-17 DIAGNOSIS — K648 Other hemorrhoids: Secondary | ICD-10-CM | POA: Diagnosis not present

## 2015-03-17 DIAGNOSIS — D124 Benign neoplasm of descending colon: Secondary | ICD-10-CM | POA: Diagnosis not present

## 2015-03-17 DIAGNOSIS — D126 Benign neoplasm of colon, unspecified: Secondary | ICD-10-CM | POA: Diagnosis not present

## 2015-03-17 DIAGNOSIS — K5730 Diverticulosis of large intestine without perforation or abscess without bleeding: Secondary | ICD-10-CM | POA: Diagnosis not present

## 2015-03-21 DIAGNOSIS — H16203 Unspecified keratoconjunctivitis, bilateral: Secondary | ICD-10-CM | POA: Diagnosis not present

## 2015-03-21 DIAGNOSIS — H25813 Combined forms of age-related cataract, bilateral: Secondary | ICD-10-CM | POA: Diagnosis not present

## 2015-03-21 DIAGNOSIS — H5203 Hypermetropia, bilateral: Secondary | ICD-10-CM | POA: Diagnosis not present

## 2015-03-21 DIAGNOSIS — H04123 Dry eye syndrome of bilateral lacrimal glands: Secondary | ICD-10-CM | POA: Diagnosis not present

## 2015-04-03 ENCOUNTER — Telehealth: Payer: Self-pay | Admitting: Endocrinology

## 2015-04-03 NOTE — Telephone Encounter (Signed)
Have not seen any forms and this is supposed to be completed by her pulmonologist

## 2015-04-03 NOTE — Telephone Encounter (Signed)
Dr Ronnie Derby pt

## 2015-04-03 NOTE — Telephone Encounter (Signed)
Barbara Maxwell with Dr. Kerin Perna office called following up with a form she had sent over about this pt's sleep apnea. She would like to speak with someone about this matter.

## 2015-04-03 NOTE — Telephone Encounter (Signed)
Please read message below. Are you aware of this form? Please advise.

## 2015-04-03 NOTE — Telephone Encounter (Signed)
Please read below...

## 2015-04-04 NOTE — Telephone Encounter (Signed)
Called Dr Kerin Perna office (dentist - [phone]). They were not open today. Need to call on Monday and advise them that they need to contact pt's pulmonologist.

## 2015-04-07 NOTE — Telephone Encounter (Signed)
Called Dr Katharine Look [facility] office and advised them per Dr Ronnie Derby message that forms need to be completed by pt's pulmonologist. Caryl Pina from their office stated she will let the clinic staff know.

## 2015-04-21 DIAGNOSIS — G4733 Obstructive sleep apnea (adult) (pediatric): Secondary | ICD-10-CM | POA: Diagnosis not present

## 2015-04-21 DIAGNOSIS — H9113 Presbycusis, bilateral: Secondary | ICD-10-CM | POA: Diagnosis not present

## 2015-04-21 DIAGNOSIS — H6123 Impacted cerumen, bilateral: Secondary | ICD-10-CM | POA: Diagnosis not present

## 2015-04-22 DIAGNOSIS — H2512 Age-related nuclear cataract, left eye: Secondary | ICD-10-CM | POA: Diagnosis not present

## 2015-04-22 DIAGNOSIS — H25032 Anterior subcapsular polar age-related cataract, left eye: Secondary | ICD-10-CM | POA: Diagnosis not present

## 2015-04-22 DIAGNOSIS — H269 Unspecified cataract: Secondary | ICD-10-CM | POA: Diagnosis not present

## 2015-05-13 DIAGNOSIS — H25011 Cortical age-related cataract, right eye: Secondary | ICD-10-CM | POA: Diagnosis not present

## 2015-05-13 DIAGNOSIS — H25811 Combined forms of age-related cataract, right eye: Secondary | ICD-10-CM | POA: Diagnosis not present

## 2015-05-13 DIAGNOSIS — H2511 Age-related nuclear cataract, right eye: Secondary | ICD-10-CM | POA: Diagnosis not present

## 2015-05-13 DIAGNOSIS — H25031 Anterior subcapsular polar age-related cataract, right eye: Secondary | ICD-10-CM | POA: Diagnosis not present

## 2015-05-26 ENCOUNTER — Other Ambulatory Visit: Payer: Self-pay | Admitting: Endocrinology

## 2015-07-29 DIAGNOSIS — Z85820 Personal history of malignant melanoma of skin: Secondary | ICD-10-CM | POA: Diagnosis not present

## 2015-07-29 DIAGNOSIS — S50862A Insect bite (nonvenomous) of left forearm, initial encounter: Secondary | ICD-10-CM | POA: Diagnosis not present

## 2015-08-11 ENCOUNTER — Other Ambulatory Visit (INDEPENDENT_AMBULATORY_CARE_PROVIDER_SITE_OTHER): Payer: Medicare Other

## 2015-08-11 DIAGNOSIS — R7301 Impaired fasting glucose: Secondary | ICD-10-CM | POA: Diagnosis not present

## 2015-08-11 DIAGNOSIS — Z862 Personal history of diseases of the blood and blood-forming organs and certain disorders involving the immune mechanism: Secondary | ICD-10-CM

## 2015-08-11 DIAGNOSIS — E782 Mixed hyperlipidemia: Secondary | ICD-10-CM

## 2015-08-11 LAB — LIPID PANEL
CHOL/HDL RATIO: 3
Cholesterol: 162 mg/dL (ref 0–200)
HDL: 51.3 mg/dL (ref >39.00)
LDL Cholesterol: 75 mg/dL (ref 0–99)
NonHDL: 110.92
TRIGLYCERIDES: 182 mg/dL — AB (ref 0.0–149.0)
VLDL: 36.4 mg/dL (ref 0.0–40.0)

## 2015-08-11 LAB — CBC
HCT: 39.6 % (ref 36.0–46.0)
Hemoglobin: 13.3 g/dL (ref 12.0–15.0)
MCHC: 33.7 g/dL (ref 30.0–36.0)
MCV: 85.2 fl (ref 78.0–100.0)
PLATELETS: 267 10*3/uL (ref 150.0–400.0)
RBC: 4.65 Mil/uL (ref 3.87–5.11)
RDW: 13.8 % (ref 11.5–15.5)
WBC: 5.7 10*3/uL (ref 4.0–10.5)

## 2015-08-11 LAB — COMPREHENSIVE METABOLIC PANEL
ALBUMIN: 4.2 g/dL (ref 3.5–5.2)
ALK PHOS: 50 U/L (ref 39–117)
ALT: 16 U/L (ref 0–35)
AST: 21 U/L (ref 0–37)
BILIRUBIN TOTAL: 1 mg/dL (ref 0.2–1.2)
BUN: 13 mg/dL (ref 6–23)
CALCIUM: 9.5 mg/dL (ref 8.4–10.5)
CO2: 26 meq/L (ref 19–32)
CREATININE: 0.76 mg/dL (ref 0.40–1.20)
Chloride: 108 mEq/L (ref 96–112)
GFR: 78.2 mL/min (ref >60.00)
Glucose, Bld: 95 mg/dL (ref 70–99)
Potassium: 4.1 mEq/L (ref 3.5–5.1)
Sodium: 143 mEq/L (ref 135–145)
TOTAL PROTEIN: 6.9 g/dL (ref 6.0–8.3)

## 2015-08-11 LAB — HEMOGLOBIN A1C: Hgb A1c MFr Bld: 6.1 % (ref 4.6–6.5)

## 2015-08-12 LAB — LIPOPROTEIN ANALYSIS BY NMR
HDL Particle Number: 43.4 umol/L (ref >=30.5)
LDL PARTICLE NUMBER: 1115 nmol/L — AB (ref <1000)
LDL SIZE: 20.3 nm (ref >20.5)
LP-IR Score: 64 — ABNORMAL HIGH (ref <=45)
Small LDL Particle Number: 532 nmol/L — ABNORMAL HIGH (ref <=527)

## 2015-08-14 ENCOUNTER — Ambulatory Visit: Payer: Medicare Other | Admitting: Endocrinology

## 2015-08-14 ENCOUNTER — Encounter: Payer: Self-pay | Admitting: Endocrinology

## 2015-08-14 ENCOUNTER — Ambulatory Visit (INDEPENDENT_AMBULATORY_CARE_PROVIDER_SITE_OTHER): Payer: Medicare Other | Admitting: Endocrinology

## 2015-08-14 VITALS — BP 126/72 | HR 77 | Ht 62.75 in | Wt 151.0 lb

## 2015-08-14 DIAGNOSIS — E782 Mixed hyperlipidemia: Secondary | ICD-10-CM | POA: Diagnosis not present

## 2015-08-14 DIAGNOSIS — E049 Nontoxic goiter, unspecified: Secondary | ICD-10-CM

## 2015-08-14 NOTE — Progress Notes (Signed)
Patient ID: Barbara Maxwell, female   DOB: 02-03-1936, 80 y.o.   MRN: [id number]    Chief complaint: Followup of various problems   History of Present Illness:   PROBLEM 1:   History of mild impaired fasting glucose, glucose is now 95, previously 99 Her weight is stable A1c was upper normal again She is usually fairly good with her diet and trying to exercise although inconsistent recently  Lab Results  Component Value Date   HGBA1C 6.1 08/11/2015   HGBA1C 6.1 02/10/2015   HGBA1C 6.3 10/05/2013   Lab Results  Component Value Date   LDLCALC 75 08/11/2015   CREATININE 0.76 08/11/2015    PROBLEM 2:  History of high blood pressure, previously treated since 1990 with various regimens. She was on 12.5 mg Toprol and 80 mg valsartan Since her blood pressure was low normal in 09/2013 her medications were stopped Blood pressure has been consistently normal since then Home readings are also fairly normal , Checked periodically   PROBLEM 3:  High lipids since 1980s. Highest Direct LDL 209.  Initially given Lipitor in 2001. Was well controlled with Lipitor 20 mg and no side effects. Had myopathy with Zocor.  However LDL had increased to 154 and was changed to Crestor in 6/04.  This was increased to 40 mg because of LDL particle number of 2343.  Zetia added in 2/09 also and LDL particle number was 1289 in 12/13.   LDL particle number was 1115 recently, previously better. She did not have consistent diet in the last month or so with eating out but is planning to get back to usual healthy diet  Triglycerides have been mildly high HDL is fairly good without niacin LDL near 70 again  Lipids are as follows:   Lab Results  Component Value Date   CHOL 162 08/11/2015   HDL 51.30 08/11/2015   LDLCALC 75 08/11/2015   TRIG 182.0* 08/11/2015   CHOLHDL 3 08/11/2015         Medication List       This list is accurate as of: 08/14/15  8:37 AM.  Always use your most recent med  list.               acetaminophen 325 MG tablet  Commonly known as:  TYLENOL  Take 650 mg by mouth every 6 (six) hours as needed for pain.     CALCIUM + D PO  Take 1 tablet by mouth daily.     cholecalciferol 400 units Tabs tablet  Commonly known as:  VITAMIN D  Take 5,000 Units by mouth.     CRESTOR 40 MG tablet  Generic drug:  rosuvastatin  TAKE 1 TABLET DAILY     Estradiol 10 MCG Tabs vaginal tablet  Commonly known as:  VAGIFEM  Place 1 tablet (10 mcg total) vaginally 2 (two) times a week.     ibuprofen 200 MG tablet  Commonly known as:  ADVIL,MOTRIN  Take 200 mg by mouth every 6 (six) hours as needed for pain.     meloxicam 15 MG tablet  Commonly known as:  MOBIC  Take 15 mg by mouth daily as needed for pain.     nitrofurantoin (macrocrystal-monohydrate) 100 MG capsule  Commonly known as:  MACROBID  Take 1 capsule (100 mg total) by mouth 2 (two) times daily.     sertraline 25 MG tablet  Commonly known as:  ZOLOFT  TAKE 1 TABLET DAILY     vitamin  C 500 MG tablet  Commonly known as:  ASCORBIC ACID  Take 500 mg by mouth daily.     ZETIA 10 MG tablet  Generic drug:  ezetimibe  TAKE 1 TABLET DAILY        Allergies:  Allergies  Allergen Reactions  . Bee Venom Anaphylaxis  . Simvastatin     Myopathy  . Codeine Nausea Only  . Evista [Raloxifene]     Hot flashes  . Percocet [Oxycodone-Acetaminophen] Nausea And Vomiting    Nausea and vomiting    Past Medical History  Diagnosis Date  . Depression   . Hypertension   . Hypercholesterolemia   . Cancer [facility])     SMALL PLACE-  MELANOMA REMOVED FROM BACK    Episode of severe anxiety/amnesia 10/08.  Past Surgical History  Procedure Laterality Date  . Tonsillectomy and adenoidectomy    . Appendectomy    . Abdominal hysterectomy      VAGINAL HYSTERECTOMY WITH OVARIAN PRESERVATION   . Breast surgery  1985    BREAST REDUCTION   . Abdominoplasty  1998    Family History  Problem Relation Age of Onset   . Hypertension Mother   . Heart disease Father   . Diabetes Father   . Cancer Sister 21    OVARIAN   . Thyroid disease Sister   . Heart disease Brother   . Thyroid disease Daughter   . Diabetes Paternal Grandmother      Social History:  reports that she has never smoked. She has never used smokeless tobacco. She reports that she does not drink alcohol. Her drug history is not on file.  Review of Systems -   HISTORY of osteopenia: Recent bone density from gynecologist is normal  History of anemia.  Hb 11.1 in 1/14 associated with iron deficiency.  Anemia has been resolved now Hemoccult negative in 4/16  Lab Results  Component Value Date   WBC 5.7 08/11/2015   HGB 13.3 08/11/2015   HCT 39.6 08/11/2015   MCV 85.2 08/11/2015   PLT 267.0 08/11/2015    She had problems with osteoarthritis of her knees which is better but she  takes Mobic prn Commonly for back pain    Vitamin D deficiency: The baseline level was 20. The last level was normal checked by gynecologist in 11/16 She has been taking 2000 units daily  History of goiter: She continues to be euthyroid  Lab Results  Component Value Date   TSH 0.53 02/10/2015   TSH 0.56 02/04/2014   TSH 0.29* 10/05/2013   FREET4 0.74 02/10/2015   FREET4 0.96 02/04/2014   FREET4 0.77 10/03/2012     EXAM:  BP 126/72 mmHg  Pulse 77  Ht 5' 2.75" (1.594 m)  Wt 151 lb (68.493 kg)  BMI 26.96 kg/m2  SpO2 94%  Right lobe of the thyroid is enlarged about 2-1/2 times normal, smooth, relatively soft.  Left side not clearly palpable No pallor No edema  Assessment/Plan:    1. Blood pressure is consistently normal without any medications She will continue to monitor periodically at home  2.  HYPERLIPIDEMIA: Has had LDL controlled with combination of 40 mg Lipitor and Zetia, LDL75 and HDL relatively good .  However triglycerides are slightly higher and LDL particle number has increased, likely to be from inconsistent died  recently  3.  Impaired fasting glucose; highest 108 previously and now  normal at 95 with upper normal A1c She will try to be consistent with diet.  Also advised her to continue  water aerobics  4.  History of anemia, resolved.  She does not need to take vitamin C  5.  Vitamin D deficiency: Followed by gynecologist annually      [facility] 08/14/2015, 8:37 AM

## 2015-08-14 NOTE — Patient Instructions (Signed)
May use 1/2 Meloxicam  No need for Vit C

## 2015-08-18 DIAGNOSIS — Z1231 Encounter for screening mammogram for malignant neoplasm of breast: Secondary | ICD-10-CM | POA: Diagnosis not present

## 2015-09-01 ENCOUNTER — Encounter: Payer: Self-pay | Admitting: Endocrinology

## 2015-09-26 DIAGNOSIS — H0011 Chalazion right upper eyelid: Secondary | ICD-10-CM | POA: Diagnosis not present

## 2015-11-14 ENCOUNTER — Other Ambulatory Visit: Payer: Self-pay | Admitting: Endocrinology

## 2015-12-04 DIAGNOSIS — Z85820 Personal history of malignant melanoma of skin: Secondary | ICD-10-CM | POA: Diagnosis not present

## 2015-12-04 DIAGNOSIS — D2261 Melanocytic nevi of right upper limb, including shoulder: Secondary | ICD-10-CM | POA: Diagnosis not present

## 2015-12-04 DIAGNOSIS — D1801 Hemangioma of skin and subcutaneous tissue: Secondary | ICD-10-CM | POA: Diagnosis not present

## 2015-12-04 DIAGNOSIS — D2272 Melanocytic nevi of left lower limb, including hip: Secondary | ICD-10-CM | POA: Diagnosis not present

## 2015-12-04 DIAGNOSIS — D2262 Melanocytic nevi of left upper limb, including shoulder: Secondary | ICD-10-CM | POA: Diagnosis not present

## 2015-12-04 DIAGNOSIS — L821 Other seborrheic keratosis: Secondary | ICD-10-CM | POA: Diagnosis not present

## 2015-12-04 DIAGNOSIS — L814 Other melanin hyperpigmentation: Secondary | ICD-10-CM | POA: Diagnosis not present

## 2015-12-04 DIAGNOSIS — D2239 Melanocytic nevi of other parts of face: Secondary | ICD-10-CM | POA: Diagnosis not present

## 2015-12-04 DIAGNOSIS — D225 Melanocytic nevi of trunk: Secondary | ICD-10-CM | POA: Diagnosis not present

## 2015-12-30 ENCOUNTER — Ambulatory Visit (INDEPENDENT_AMBULATORY_CARE_PROVIDER_SITE_OTHER): Payer: Medicare Other

## 2015-12-30 DIAGNOSIS — Z23 Encounter for immunization: Secondary | ICD-10-CM | POA: Diagnosis not present

## 2016-01-22 ENCOUNTER — Ambulatory Visit (INDEPENDENT_AMBULATORY_CARE_PROVIDER_SITE_OTHER): Payer: Medicare Other | Admitting: Gynecology

## 2016-01-22 ENCOUNTER — Encounter: Payer: Self-pay | Admitting: Gynecology

## 2016-01-22 VITALS — BP 118/80 | Ht 62.0 in | Wt 155.0 lb

## 2016-01-22 DIAGNOSIS — N952 Postmenopausal atrophic vaginitis: Secondary | ICD-10-CM

## 2016-01-22 DIAGNOSIS — Z01419 Encounter for gynecological examination (general) (routine) without abnormal findings: Secondary | ICD-10-CM

## 2016-01-22 DIAGNOSIS — M8580 Other specified disorders of bone density and structure, unspecified site: Secondary | ICD-10-CM

## 2016-01-22 DIAGNOSIS — Z8041 Family history of malignant neoplasm of ovary: Secondary | ICD-10-CM | POA: Diagnosis not present

## 2016-01-22 NOTE — Patient Instructions (Signed)
CA-125 Tumor Marker Test Why am I having this test? This test is used to check the level of cancer antigen 125 (CA-125) in your blood. The CA-125 tumor marker test can be helpful in detecting ovarian cancer. The test is only performed if you are considered at high risk for ovarian cancer. Your health care provider may recommend this test if:  You have a strong family history of ovarian cancer.  You have a breast cancer antigen (BRCA) genetic defect. If you have already been diagnosed with ovarian cancer, your health care provider may use this test to help identify the extent of the disease and to monitor your response to treatment. What kind of sample is taken? A blood sample is required for this test. It is usually collected by inserting a needle into a vein. How do I prepare for this test? There is no preparation required for this test. What are the reference ranges? Reference ranges are considered healthy ranges established after testing a large group of healthy people. Reference ranges may vary among different people, labs, and hospitals. It is your responsibility to obtain your test results. Ask the lab or department performing the test when and how you will get your results. The reference range for this test is 0-35 units/mL or less than 35 kunits/L (SI units). What do the results mean? Increased levels of CA-125 may indicate:  Certain types of cancer, including:  Ovarian cancer.  Pancreatic cancer.  Colon cancer.  Lung cancer.  Breast cancer.  Lymphoma.  Noncancerous (benign) disorders, including:  Cirrhosis.  Pregnancy.  Endometriosis.  Pancreatitis.  Pelvic inflammatory disease (PID). Talk with your health care provider to discuss your results, treatment options, and if necessary, the need for more tests. Talk with your health care provider if you have any questions about your results. Talk with your health care provider to discuss your results, treatment options,  and if necessary, the need for more tests. Talk with your health care provider if you have any questions about your results. This information is not intended to replace advice given to you by your health care provider. Make sure you discuss any questions you have with your health care provider. Document Released: 03/02/2004 Document Revised: 10/14/2015 Document Reviewed: 06/28/2013 Elsevier Interactive Patient Education  2017 Elsevier Inc.  

## 2016-01-22 NOTE — Progress Notes (Signed)
Barbara Maxwell 11-Dec-1935 [id number]   History:    80 y.o.  for annual gyn exam with no complaints today. Patient with past history of transvaginal hysterectomy secondary to fibroid uterus and menorrhagia.Patient's sister was diagnosed with ovarian cancer at the age of 47 and for this reason we have been doing annual ultrasounds and CA 125.Her colonoscopy was normal in 2006 done by Dr. Earlean Shawl. Patient with no previous history of abnormal Pap smears. Mammogram May of this year was normal. Patient's last bone density study was in 2014 with history of osteopenia that her PCP has been following. She has had history vitamin D deficiency in the past.Patient with history of melanoma in her back in 2001 had been followed by dermatologist Dr. Otho Bellows and a wide excision done by Dr. Craig Staggers. Patient also has had history of breast reduction as well as abdominoplasty several years ago. Patient has had the shinglkes and Tdap vaccine are up-to-date. Patient is on Vagifem 10 g tablet twice a week for vaginal atrophy and has done well.  Dr. Dwyane Dee is her PCP and has been doing her blood work   Past medical history,surgical history, family history and social history were all reviewed and documented in the EPIC chart.  Gynecologic History No LMP recorded. Patient is postmenopausal. Contraception: none Last Pap: Several years ago. Results were: normal Last mammogram: 2017. Results were: normal  Obstetric History OB History  Gravida Para Term Preterm AB Living  4 4       4   SAB TAB Ectopic Multiple Live Births               # Outcome Date GA Lbr Len/2nd Weight Sex Delivery Anes PTL Lv  4 Para           3 Para           2 Para           1 Para                ROS: A ROS was performed and pertinent positives and negatives are included in the history.  GENERAL: No fevers or chills. HEENT: No change in vision, no earache, sore throat or sinus congestion. NECK: No pain or stiffness. CARDIOVASCULAR: No  chest pain or pressure. No palpitations. PULMONARY: No shortness of breath, cough or wheeze. GASTROINTESTINAL: No abdominal pain, nausea, vomiting or diarrhea, melena or bright red blood per rectum. GENITOURINARY: No urinary frequency, urgency, hesitancy or dysuria. MUSCULOSKELETAL: No joint or muscle pain, no back pain, no recent trauma. DERMATOLOGIC: No rash, no itching, no lesions. ENDOCRINE: No polyuria, polydipsia, no heat or cold intolerance. No recent change in weight. HEMATOLOGICAL: No anemia or easy bruising or bleeding. NEUROLOGIC: No headache, seizures, numbness, tingling or weakness. PSYCHIATRIC: No depression, no loss of interest in normal activity or change in sleep pattern.     Exam: chaperone present  BP 118/80   Ht 5\' 2"  (1.575 m)   Wt 155 lb (70.3 kg)   BMI 28.35 kg/m   Body mass index is 28.35 kg/m.  General appearance : Well developed well nourished female. No acute distress HEENT: Eyes: no retinal hemorrhage or exudates,  Neck supple, trachea midline, no carotid bruits, no thyroidmegaly Lungs: Clear to auscultation, no rhonchi or wheezes, or rib retractions  Heart: Regular rate and rhythm, no murmurs or gallops Breast:Examined in sitting and supine position were symmetrical in appearance, no palpable masses or tenderness,  no skin retraction, no nipple inversion, no nipple  discharge, no skin discoloration, no axillary or supraclavicular lymphadenopathy Abdomen: no palpable masses or tenderness, no rebound or guarding Extremities: no edema or skin discoloration or tenderness  Pelvic:  Bartholin, Urethra, Skene Glands: Within normal limits             Vagina: No gross lesions or discharge  Cervix: Absent  Uterus  absent  Adnexa  Without masses or tenderness  Anus and perineum  normal   Rectovaginal  normal sphincter tone without palpated masses or tenderness             Hemoccult PCP provides     Assessment/Plan:  80 y.o. female for annual exam patient stop by  the lab to check her CA 125 and then also to schedule her for an ultrasound as part of her screening for ovarian cancer because of her sister. Patient knows limitation of the screening. Pap smear no longer indicated. Her PCP we'll be scheduling her bone density study next year. All her vaccinations blood work are up-to-date by her PCP.   Terrance Mass MD, 2:12 PM 01/22/2016

## 2016-01-23 LAB — CA 125: CA 125: 7 U/mL (ref <35)

## 2016-02-05 ENCOUNTER — Other Ambulatory Visit: Payer: Medicare Other

## 2016-02-06 ENCOUNTER — Telehealth: Payer: Self-pay

## 2016-02-06 ENCOUNTER — Other Ambulatory Visit: Payer: Self-pay

## 2016-02-06 ENCOUNTER — Other Ambulatory Visit (INDEPENDENT_AMBULATORY_CARE_PROVIDER_SITE_OTHER): Payer: Medicare Other

## 2016-02-06 DIAGNOSIS — E049 Nontoxic goiter, unspecified: Secondary | ICD-10-CM

## 2016-02-06 DIAGNOSIS — E782 Mixed hyperlipidemia: Secondary | ICD-10-CM | POA: Diagnosis not present

## 2016-02-06 LAB — LIPID PANEL
CHOL/HDL RATIO: 3
Cholesterol: 139 mg/dL (ref 0–200)
HDL: 47.1 mg/dL (ref >39.00)
LDL CALC: 67 mg/dL (ref 0–99)
NONHDL: 92.28
Triglycerides: 125 mg/dL (ref 0.0–149.0)
VLDL: 25 mg/dL (ref 0.0–40.0)

## 2016-02-06 LAB — COMPREHENSIVE METABOLIC PANEL
ALT: 15 U/L (ref 0–35)
AST: 23 U/L (ref 0–37)
Albumin: 4.5 g/dL (ref 3.5–5.2)
Alkaline Phosphatase: 52 U/L (ref 39–117)
BUN: 13 mg/dL (ref 6–23)
CALCIUM: 9.7 mg/dL (ref 8.4–10.5)
CHLORIDE: 106 meq/L (ref 96–112)
CO2: 29 meq/L (ref 19–32)
CREATININE: 0.84 mg/dL (ref 0.40–1.20)
GFR: 69.58 mL/min (ref >60.00)
Glucose, Bld: 101 mg/dL — ABNORMAL HIGH (ref 70–99)
POTASSIUM: 4.2 meq/L (ref 3.5–5.1)
SODIUM: 142 meq/L (ref 135–145)
Total Bilirubin: 0.7 mg/dL (ref 0.2–1.2)
Total Protein: 7.2 g/dL (ref 6.0–8.3)

## 2016-02-06 LAB — TSH: TSH: 0.94 u[IU]/mL (ref 0.35–4.50)

## 2016-02-06 MED ORDER — AZITHROMYCIN 250 MG PO TABS: ORAL_TABLET | ORAL | 0 refills | Status: DC

## 2016-02-06 NOTE — Telephone Encounter (Signed)
Ordered 02/06/16

## 2016-02-06 NOTE — Telephone Encounter (Signed)
Patient would like for you to call in a zpak for her she has had a bad cold for a couple weeks and is now with a deep productive cough which is green in nature- please advise

## 2016-02-06 NOTE — Telephone Encounter (Signed)
Please call in Z-Pack 250 mg

## 2016-02-09 ENCOUNTER — Encounter: Payer: Medicare Other | Admitting: Gynecology

## 2016-02-09 ENCOUNTER — Ambulatory Visit: Payer: Medicare Other

## 2016-02-10 ENCOUNTER — Ambulatory Visit (INDEPENDENT_AMBULATORY_CARE_PROVIDER_SITE_OTHER): Payer: Medicare Other | Admitting: Endocrinology

## 2016-02-10 ENCOUNTER — Encounter: Payer: Self-pay | Admitting: Endocrinology

## 2016-02-10 VITALS — BP 127/80 | HR 71 | Ht 62.0 in | Wt 153.0 lb

## 2016-02-10 DIAGNOSIS — R7301 Impaired fasting glucose: Secondary | ICD-10-CM

## 2016-02-10 DIAGNOSIS — E559 Vitamin D deficiency, unspecified: Secondary | ICD-10-CM

## 2016-02-10 DIAGNOSIS — E782 Mixed hyperlipidemia: Secondary | ICD-10-CM

## 2016-02-10 DIAGNOSIS — Z862 Personal history of diseases of the blood and blood-forming organs and certain disorders involving the immune mechanism: Secondary | ICD-10-CM

## 2016-02-10 NOTE — Patient Instructions (Signed)
Stay active.

## 2016-02-10 NOTE — Progress Notes (Signed)
Patient ID: Barbara Maxwell, female   DOB: 1935-05-20, 80 y.o.   MRN: [id number]    Chief complaint: Followup of various problems   History of Present Illness:   PROBLEM 1:   History of mild impaired fasting glucose, glucose is now 101, previously 95 Her weight is stable A1c was upper normal on her last visit  She is usually fairly good with her diet although inconsistent recently She was previously doing water aerobics and now is only doing some walking  Wt Readings from Last 3 Encounters:  02/10/16 153 lb (69.4 kg)  01/22/16 155 lb (70.3 kg)  08/14/15 151 lb (68.5 kg)    Lab Results  Component Value Date   HGBA1C 6.1 08/11/2015   HGBA1C 6.1 02/10/2015   HGBA1C 6.3 10/05/2013   Lab Results  Component Value Date   LDLCALC 67 02/06/2016   CREATININE 0.84 02/06/2016    PROBLEM 2:  History of high blood pressure, previously treated since 1990 with various regimens. She was on 12.5 mg Toprol and 80 mg valsartan Since her blood pressure was low normal in 09/2013 her medications were stopped Blood pressure has been consistently normal since then Home readings are also normal And she checks regularly    PROBLEM 3:  High lipids since 1980s. Highest Direct LDL 209.  Initially given Lipitor in 2001. Was well controlled with Lipitor 20 mg and no side effects. Had myopathy with Zocor.   However LDL had increased to 154 and was changed to Crestor in 6/04.  This was increased to 40 mg because of LDL particle number of 2343.  Zetia added in 2/09 also and LDL particle number was 1289 in 12/13.   LDL particle number was 1115 last, previously better.  Triglycerides have been mildly high, now improved HDL is fairly good without niacin LDL near 70 again and slightly better than before  Lipids are as follows:   Lab Results  Component Value Date   CHOL 139 02/06/2016   HDL 47.10 02/06/2016   LDLCALC 67 02/06/2016   TRIG 125.0 02/06/2016   CHOLHDL 3 02/06/2016        Allergies as of 02/10/2016      Reactions   Bee Venom Anaphylaxis   Simvastatin    Myopathy   Codeine Nausea Only   Evista [raloxifene]    Hot flashes   Percocet [oxycodone-acetaminophen] Nausea And Vomiting   Nausea and vomiting      Medication List       Accurate as of 02/10/16  8:37 AM. Always use your most recent med list.          acetaminophen 325 MG tablet Commonly known as:  TYLENOL Take 650 mg by mouth every 6 (six) hours as needed for pain.   azithromycin 250 MG tablet Commonly known as:  ZITHROMAX Take 2 tablets the first day and 1 tablet a day until gone   CALCIUM + D PO Take 1 tablet by mouth daily.   cholecalciferol 400 units Tabs tablet Commonly known as:  VITAMIN D Take 5,000 Units by mouth.   CRESTOR 40 MG tablet Generic drug:  rosuvastatin TAKE 1 TABLET DAILY   Estradiol 10 MCG Tabs vaginal tablet Commonly known as:  VAGIFEM Place 1 tablet (10 mcg total) vaginally 2 (two) times a week.   meloxicam 15 MG tablet Commonly known as:  MOBIC Take 15 mg by mouth daily as needed for pain.   sertraline 25 MG tablet Commonly known as:  ZOLOFT  TAKE 1 TABLET DAILY   vitamin C 500 MG tablet Commonly known as:  ASCORBIC ACID Take 500 mg by mouth daily.   ZETIA 10 MG tablet Generic drug:  ezetimibe TAKE 1 TABLET DAILY       Allergies:  Allergies  Allergen Reactions  . Bee Venom Anaphylaxis  . Simvastatin     Myopathy  . Codeine Nausea Only  . Evista [Raloxifene]     Hot flashes  . Percocet [Oxycodone-Acetaminophen] Nausea And Vomiting    Nausea and vomiting    Past Medical History:  Diagnosis Date  . Cancer ([place])    SMALL PLACE-  MELANOMA REMOVED FROM BACK   . Depression   . Hypercholesterolemia   . Hypertension    Episode of severe anxiety/amnesia 10/08.  Past Surgical History:  Procedure Laterality Date  . ABDOMINAL HYSTERECTOMY     VAGINAL HYSTERECTOMY WITH OVARIAN PRESERVATION   . ABDOMINOPLASTY  1998  .  APPENDECTOMY    . BREAST SURGERY  1985   BREAST REDUCTION   . TONSILLECTOMY AND ADENOIDECTOMY      Family History  Problem Relation Age of Onset  . Hypertension Mother   . Heart disease Father   . Diabetes Father   . Cancer Sister 52    OVARIAN   . Thyroid disease Sister   . Heart disease Brother   . Thyroid disease Daughter   . Diabetes Paternal Grandmother      Social History:  reports that she has never smoked. She has never used smokeless tobacco. She reports that she does not drink alcohol. Her drug history is not on file.  Review of Systems -   HISTORY of osteopenia: bone density from gynecologist is normal in 12/16  History of anemia.  Hb 11.1 in 1/14 associated with iron deficiency.  Anemia has been resolved  Hemoccult negative in 4/16  Lab Results  Component Value Date   WBC 5.7 08/11/2015   HGB 13.3 08/11/2015   HCT 39.6 08/11/2015   MCV 85.2 08/11/2015   PLT 267.0 08/11/2015    She had problems with osteoarthritis of her knees       Vitamin D deficiency: The baseline level was 20. The last level was normal checked by gynecologist in 11/16 She has been taking 2000 units daily  History of goiter: She continues to be euthyroid  Lab Results  Component Value Date   TSH 0.94 02/06/2016   TSH 0.53 02/10/2015   TSH 0.56 02/04/2014   FREET4 0.74 02/10/2015   FREET4 0.96 02/04/2014   FREET4 0.77 10/03/2012     EXAM:  BP 127/80   Pulse 71   Ht 5\' 2"  (1.575 m)   Wt 153 lb (69.4 kg)   SpO2 94%   BMI 27.98 kg/m      Assessment/Plan:    1.   HYPERLIPIDEMIA: Has had Good control of LDL  with combination of 40 mg CRESTOR and Zetia Triglycerides are back to normal  2.  History of osteopenia: She will have bone density done by gynecologist next December  3.  Impaired fasting glucose; highest 108 previously and now borderline at 101 She will try to be consistent with diet.  Also advised her to restart  water aerobics  4.  History of anemia,  resolved.    5.  Vitamin D deficiency: Needs follow-up level  Wellness visit on the next follow-up      Damieon Armendariz 02/10/2016, 8:37 AM

## 2016-02-27 ENCOUNTER — Ambulatory Visit: Payer: Medicare Other | Admitting: Gynecology

## 2016-02-27 ENCOUNTER — Other Ambulatory Visit: Payer: Medicare Other

## 2016-03-04 ENCOUNTER — Other Ambulatory Visit: Payer: Self-pay | Admitting: Gynecology

## 2016-03-04 ENCOUNTER — Ambulatory Visit (INDEPENDENT_AMBULATORY_CARE_PROVIDER_SITE_OTHER): Payer: Medicare Other | Admitting: Gynecology

## 2016-03-04 ENCOUNTER — Ambulatory Visit (INDEPENDENT_AMBULATORY_CARE_PROVIDER_SITE_OTHER): Payer: Medicare Other

## 2016-03-04 ENCOUNTER — Encounter: Payer: Self-pay | Admitting: Gynecology

## 2016-03-04 VITALS — BP 120/82 | Ht 62.0 in | Wt 153.0 lb

## 2016-03-04 DIAGNOSIS — N83201 Unspecified ovarian cyst, right side: Secondary | ICD-10-CM | POA: Diagnosis not present

## 2016-03-04 DIAGNOSIS — Z8041 Family history of malignant neoplasm of ovary: Secondary | ICD-10-CM

## 2016-03-04 NOTE — Progress Notes (Signed)
   Patient is a 81 year old who was seen in the office in 01/22/2016 for her annual exam see previous note for detail. She is here today for an ultrasound due to the fact that she had mentioned that her sister had been diagnosed with ovarian cancer at the age of 28 and for several years with been following her closely with ultrasounds and CA 125 and all been normal with exception of very small thinwall echo-free cyst measuring 10 x 10 mm on the right ovary. She is otherwise been asymptomatic.Patient with past history of transvaginal hysterectomy secondary to fibroid uterus and menorrhagia.  Ultrasound today: Absent uterus, right ovarian thinwall echo-free cyst 10 x 10 mm. Left ovary not seen. Excessive bowel shadowing. Left adnexa. No apparent left adnexal mass.    Assessment/plan: 81 year old patient who sister had been diagnosed with ovarian cancer in her late 34s or early 72s. Patient been followed with ultrasounds since 2014 and essentially unremarkable unchanged small follicle left ovary and normal CA 125. Patient was given the option of continued to do this yearly or not necessarily continue the surveillance. She is otherwise asymptomatic and is due for her annual exam in one year or when necessary.  Greater than 90% time was spent counseling [place] care for this patient. Total time of consultation 10 minutes

## 2016-03-15 NOTE — Progress Notes (Signed)
This encounter was created in error - please disregard.

## 2016-04-01 ENCOUNTER — Other Ambulatory Visit: Payer: Self-pay | Admitting: Gynecology

## 2016-04-01 NOTE — Telephone Encounter (Signed)
Per note on 01/22/16 "Patient is on Vagifem 10 g tablet twice a week for vaginal atrophy and has done well."

## 2016-05-22 ENCOUNTER — Other Ambulatory Visit: Payer: Self-pay | Admitting: Endocrinology

## 2016-06-21 DIAGNOSIS — H43813 Vitreous degeneration, bilateral: Secondary | ICD-10-CM | POA: Diagnosis not present

## 2016-06-21 DIAGNOSIS — H5213 Myopia, bilateral: Secondary | ICD-10-CM | POA: Diagnosis not present

## 2016-06-21 DIAGNOSIS — Z961 Presence of intraocular lens: Secondary | ICD-10-CM | POA: Diagnosis not present

## 2016-06-21 DIAGNOSIS — H02834 Dermatochalasis of left upper eyelid: Secondary | ICD-10-CM | POA: Diagnosis not present

## 2016-07-07 ENCOUNTER — Encounter: Payer: Self-pay | Admitting: Gynecology

## 2016-08-03 DIAGNOSIS — S20461A Insect bite (nonvenomous) of right back wall of thorax, initial encounter: Secondary | ICD-10-CM | POA: Diagnosis not present

## 2016-08-03 DIAGNOSIS — Z85820 Personal history of malignant melanoma of skin: Secondary | ICD-10-CM | POA: Diagnosis not present

## 2016-08-03 DIAGNOSIS — D225 Melanocytic nevi of trunk: Secondary | ICD-10-CM | POA: Diagnosis not present

## 2016-08-03 DIAGNOSIS — D1801 Hemangioma of skin and subcutaneous tissue: Secondary | ICD-10-CM | POA: Diagnosis not present

## 2016-08-03 DIAGNOSIS — L814 Other melanin hyperpigmentation: Secondary | ICD-10-CM | POA: Diagnosis not present

## 2016-08-06 ENCOUNTER — Other Ambulatory Visit (INDEPENDENT_AMBULATORY_CARE_PROVIDER_SITE_OTHER): Payer: Medicare Other

## 2016-08-06 DIAGNOSIS — R7301 Impaired fasting glucose: Secondary | ICD-10-CM | POA: Diagnosis not present

## 2016-08-06 DIAGNOSIS — E782 Mixed hyperlipidemia: Secondary | ICD-10-CM | POA: Diagnosis not present

## 2016-08-06 DIAGNOSIS — Z862 Personal history of diseases of the blood and blood-forming organs and certain disorders involving the immune mechanism: Secondary | ICD-10-CM

## 2016-08-06 DIAGNOSIS — E559 Vitamin D deficiency, unspecified: Secondary | ICD-10-CM

## 2016-08-06 LAB — CBC
HCT: 39.5 % (ref 36.0–46.0)
Hemoglobin: 13 g/dL (ref 12.0–15.0)
MCHC: 32.8 g/dL (ref 30.0–36.0)
MCV: 86.3 fl (ref 78.0–100.0)
PLATELETS: 258 10*3/uL (ref 150.0–400.0)
RBC: 4.58 Mil/uL (ref 3.87–5.11)
RDW: 13.8 % (ref 11.5–15.5)
WBC: 6.7 10*3/uL (ref 4.0–10.5)

## 2016-08-06 LAB — COMPREHENSIVE METABOLIC PANEL
ALT: 14 U/L (ref 0–35)
AST: 22 U/L (ref 0–37)
Albumin: 4.3 g/dL (ref 3.5–5.2)
Alkaline Phosphatase: 50 U/L (ref 39–117)
BILIRUBIN TOTAL: 1.1 mg/dL (ref 0.2–1.2)
BUN: 22 mg/dL (ref 6–23)
CO2: 29 meq/L (ref 19–32)
CREATININE: 0.84 mg/dL (ref 0.40–1.20)
Calcium: 9.8 mg/dL (ref 8.4–10.5)
Chloride: 107 mEq/L (ref 96–112)
GFR: 69.49 mL/min (ref >60.00)
GLUCOSE: 101 mg/dL — AB (ref 70–99)
Potassium: 4.7 mEq/L (ref 3.5–5.1)
Sodium: 142 mEq/L (ref 135–145)
Total Protein: 6.9 g/dL (ref 6.0–8.3)

## 2016-08-06 LAB — LIPID PANEL
CHOL/HDL RATIO: 3
Cholesterol: 145 mg/dL (ref 0–200)
HDL: 48.1 mg/dL (ref >39.00)
LDL Cholesterol: 69 mg/dL (ref 0–99)
NONHDL: 97.22
Triglycerides: 143 mg/dL (ref 0.0–149.0)
VLDL: 28.6 mg/dL (ref 0.0–40.0)

## 2016-08-06 LAB — HEMOGLOBIN A1C: HEMOGLOBIN A1C: 6.2 % (ref 4.6–6.5)

## 2016-08-06 LAB — VITAMIN D 25 HYDROXY (VIT D DEFICIENCY, FRACTURES): VITD: 42.8 ng/mL (ref 30.00–100.00)

## 2016-08-08 ENCOUNTER — Other Ambulatory Visit: Payer: Self-pay | Admitting: Endocrinology

## 2016-08-11 ENCOUNTER — Encounter: Payer: Self-pay | Admitting: Endocrinology

## 2016-08-11 ENCOUNTER — Ambulatory Visit (INDEPENDENT_AMBULATORY_CARE_PROVIDER_SITE_OTHER): Payer: Medicare Other | Admitting: Endocrinology

## 2016-08-11 VITALS — BP 122/72 | HR 69 | Ht 62.0 in | Wt 155.0 lb

## 2016-08-11 DIAGNOSIS — E782 Mixed hyperlipidemia: Secondary | ICD-10-CM | POA: Diagnosis not present

## 2016-08-11 DIAGNOSIS — R7301 Impaired fasting glucose: Secondary | ICD-10-CM

## 2016-08-11 DIAGNOSIS — Z862 Personal history of diseases of the blood and blood-forming organs and certain disorders involving the immune mechanism: Secondary | ICD-10-CM

## 2016-08-11 DIAGNOSIS — E049 Nontoxic goiter, unspecified: Secondary | ICD-10-CM | POA: Diagnosis not present

## 2016-08-11 NOTE — Progress Notes (Signed)
Patient ID: Barbara Maxwell, female   DOB: 08-13-1935, 81 y.o.   MRN: [id number]    Chief complaint: Followup of various problems   History of Present Illness:   PROBLEM 1: PREDIABETES:   History of mild impaired fasting glucose, glucose is again 101, previously 95 Her weight is stable A1c has been upper normal  She is usually fairly good with her diet although inconsistent at times  She was previously doing water aerobics and is only doing some walking  Wt Readings from Last 3 Encounters:  08/11/16 155 lb (70.3 kg)  03/04/16 153 lb (69.4 kg)  02/10/16 153 lb (69.4 kg)    Lab Results  Component Value Date   HGBA1C 6.2 08/06/2016   HGBA1C 6.1 08/11/2015   HGBA1C 6.1 02/10/2015   Lab Results  Component Value Date   LDLCALC 69 08/06/2016   CREATININE 0.84 08/06/2016    PROBLEM 2:  History of high blood pressure, previously treated since 1990 with various regimens. She was on 12.5 mg Toprol and 80 mg valsartan Since her blood pressure was low normal in 09/2013 her medications were stopped  Blood pressure has been consistently normal since then Home readings are also normal    PROBLEM 3:  High lipids since 1980s. Highest Direct LDL 209.  Initially given Lipitor in 2001. Was well controlled with Lipitor 20 mg and no side effects. Had myopathy with Zocor.   However LDL had increased to 154 and was changed to Crestor in 6/04.  This was increased to 40 mg because of LDL particle number of 2343.  Zetia added in 2/09 also and LDL particle number was 1289 in 12/13.   LDL particle number was 1115 last, previously better.  Triglycerides have been previously high HDL is fairly good without niacin LDL near 70 again  Lipids are as follows:   Lab Results  Component Value Date   CHOL 145 08/06/2016   HDL 48.10 08/06/2016   LDLCALC 69 08/06/2016   TRIG 143.0 08/06/2016   CHOLHDL 3 08/06/2016       Allergies as of 08/11/2016      Reactions   Bee Venom  Anaphylaxis   Simvastatin    Myopathy   Codeine Nausea Only   Evista [raloxifene]    Hot flashes   Percocet [oxycodone-acetaminophen] Nausea And Vomiting   Nausea and vomiting      Medication List       Accurate as of 08/11/16  9:00 AM. Always use your most recent med list.          acetaminophen 325 MG tablet Commonly known as:  TYLENOL Take 650 mg by mouth every 6 (six) hours as needed for pain.   CALCIUM + D PO Take 1 tablet by mouth daily.   cholecalciferol 400 units Tabs tablet Commonly known as:  VITAMIN D Take 5,000 Units by mouth.   ezetimibe 10 MG tablet Commonly known as:  ZETIA TAKE 1 TABLET DAILY   meloxicam 15 MG tablet Commonly known as:  MOBIC Take 15 mg by mouth daily as needed for pain.   rosuvastatin 40 MG tablet Commonly known as:  CRESTOR TAKE 1 TABLET DAILY   sertraline 25 MG tablet Commonly known as:  ZOLOFT TAKE 1 TABLET DAILY   vitamin C 500 MG tablet Commonly known as:  ASCORBIC ACID Take 500 mg by mouth daily.   YUVAFEM 10 MCG Tabs vaginal tablet Generic drug:  Estradiol INSERT 1 TABLET VAGINALLY TWICE WEEKLY  Allergies:  Allergies  Allergen Reactions  . Bee Venom Anaphylaxis  . Simvastatin     Myopathy  . Codeine Nausea Only  . Evista [Raloxifene]     Hot flashes  . Percocet [Oxycodone-Acetaminophen] Nausea And Vomiting    Nausea and vomiting    Past Medical History:  Diagnosis Date  . Cancer ([place])    SMALL PLACE-  MELANOMA REMOVED FROM BACK   . Depression   . Hypercholesterolemia   . Hypertension    Episode of severe anxiety/amnesia 10/08.  Past Surgical History:  Procedure Laterality Date  . ABDOMINAL HYSTERECTOMY     VAGINAL HYSTERECTOMY WITH OVARIAN PRESERVATION   . ABDOMINOPLASTY  1998  . APPENDECTOMY    . BREAST SURGERY  1985   BREAST REDUCTION   . TONSILLECTOMY AND ADENOIDECTOMY      Family History  Problem Relation Age of Onset  . Hypertension Mother   . Heart disease Father   .  Diabetes Father   . Cancer Sister 96       OVARIAN   . Thyroid disease Sister   . Heart disease Brother   . Thyroid disease Daughter   . Diabetes Paternal Grandmother      Social History:  reports that she has never smoked. She has never used smokeless tobacco. She reports that she does not drink alcohol. Her drug history is not on file.  Review of Systems -   HISTORY of osteopenia: bone density from gynecologist is normal in 12/16  History of anemia.  Hb 11.1 in 1/14 associated with iron deficiency, no recurrence.  Hemoccult negative in 4/16  Lab Results  Component Value Date   WBC 6.7 08/06/2016   HGB 13.0 08/06/2016   HCT 39.5 08/06/2016   MCV 86.3 08/06/2016   PLT 258.0 08/06/2016    She had problems with osteoarthritis of her knees      Vitamin D deficiency: The baseline level was 20. The last level Is back to normal  She has been taking 2000 units daily   History of Euthyroid goiter: Last TSH normal   Lab Results  Component Value Date   TSH 0.94 02/06/2016   TSH 0.53 02/10/2015   TSH 0.56 02/04/2014   FREET4 0.74 02/10/2015   FREET4 0.96 02/04/2014   FREET4 0.77 10/03/2012     EXAM:  BP 134/82   Pulse 69   Ht 5\' 2"  (1.575 m)   Wt 155 lb (70.3 kg)   SpO2 98%   BMI 28.35 kg/m      Assessment/Plan:    1.   HYPERLIPIDEMIA: Has had Good control of Lipids overall  with combination of 40 mg CRESTOR and Zetia Triglycerides are normal  2.  History of osteopenia: She will have bone density done by gynecologist in December  3.  Impaired fasting glucose; highest 108 previously and now borderline at 101 She will try to be consistent with diet.  Also advised her to restart  water aerobics  4.  History of hypertension: Blood pressure is still quite normal without medications  5.  Vitamin D deficiency: She has adequate level and continue OTC supplement  Wellness visit on the next follow-up      Barbara Maxwell 08/11/2016, 9:00 AM

## 2016-09-07 DIAGNOSIS — S20362A Insect bite (nonvenomous) of left front wall of thorax, initial encounter: Secondary | ICD-10-CM | POA: Diagnosis not present

## 2016-09-07 DIAGNOSIS — Z85820 Personal history of malignant melanoma of skin: Secondary | ICD-10-CM | POA: Diagnosis not present

## 2016-09-07 DIAGNOSIS — L308 Other specified dermatitis: Secondary | ICD-10-CM | POA: Diagnosis not present

## 2016-09-15 DIAGNOSIS — Z1231 Encounter for screening mammogram for malignant neoplasm of breast: Secondary | ICD-10-CM | POA: Diagnosis not present

## 2016-11-02 ENCOUNTER — Telehealth: Payer: Self-pay | Admitting: *Deleted

## 2016-11-02 MED ORDER — ESTRADIOL 10 MCG VA TABS: ORAL_TABLET | VAGINAL | 0 refills | Status: DC

## 2016-11-02 NOTE — Telephone Encounter (Signed)
Pt called requesting refill on yuvafem annual due in Nov. 2018 Rx sent for 90 day supply

## 2016-11-30 ENCOUNTER — Ambulatory Visit (INDEPENDENT_AMBULATORY_CARE_PROVIDER_SITE_OTHER): Payer: Medicare Other

## 2016-11-30 DIAGNOSIS — Z23 Encounter for immunization: Secondary | ICD-10-CM | POA: Diagnosis not present

## 2016-12-07 DIAGNOSIS — Z85820 Personal history of malignant melanoma of skin: Secondary | ICD-10-CM | POA: Diagnosis not present

## 2016-12-07 DIAGNOSIS — D2261 Melanocytic nevi of right upper limb, including shoulder: Secondary | ICD-10-CM | POA: Diagnosis not present

## 2016-12-07 DIAGNOSIS — D225 Melanocytic nevi of trunk: Secondary | ICD-10-CM | POA: Diagnosis not present

## 2016-12-07 DIAGNOSIS — D2262 Melanocytic nevi of left upper limb, including shoulder: Secondary | ICD-10-CM | POA: Diagnosis not present

## 2016-12-07 DIAGNOSIS — L821 Other seborrheic keratosis: Secondary | ICD-10-CM | POA: Diagnosis not present

## 2016-12-07 DIAGNOSIS — L814 Other melanin hyperpigmentation: Secondary | ICD-10-CM | POA: Diagnosis not present

## 2016-12-07 DIAGNOSIS — D1801 Hemangioma of skin and subcutaneous tissue: Secondary | ICD-10-CM | POA: Diagnosis not present

## 2017-01-26 ENCOUNTER — Encounter: Payer: Self-pay | Admitting: Obstetrics & Gynecology

## 2017-01-26 ENCOUNTER — Ambulatory Visit (INDEPENDENT_AMBULATORY_CARE_PROVIDER_SITE_OTHER): Payer: Medicare Other | Admitting: Obstetrics & Gynecology

## 2017-01-26 VITALS — BP 140/82 | Ht 62.0 in | Wt 153.0 lb

## 2017-01-26 DIAGNOSIS — R300 Dysuria: Secondary | ICD-10-CM | POA: Diagnosis not present

## 2017-01-26 DIAGNOSIS — Z01411 Encounter for gynecological examination (general) (routine) with abnormal findings: Secondary | ICD-10-CM | POA: Diagnosis not present

## 2017-01-26 DIAGNOSIS — Z780 Asymptomatic menopausal state: Secondary | ICD-10-CM | POA: Diagnosis not present

## 2017-01-26 DIAGNOSIS — Z90710 Acquired absence of both cervix and uterus: Secondary | ICD-10-CM

## 2017-01-26 MED ORDER — SULFAMETHOXAZOLE-TRIMETHOPRIM 800-160 MG PO TABS: 1.0000 | ORAL_TABLET | Freq: Two times a day (BID) | ORAL | 0 refills | Status: AC

## 2017-01-26 MED ORDER — FLUCONAZOLE 150 MG PO TABS: 150.0000 mg | ORAL_TABLET | Freq: Every day | ORAL | 1 refills | Status: AC

## 2017-01-26 NOTE — Progress Notes (Signed)
Barbara Maxwell 11/08/1935 [id number]   History:    81 y.o. G4P4L4 Married.    RP:  Established patient presenting for annual gyn exam   HPI:  S/P Vaginal Hysterectomy.  Menopause on no hormone replacement therapy.  No pelvic pain.  Breasts normal.  Pain when passing urine and frequency.  Some vaginal itching.  Bowel movements normal.  Health labs with family doctor.  Sister with ovarian cancer in her 81s.  Patient has been followed previously with pelvic ultrasound and Ca1 25 which were always normal and reassuring.  Patient has decided at this point not to continue with that annual screening.  Past medical history,surgical history, family history and social history were all reviewed and documented in the EPIC chart.  Gynecologic History No LMP recorded. Patient is postmenopausal. Contraception: status post hysterectomy Last Pap: 2011. Results were: normal Last mammogram: 08/2016. Results were: normal Dexa 01/2015 Normal, will repeat at 3 yrs [place] 2010  Obstetric History OB History  Gravida Para Term Preterm AB Living  4 4       4   SAB TAB Ectopic Multiple Live Births               # Outcome Date GA Lbr Len/2nd Weight Sex Delivery Anes PTL Lv  4 Para           3 Para           2 Para           1 Para                ROS: A ROS was performed and pertinent positives and negatives are included in the history.  GENERAL: No fevers or chills. HEENT: No change in vision, no earache, sore throat or sinus congestion. NECK: No pain or stiffness. CARDIOVASCULAR: No chest pain or pressure. No palpitations. PULMONARY: No shortness of breath, cough or wheeze. GASTROINTESTINAL: No abdominal pain, nausea, vomiting or diarrhea, melena or bright red blood per rectum. GENITOURINARY: No urinary frequency, urgency, hesitancy or dysuria. MUSCULOSKELETAL: No joint or muscle pain, no back pain, no recent trauma. DERMATOLOGIC: No rash, no itching, no lesions. ENDOCRINE: No polyuria, polydipsia, no heat  or cold intolerance. No recent change in weight. HEMATOLOGICAL: No anemia or easy bruising or bleeding. NEUROLOGIC: No headache, seizures, numbness, tingling or weakness. PSYCHIATRIC: No depression, no loss of interest in normal activity or change in sleep pattern.     Exam:   BP 140/82 (BP Location: Right Arm, Patient Position: Sitting, Cuff Size: Normal)   Ht 5\' 2"  (1.575 m)   Wt 153 lb (69.4 kg)   BMI 27.98 kg/m   Body mass index is 27.98 kg/m.  General appearance : Well developed well nourished female. No acute distress HEENT: Eyes: no retinal hemorrhage or exudates,  Neck supple, trachea midline, no carotid bruits, no thyroidmegaly Lungs: Clear to auscultation, no rhonchi or wheezes, or rib retractions  Heart: Regular rate and rhythm, no murmurs or gallops Breast:Examined in sitting and supine position were symmetrical in appearance s/p Bilateral breast reduction, no palpable masses or tenderness,  no skin retraction, no nipple inversion, no nipple discharge, no skin discoloration, no axillary or supraclavicular lymphadenopathy Abdomen: no palpable masses or tenderness, no rebound or guarding Extremities: no edema or skin discoloration or tenderness  Pelvic: Vulva normal  Bartholin, Urethra, Skene Glands: Within normal limits             Vagina: No gross lesions or discharge  Cervix/uterus absent  Adnexa  Without masses or tenderness  Anus and perineum  normal   Rectovaginal  normal sphincter tone without palpated masses or tenderness     U/A WBC 0-5, RBC 40-60, Bacteria Many, Yeast Many, Glu 3+, Nitrite Positive.  Pending Urine Culture   Assessment/Plan:  81 y.o. female for annual exam   1. Encounter for gynecological examination with abnormal finding Gynecologic exam status post hysterectomy.  History of normal Pap test we will not do further Pap's.  Breast exam normal.  Last mammogram July 2018 was normal.  Colonoscopy 2010.  Health labs with family physician.  2.  History of total hysterectomy   3. Menopause present No HRT.  Decision to wean and stop Vagifem at this point as patient is no longer sexually active.  4. Dysuria Cystitis and Yeasts pos.  Will treat with Bactrim twice a day x 3 days and follow with Fluconazole 150 mg daily x 3.  Treatment sent to pharmacy.  U/A WBC 0-5, RBC 40-60, Bacteria Many, Yeast Many, Glu 3+, Nitrite Positive.  Pending Urine Culture  Counseling on above issues >50% x 15 minutes  Princess Bruins MD, 3:00 PM 01/26/2017

## 2017-01-30 NOTE — Patient Instructions (Signed)
1. Encounter for gynecological examination with abnormal finding Gynecologic exam status post hysterectomy.  History of normal Pap test we will not do further Pap's.  Breast exam normal.  Last mammogram July 2018 was normal.  Colonoscopy 2010.  Health labs with family physician.  2. History of total hysterectomy   3. Menopause present No HRT.  Decision to wean and stop Vagifem at this point as patient is no longer sexually active.  4. Dysuria Cystitis and Yeasts pos.  Will treat with Bactrim twice a day x 3 days and follow with Fluconazole 150 mg daily x 3.  Treatment sent to pharmacy.  Barbara Maxwell, it was a pleasure meeting you today!  I will inform you of your results as soon as available.   Health Maintenance for Postmenopausal Women Menopause is a normal process in which your reproductive ability comes to an end. This process happens gradually over a span of months to years, usually between the ages of 29 and 41. Menopause is complete when you have missed 12 consecutive menstrual periods. It is important to talk with your health care provider about some of the most common conditions that affect postmenopausal women, such as heart disease, cancer, and bone loss (osteoporosis). Adopting a healthy lifestyle and getting preventive care can help to promote your health and wellness. Those actions can also lower your chances of developing some of these common conditions. What should I know about menopause? During menopause, you may experience a number of symptoms, such as:  Moderate-to-severe hot flashes.  Night sweats.  Decrease in sex drive.  Mood swings.  Headaches.  Tiredness.  Irritability.  Memory problems.  Insomnia.  Choosing to treat or not to treat menopausal changes is an individual decision that you make with your health care provider. What should I know about hormone replacement therapy and supplements? Hormone therapy products are effective for treating symptoms that are  associated with menopause, such as hot flashes and night sweats. Hormone replacement carries certain risks, especially as you become older. If you are thinking about using estrogen or estrogen with progestin treatments, discuss the benefits and risks with your health care provider. What should I know about heart disease and stroke? Heart disease, heart attack, and stroke become more likely as you age. This may be due, in part, to the hormonal changes that your body experiences during menopause. These can affect how your body processes dietary fats, triglycerides, and cholesterol. Heart attack and stroke are both medical emergencies. There are many things that you can do to help prevent heart disease and stroke:  Have your blood pressure checked at least every 1-2 years. High blood pressure causes heart disease and increases the risk of stroke.  If you are 26-20 years old, ask your health care provider if you should take aspirin to prevent a heart attack or a stroke.  Do not use any tobacco products, including cigarettes, chewing tobacco, or electronic cigarettes. If you need help quitting, ask your health care provider.  It is important to eat a healthy diet and maintain a healthy weight. ? Be sure to include plenty of vegetables, fruits, low-fat dairy products, and lean protein. ? Avoid eating foods that are high in solid fats, added sugars, or salt (sodium).  Get regular exercise. This is one of the most important things that you can do for your health. ? Try to exercise for at least 150 minutes each week. The type of exercise that you do should increase your heart rate and make you  sweat. This is known as moderate-intensity exercise. ? Try to do strengthening exercises at least twice each week. Do these in addition to the moderate-intensity exercise.  Know your numbers.Ask your health care provider to check your cholesterol and your blood glucose. Continue to have your blood tested as directed  by your health care provider.  What should I know about cancer screening? There are several types of cancer. Take the following steps to reduce your risk and to catch any cancer development as early as possible. Breast Cancer  Practice breast self-awareness. ? This means understanding how your breasts normally appear and feel. ? It also means doing regular breast self-exams. Let your health care provider know about any changes, no matter how small.  If you are 64 or older, have a clinician do a breast exam (clinical breast exam or CBE) every year. Depending on your age, family history, and medical history, it may be recommended that you also have a yearly breast X-ray (mammogram).  If you have a family history of breast cancer, talk with your health care provider about genetic screening.  If you are at high risk for breast cancer, talk with your health care provider about having an MRI and a mammogram every year.  Breast cancer (BRCA) gene test is recommended for women who have family members with BRCA-related cancers. Results of the assessment will determine the need for genetic counseling and BRCA1 and for BRCA2 testing. BRCA-related cancers include these types: ? Breast. This occurs in males or females. ? Ovarian. ? Tubal. This may also be called fallopian tube cancer. ? Cancer of the abdominal or pelvic lining (peritoneal cancer). ? Prostate. ? Pancreatic.  Cervical, Uterine, and Ovarian Cancer Your health care provider may recommend that you be screened regularly for cancer of the pelvic organs. These include your ovaries, uterus, and vagina. This screening involves a pelvic exam, which includes checking for microscopic changes to the surface of your cervix (Pap test).  For women ages 21-65, health care providers may recommend a pelvic exam and a Pap test every three years. For women ages 13-65, they may recommend the Pap test and pelvic exam, combined with testing for human papilloma  virus (HPV), every five years. Some types of HPV increase your risk of cervical cancer. Testing for HPV may also be done on women of any age who have unclear Pap test results.  Other health care providers may not recommend any screening for nonpregnant women who are considered low risk for pelvic cancer and have no symptoms. Ask your health care provider if a screening pelvic exam is right for you.  If you have had past treatment for cervical cancer or a condition that could lead to cancer, you need Pap tests and screening for cancer for at least 20 years after your treatment. If Pap tests have been discontinued for you, your risk factors (such as having a new sexual partner) need to be reassessed to determine if you should start having screenings again. Some women have medical problems that increase the chance of getting cervical cancer. In these cases, your health care provider may recommend that you have screening and Pap tests more often.  If you have a family history of uterine cancer or ovarian cancer, talk with your health care provider about genetic screening.  If you have vaginal bleeding after reaching menopause, tell your health care provider.  There are currently no reliable tests available to screen for ovarian cancer.  Lung Cancer Lung cancer screening is  recommended for adults 83-74 years old who are at high risk for lung cancer because of a history of smoking. A yearly low-dose CT scan of the lungs is recommended if you:  Currently smoke.  Have a history of at least 30 pack-years of smoking and you currently smoke or have quit within the past 15 years. A pack-year is smoking an average of one pack of cigarettes per day for one year.  Yearly screening should:  Continue until it has been 15 years since you quit.  Stop if you develop a health problem that would prevent you from having lung cancer treatment.  Colorectal Cancer  This type of cancer can be detected and can often  be prevented.  Routine colorectal cancer screening usually begins at age 53 and continues through age 74.  If you have risk factors for colon cancer, your health care provider may recommend that you be screened at an earlier age.  If you have a family history of colorectal cancer, talk with your health care provider about genetic screening.  Your health care provider may also recommend using home test kits to check for hidden blood in your stool.  A small camera at the end of a tube can be used to examine your colon directly (sigmoidoscopy or colonoscopy). This is done to check for the earliest forms of colorectal cancer.  Direct examination of the colon should be repeated every 5-10 years until age 23. However, if early forms of precancerous polyps or small growths are found or if you have a family history or genetic risk for colorectal cancer, you may need to be screened more often.  Skin Cancer  Check your skin from head to toe regularly.  Monitor any moles. Be sure to tell your health care provider: ? About any new moles or changes in moles, especially if there is a change in a mole's shape or color. ? If you have a mole that is larger than the size of a pencil eraser.  If any of your family members has a history of skin cancer, especially at a young age, talk with your health care provider about genetic screening.  Always use sunscreen. Apply sunscreen liberally and repeatedly throughout the day.  Whenever you are outside, protect yourself by wearing long sleeves, pants, a wide-brimmed hat, and sunglasses.  What should I know about osteoporosis? Osteoporosis is a condition in which bone destruction happens more quickly than new bone creation. After menopause, you may be at an increased risk for osteoporosis. To help prevent osteoporosis or the bone fractures that can happen because of osteoporosis, the following is recommended:  If you are 29-25 years old, get at least 1,000 mg of  calcium and at least 600 mg of vitamin D per day.  If you are older than age 24 but younger than age 46, get at least 1,200 mg of calcium and at least 600 mg of vitamin D per day.  If you are older than age 67, get at least 1,200 mg of calcium and at least 800 mg of vitamin D per day.  Smoking and excessive alcohol intake increase the risk of osteoporosis. Eat foods that are rich in calcium and vitamin D, and do weight-bearing exercises several times each week as directed by your health care provider. What should I know about how menopause affects my mental health? Depression may occur at any age, but it is more common as you become older. Common symptoms of depression include:  Low or sad  mood.  Changes in sleep patterns.  Changes in appetite or eating patterns.  Feeling an overall lack of motivation or enjoyment of activities that you previously enjoyed.  Frequent crying spells.  Talk with your health care provider if you think that you are experiencing depression. What should I know about immunizations? It is important that you get and maintain your immunizations. These include:  Tetanus, diphtheria, and pertussis (Tdap) booster vaccine.  Influenza every year before the flu season begins.  Pneumonia vaccine.  Shingles vaccine.  Your health care provider may also recommend other immunizations. This information is not intended to replace advice given to you by your health care provider. Make sure you discuss any questions you have with your health care provider. Document Released: 04/02/2005 Document Revised: 08/29/2015 Document Reviewed: 11/12/2014 Elsevier Interactive Patient Education  2018 [employer].  Urinary Tract Infection, Adult A urinary tract infection (UTI) is an infection of any part of the urinary tract, which includes the kidneys, ureters, bladder, and urethra. These organs make, store, and get rid of urine in the body. UTI can be a bladder infection (cystitis)  or kidney infection (pyelonephritis). What are the causes? This infection may be caused by fungi, viruses, or bacteria. Bacteria are the most common cause of UTIs. This condition can also be caused by repeated incomplete emptying of the bladder during urination. What increases the risk? This condition is more likely to develop if:  You ignore your need to urinate or hold urine for long periods of time.  You do not empty your bladder completely during urination.  You wipe back to front after urinating or having a bowel movement, if you are female.  You are uncircumcised, if you are female.  You are constipated.  You have a urinary catheter that stays in place (indwelling).  You have a weak defense (immune) system.  You have a medical condition that affects your bowels, kidneys, or bladder.  You have diabetes.  You take antibiotic medicines frequently or for long periods of time, and the antibiotics no longer work well against certain types of infections (antibiotic resistance).  You take medicines that irritate your urinary tract.  You are exposed to chemicals that irritate your urinary tract.  You are female.  What are the signs or symptoms? Symptoms of this condition include:  Fever.  Frequent urination or passing small amounts of urine frequently.  Needing to urinate urgently.  Pain or burning with urination.  Urine that smells bad or unusual.  Cloudy urine.  Pain in the lower abdomen or back.  Trouble urinating.  Blood in the urine.  Vomiting or being less hungry than normal.  Diarrhea or abdominal pain.  Vaginal discharge, if you are female.  How is this diagnosed? This condition is diagnosed with a medical history and physical exam. You will also need to provide a urine sample to test your urine. Other tests may be done, including:  Blood tests.  Sexually transmitted disease (STD) testing.  If you have had more than one UTI, a cystoscopy or imaging  studies may be done to determine the cause of the infections. How is this treated? Treatment for this condition often includes a combination of two or more of the following:  Antibiotic medicine.  Other medicines to treat less common causes of UTI.  Over-the-counter medicines to treat pain.  Drinking enough water to stay hydrated.  Follow these instructions at home:  Take over-the-counter and prescription medicines only as told by your health care provider.  If you were prescribed an antibiotic, take it as told by your health care provider. Do not stop taking the antibiotic even if you start to feel better.  Avoid alcohol, caffeine, tea, and carbonated beverages. They can irritate your bladder.  Drink enough fluid to keep your urine clear or pale yellow.  Keep all follow-up visits as told by your health care provider. This is important.  Make sure to: ? Empty your bladder often and completely. Do not hold urine for long periods of time. ? Empty your bladder before and after sex. ? Wipe from front to back after a bowel movement if you are female. Use each tissue one time when you wipe. Contact a health care provider if:  You have back pain.  You have a fever.  You feel nauseous or vomit.  Your symptoms do not get better after 3 days.  Your symptoms go away and then return. Get help right away if:  You have severe back pain or lower abdominal pain.  You are vomiting and cannot keep down any medicines or water. This information is not intended to replace advice given to you by your health care provider. Make sure you discuss any questions you have with your health care provider. Document Released: 11/18/2004 Document Revised: 07/23/2015 Document Reviewed: 12/30/2014 Elsevier Interactive Patient Education  2017 [employer].

## 2017-02-07 ENCOUNTER — Telehealth: Payer: Self-pay

## 2017-02-07 ENCOUNTER — Other Ambulatory Visit (INDEPENDENT_AMBULATORY_CARE_PROVIDER_SITE_OTHER): Payer: Medicare Other

## 2017-02-07 ENCOUNTER — Other Ambulatory Visit: Payer: Self-pay | Admitting: Endocrinology

## 2017-02-07 DIAGNOSIS — N3090 Cystitis, unspecified without hematuria: Secondary | ICD-10-CM

## 2017-02-07 DIAGNOSIS — E782 Mixed hyperlipidemia: Secondary | ICD-10-CM

## 2017-02-07 DIAGNOSIS — Z862 Personal history of diseases of the blood and blood-forming organs and certain disorders involving the immune mechanism: Secondary | ICD-10-CM

## 2017-02-07 DIAGNOSIS — R7301 Impaired fasting glucose: Secondary | ICD-10-CM

## 2017-02-07 DIAGNOSIS — E049 Nontoxic goiter, unspecified: Secondary | ICD-10-CM | POA: Diagnosis not present

## 2017-02-07 LAB — LIPID PANEL
CHOL/HDL RATIO: 3
Cholesterol: 137 mg/dL (ref 0–200)
HDL: 48.6 mg/dL (ref >39.00)
LDL CALC: 60 mg/dL (ref 0–99)
NONHDL: 88.79
TRIGLYCERIDES: 144 mg/dL (ref 0.0–149.0)
VLDL: 28.8 mg/dL (ref 0.0–40.0)

## 2017-02-07 LAB — COMPREHENSIVE METABOLIC PANEL
ALT: 15 U/L (ref 0–35)
AST: 22 U/L (ref 0–37)
Albumin: 4.4 g/dL (ref 3.5–5.2)
Alkaline Phosphatase: 58 U/L (ref 39–117)
BILIRUBIN TOTAL: 1.1 mg/dL (ref 0.2–1.2)
BUN: 15 mg/dL (ref 6–23)
CALCIUM: 9.6 mg/dL (ref 8.4–10.5)
CO2: 29 meq/L (ref 19–32)
Chloride: 106 mEq/L (ref 96–112)
Creatinine, Ser: 0.88 mg/dL (ref 0.40–1.20)
GFR: 65.78 mL/min (ref >60.00)
GLUCOSE: 96 mg/dL (ref 70–99)
POTASSIUM: 4.2 meq/L (ref 3.5–5.1)
Sodium: 142 mEq/L (ref 135–145)
Total Protein: 7.3 g/dL (ref 6.0–8.3)

## 2017-02-07 LAB — CBC
HEMATOCRIT: 42.8 % (ref 36.0–46.0)
Hemoglobin: 14 g/dL (ref 12.0–15.0)
MCHC: 32.6 g/dL (ref 30.0–36.0)
MCV: 87.3 fl (ref 78.0–100.0)
Platelets: 293 10*3/uL (ref 150.0–400.0)
RBC: 4.91 Mil/uL (ref 3.87–5.11)
RDW: 13.7 % (ref 11.5–15.5)
WBC: 6.2 10*3/uL (ref 4.0–10.5)

## 2017-02-07 LAB — TSH: TSH: 0.55 u[IU]/mL (ref 0.35–4.50)

## 2017-02-07 LAB — HEMOGLOBIN A1C: Hgb A1c MFr Bld: 6.3 % (ref 4.6–6.5)

## 2017-02-07 NOTE — Telephone Encounter (Signed)
Ordered

## 2017-02-07 NOTE — Telephone Encounter (Signed)
Please order

## 2017-02-07 NOTE — Telephone Encounter (Signed)
Patient notified order has been placed.

## 2017-02-07 NOTE — Telephone Encounter (Signed)
Please clarify the dx code to be used. Thanks!

## 2017-02-07 NOTE — Telephone Encounter (Signed)
Pateint came in for a lab appointment today and she has a CPE scheduled for 02/10/2017. She stated last week her obgyn put her on a antibiotic because she had a bladder infection. The patient is asking for a urinalysis to be completed during her CPE appointment so she can verify if the antibiotic has been effective.  Please advise if this can be ordered for the patient.

## 2017-02-09 NOTE — Progress Notes (Signed)
Patient ID: Barbara Maxwell, female   DOB: 1935-08-31, 81 y.o.   MRN: [id number]   Subjective:    Patient is being seen today for Medicare annual wellness visit      Risk factors: Advancing age, hyperlipidemia    Activities of Daily Living:  In the present state of health, the patient has no difficulty performing the following activities:  Preparing food and eating, Getting dressed and taking care of daily personal  needs Patient is able to ambulate without feeling unsteady In the past year the patient has not fallen or had a near fall    Safety: Has smoke detectors at home and wears seat belts. No excess sun exposure.  Diet and Exercise  Current exercise habits: Water aerobics 2/7 days a week, does not like to walk [place]  Dietary issues discussed: Follows a heart healthy diet   Vitamin supplements: She is taking vitamin D3,  2000 units  PREVENTIVE screening/vaccines:  Annual hemoccults:           4/16   Breast self exams:                             periodically   Colonoscopy/sigmoidoscopy  2006  Mammograms: 7/18  Yearly flu vaccine:                      yes   Bone Density:   2016   Calcium supplements: No  Tetanus booster:   2016   Zostavax:  2012  Pneumovax:  01/2014     Depression Screen:  Results as in the depression screening section  Advance directives:  She has living will but has not not brought a copy for record-keeping here  The following portions of the patient's history were reviewed and updated as appropriate: allergies, current medications, past family history, past medical history, past social history, past surgical history and problem list.   Preventive issues:  Lipid results, A1c revealed no significant abnormality   Allergies as of 02/10/2017      Reactions   Bee Venom Anaphylaxis   Simvastatin    Myopathy   Codeine Nausea Only   Evista [raloxifene]    Hot flashes   Percocet [oxycodone-acetaminophen] Nausea And  Vomiting   Nausea and vomiting      Medication List        Accurate as of 02/10/17 10:33 AM. Always use your most recent med list.          acetaminophen 325 MG tablet Commonly known as:  TYLENOL Take 650 mg by mouth every 6 (six) hours as needed for pain.   CALCIUM + D PO Take 1 tablet by mouth daily.   cholecalciferol 400 units Tabs tablet Commonly known as:  VITAMIN D Take 5,000 Units by mouth.   Estradiol 10 MCG Tabs vaginal tablet Commonly known as:  YUVAFEM INSERT 1 TABLET VAGINALLY TWICE WEEKLY   ezetimibe 10 MG tablet Commonly known as:  ZETIA TAKE 1 TABLET DAILY   meloxicam 15 MG tablet Commonly known as:  MOBIC Take 15 mg by mouth daily as needed for pain.   rosuvastatin 40 MG tablet Commonly known as:  CRESTOR TAKE 1 TABLET DAILY   sertraline 25 MG tablet Commonly known as:  ZOLOFT TAKE 1 TABLET DAILY   vitamin C 500 MG tablet Commonly known as:  ASCORBIC ACID Take 500 mg by mouth daily.  Allergies:  Allergies  Allergen Reactions  . Bee Venom Anaphylaxis  . Simvastatin     Myopathy  . Codeine Nausea Only  . Evista [Raloxifene]     Hot flashes  . Percocet [Oxycodone-Acetaminophen] Nausea And Vomiting    Nausea and vomiting    Past Medical History:  Diagnosis Date  . Cancer ([place])    [facility]-  MELANOMA REMOVED FROM BACK   . Depression   . Hypercholesterolemia   . Hypertension     Past Surgical History:  Procedure Laterality Date  . ABDOMINAL HYSTERECTOMY     VAGINAL HYSTERECTOMY WITH OVARIAN PRESERVATION   . ABDOMINOPLASTY  1998  . APPENDECTOMY    . BREAST SURGERY  1985   BREAST REDUCTION   . TONSILLECTOMY AND ADENOIDECTOMY      Family History  Problem Relation Age of Onset  . Hypertension Mother   . Heart disease Father   . Diabetes Father   . Cancer Sister 15       OVARIAN   . Thyroid disease Sister   . Heart disease Brother   . Thyroid disease Daughter   . Diabetes Paternal Grandmother     Social  History:  reports that  has never smoked. she has never used smokeless tobacco. She reports that she does not drink alcohol. Her drug history is not on file.   Review of Systems:   She has mild hearing loss but not requiring hearing aid,  no visual loss    Objective:    BP 130/82 (BP Location: Left Arm, Patient Position: Sitting, Cuff Size: Normal)   Pulse 89   Ht 5\' 2"  (1.575 m)   Wt 151 lb 9.6 oz (68.8 kg)   SpO2 98%   BMI 27.73 kg/m   Vision:  Normal, has annual eye exams   Hearing: Mildly impaired  Body mass index:  28 Neurological/balance: pt easily and quickly performs "get-up-and-go" from a sitting position Cognitive Impairment Assessment: cognition, memory and judgment appear normal.   Has excellent recall.  She is  literate.   She is alert and oriented x 3   Assessment:   Medicare wellness evaluation done   Preventive parameters reviewed    Plan:   During the course of the visit the patient was educated and counseled about appropriate screening and preventive services including:       Fall prevention   Screening mammography  Bone densitometry done by gynecologist every 2 years Diabetes screening shows normal glucose of 96, still has A1c 6.3, stable  Nutrition counseling done Exercise regimen reviewed and increased frequency recommended Lipid screening done, results accurate Colorectal screening and process, needs stool Hemoccult, she refuses to consider colonoscopy now   Regular eye and dental exams are being done Aspirin prophylaxis is not indicated  Vaccines / LABS Zostavax / Pneumococcal Vaccine/Prevnar/tetanus/influenza up-to-date   Patient Instructions (the written plan) was given to the patient.   Barbara Maxwell 02/10/17           OFFICE VISIT done separately for medical issues:    Barbara Maxwell        DEPRESSION: She is today complaining about feeling depressed at times with feeling low but not consistently.  She thinks this is related  to dealing with her husband's dementia and other issues.  She has been taking Zoloft 25 mg for several years because of previous episode of panic attacks and anxiety but this is not helping now.  She thinks she sleeps well usually. Recommendations: Discussed  need to increase her Zoloft for better benefit on her depression and she will start using 50 mg daily, to call if she is not improved   BLADDER infection: She is asking for a follow-up of her recent UTI.  She was seen by gynecologist and was complaining of some frequent and urgent urination and minimal discomfort.  She had an abnormal urinalysis although not with increased WBC and she thinks she is feeling better with taking Bactrim for 3 days and given by gynecologist   HYPERLIPIDEMIA: Recent labs on follow-up her LDL to be 60, HDL 48.7, stable   Lab Results  Component Value Date   CHOL 137 02/07/2017   HDL 48.60 02/07/2017   LDLCALC 60 02/07/2017   TRIG 144.0 02/07/2017   CHOLHDL 3 02/07/2017      IMPAIRED FASTING GLUCOSE: She has had intermittently high fasting glucose and A1c in the prediabetic range consistently also.  She has not lost any weight recently but this month has not been able to go for exercises regularly.  She thinks she is eating healthy diet.  Fasting glucose is now 96    Lab Results  Component Value Date   HGBA1C 6.3 02/07/2017   HGBA1C 6.2 08/06/2016   HGBA1C 6.1 08/11/2015   Lab Results  Component Value Date   LDLCALC 60 02/07/2017   CREATININE 0.88 02/07/2017     GOITER: Long-standing small amounts larger goiter  EXAM:   General appearance good: Not unusually anxious  Neck exam shows small soft to form multinodular goiter about twice normal in size  ASSESSMENT and plan as above  Urinalysis ordered   Barbara Maxwell 02/10/17

## 2017-02-10 ENCOUNTER — Ambulatory Visit (INDEPENDENT_AMBULATORY_CARE_PROVIDER_SITE_OTHER): Payer: Medicare Other | Admitting: Endocrinology

## 2017-02-10 ENCOUNTER — Other Ambulatory Visit: Payer: Medicare Other

## 2017-02-10 ENCOUNTER — Encounter: Payer: Self-pay | Admitting: Endocrinology

## 2017-02-10 VITALS — BP 130/82 | HR 89 | Ht 62.0 in | Wt 151.6 lb

## 2017-02-10 DIAGNOSIS — F320 Major depressive disorder, single episode, mild: Secondary | ICD-10-CM | POA: Diagnosis not present

## 2017-02-10 DIAGNOSIS — N3090 Cystitis, unspecified without hematuria: Secondary | ICD-10-CM | POA: Diagnosis not present

## 2017-02-10 DIAGNOSIS — F32A Depression, unspecified: Secondary | ICD-10-CM

## 2017-02-10 DIAGNOSIS — E782 Mixed hyperlipidemia: Secondary | ICD-10-CM | POA: Diagnosis not present

## 2017-02-10 LAB — URINALYSIS, ROUTINE W REFLEX MICROSCOPIC
BILIRUBIN URINE: NEGATIVE
Ketones, ur: NEGATIVE
LEUKOCYTES UA: NEGATIVE
Nitrite: NEGATIVE
PH: 6 (ref 5.0–8.0)
Specific Gravity, Urine: <=1.005 — AB (ref 1.000–1.030)
TOTAL PROTEIN, URINE-UPE24: NEGATIVE
URINE GLUCOSE: NEGATIVE
UROBILINOGEN UA: 0.2 (ref 0.0–1.0)

## 2017-02-10 NOTE — Patient Instructions (Addendum)
Bring copy of living will  Take 50mg  Sertraline daily  Periodic mammograms as discussed Check breasts for lumps monthly with the palm of the hand  Take  2000 units of Vitamin D3 daily and 500 mg of calcium daily  Low saturated fat diet, low intake of sugar and salt  Regular aerobic exercise 3-5 days a week  Regular dental and eye exams as recommended by the specialists  Make sure smoke detectors are functional at home Wear seat belts all the time

## 2017-02-21 ENCOUNTER — Other Ambulatory Visit (INDEPENDENT_AMBULATORY_CARE_PROVIDER_SITE_OTHER): Payer: Medicare Other

## 2017-02-21 DIAGNOSIS — Z1211 Encounter for screening for malignant neoplasm of colon: Secondary | ICD-10-CM

## 2017-02-23 LAB — FECAL OCCULT BLOOD, IMMUNOCHEMICAL: FECAL OCCULT BLD: NEGATIVE

## 2017-05-05 ENCOUNTER — Other Ambulatory Visit: Payer: Self-pay | Admitting: Endocrinology

## 2017-05-17 ENCOUNTER — Other Ambulatory Visit: Payer: Self-pay | Admitting: Endocrinology

## 2017-06-22 DIAGNOSIS — H02831 Dermatochalasis of right upper eyelid: Secondary | ICD-10-CM | POA: Diagnosis not present

## 2017-06-22 DIAGNOSIS — H02834 Dermatochalasis of left upper eyelid: Secondary | ICD-10-CM | POA: Diagnosis not present

## 2017-06-22 DIAGNOSIS — H5213 Myopia, bilateral: Secondary | ICD-10-CM | POA: Diagnosis not present

## 2017-06-22 DIAGNOSIS — H04123 Dry eye syndrome of bilateral lacrimal glands: Secondary | ICD-10-CM | POA: Diagnosis not present

## 2017-07-31 ENCOUNTER — Other Ambulatory Visit: Payer: Self-pay | Admitting: Endocrinology

## 2017-07-31 DIAGNOSIS — E782 Mixed hyperlipidemia: Secondary | ICD-10-CM

## 2017-07-31 DIAGNOSIS — R7303 Prediabetes: Secondary | ICD-10-CM

## 2017-08-08 ENCOUNTER — Other Ambulatory Visit (INDEPENDENT_AMBULATORY_CARE_PROVIDER_SITE_OTHER): Payer: Medicare Other

## 2017-08-08 DIAGNOSIS — R7303 Prediabetes: Secondary | ICD-10-CM

## 2017-08-08 DIAGNOSIS — E782 Mixed hyperlipidemia: Secondary | ICD-10-CM | POA: Diagnosis not present

## 2017-08-08 LAB — LIPID PANEL
CHOL/HDL RATIO: 3
Cholesterol: 143 mg/dL (ref 0–200)
HDL: 50.7 mg/dL (ref >39.00)
LDL CALC: 71 mg/dL (ref 0–99)
NONHDL: 92.19
Triglycerides: 104 mg/dL (ref 0.0–149.0)
VLDL: 20.8 mg/dL (ref 0.0–40.0)

## 2017-08-08 LAB — COMPREHENSIVE METABOLIC PANEL WITH GFR
ALT: 17 U/L (ref 0–35)
AST: 22 U/L (ref 0–37)
Albumin: 4.2 g/dL (ref 3.5–5.2)
Alkaline Phosphatase: 57 U/L (ref 39–117)
BUN: 15 mg/dL (ref 6–23)
CO2: 26 meq/L (ref 19–32)
Calcium: 9.5 mg/dL (ref 8.4–10.5)
Chloride: 108 meq/L (ref 96–112)
Creatinine, Ser: 0.9 mg/dL (ref 0.40–1.20)
GFR: 64.01 mL/min (ref >60.00)
Glucose, Bld: 102 mg/dL — ABNORMAL HIGH (ref 70–99)
Potassium: 4 meq/L (ref 3.5–5.1)
Sodium: 143 meq/L (ref 135–145)
Total Bilirubin: 0.8 mg/dL (ref 0.2–1.2)
Total Protein: 6.9 g/dL (ref 6.0–8.3)

## 2017-08-08 LAB — HEMOGLOBIN A1C: HEMOGLOBIN A1C: 6.3 % (ref 4.6–6.5)

## 2017-08-11 ENCOUNTER — Ambulatory Visit: Payer: Medicare Other | Admitting: Endocrinology

## 2017-08-16 ENCOUNTER — Encounter: Payer: Self-pay | Admitting: Endocrinology

## 2017-08-16 ENCOUNTER — Ambulatory Visit (INDEPENDENT_AMBULATORY_CARE_PROVIDER_SITE_OTHER): Payer: Medicare Other | Admitting: Endocrinology

## 2017-08-16 VITALS — BP 124/70 | HR 71 | Ht 62.0 in | Wt 153.0 lb

## 2017-08-16 DIAGNOSIS — E782 Mixed hyperlipidemia: Secondary | ICD-10-CM | POA: Diagnosis not present

## 2017-08-16 DIAGNOSIS — R7301 Impaired fasting glucose: Secondary | ICD-10-CM | POA: Diagnosis not present

## 2017-08-16 DIAGNOSIS — E559 Vitamin D deficiency, unspecified: Secondary | ICD-10-CM | POA: Diagnosis not present

## 2017-08-16 DIAGNOSIS — E049 Nontoxic goiter, unspecified: Secondary | ICD-10-CM

## 2017-08-16 NOTE — Progress Notes (Signed)
Patient ID: Barbara Maxwell, female   DOB: Sep 22, 1935, 82 y.o.   MRN: [id number]    Chief complaint: Followup of various problems   History of Present Illness:   PROBLEM 1: PREDIABETES:   History of mild impaired fasting glucose, glucose is higher at 102, previously 96 Her weight is stable A1c has been upper normal and higher than expected for her fasting glucose  She is usually fairly good with her diet although she is eating regular ice cream frequently and uses whole milk in her cereal  She doing water aerobics twice a week and is also doing some yard work   [employer] from Last 3 Encounters:  08/16/17 153 lb (69.4 kg)  02/10/17 151 lb 9.6 oz (68.8 kg)  01/26/17 153 lb (69.4 kg)    Lab Results  Component Value Date   HGBA1C 6.3 08/08/2017   HGBA1C 6.3 02/07/2017   HGBA1C 6.2 08/06/2016   Lab Results  Component Value Date   LDLCALC 71 08/08/2017   CREATININE 0.90 08/08/2017     PROBLEM 2:    High lipids since 1980s. Highest Direct LDL 209.  Initially given Lipitor in 2001. Was well controlled with Lipitor 20 mg and no side effects. Had myopathy with Zocor.   However LDL had increased to 154 and was changed to Crestor in 6/04.  This was increased to 40 mg because of LDL particle number of [id number].  Zetia added in 2/09 also and LDL particle number was [id number] in 12/13.   LDL particle number was 1115 last, previously better.  Triglycerides have been previously high, now consistently normal HDL is fairly good without niacin LDL somewhat higher although still around 70   Lipids are as follows:   Lab Results  Component Value Date   CHOL 143 08/08/2017   HDL 50.70 08/08/2017   LDLCALC 71 08/08/2017   TRIG 104.0 08/08/2017   CHOLHDL 3 08/08/2017       Allergies as of 08/16/2017      Reactions   Bee Venom Anaphylaxis   Simvastatin    Myopathy   Codeine Nausea Only   Evista [raloxifene]    Hot flashes   Percocet [oxycodone-acetaminophen] Nausea And  Vomiting   Nausea and vomiting      Medication List        Accurate as of 08/16/17  3:07 PM. Always use your most recent med list.          acetaminophen 325 MG tablet Commonly known as:  TYLENOL Take 650 mg by mouth every 6 (six) hours as needed for pain.   CALCIUM + D PO Take 1 tablet by mouth daily.   cholecalciferol 400 units Tabs tablet Commonly known as:  VITAMIN D Take 5,000 Units by mouth.   CRESTOR 40 MG tablet Generic drug:  rosuvastatin TAKE 1 TABLET DAILY   ezetimibe 10 MG tablet Commonly known as:  ZETIA TAKE 1 TABLET DAILY   sertraline 25 MG tablet Commonly known as:  ZOLOFT TAKE 1 TABLET DAILY   vitamin C 500 MG tablet Commonly known as:  ASCORBIC ACID Take 500 mg by mouth daily.       Allergies:  Allergies  Allergen Reactions  . Bee Venom Anaphylaxis  . Simvastatin     Myopathy  . Codeine Nausea Only  . Evista [Raloxifene]     Hot flashes  . Percocet [Oxycodone-Acetaminophen] Nausea And Vomiting    Nausea and vomiting    Past Medical History:  Diagnosis Date  .  Cancer ([place])    [facility]-  MELANOMA REMOVED FROM BACK   . Depression   . Hypercholesterolemia   . Hypertension    Episode of severe anxiety/amnesia 10/08.  Past Surgical History:  Procedure Laterality Date  . ABDOMINAL HYSTERECTOMY     VAGINAL HYSTERECTOMY WITH OVARIAN PRESERVATION   . ABDOMINOPLASTY  1998  . APPENDECTOMY    . BREAST SURGERY  1985   BREAST REDUCTION   . TONSILLECTOMY AND ADENOIDECTOMY      Family History  Problem Relation Age of Onset  . Hypertension Mother   . Heart disease Father   . Diabetes Father   . Cancer Sister 85       OVARIAN   . Thyroid disease Sister   . Heart disease Brother   . Thyroid disease Daughter   . Diabetes Paternal Grandmother      Social History:  reports that she has never smoked. She has never used smokeless tobacco. She reports that she does not drink alcohol. Her drug history is not on file.  Review of  Systems -   HISTORY of osteopenia: bone density from gynecologist is normal in 12/16  History of anemia.  Hb 11.1 in 1/14 associated with iron deficiency, no recurrence.  Hemoccult negative in 12/18  Lab Results  Component Value Date   WBC 6.2 02/07/2017   HGB 14.0 02/07/2017   HCT 42.8 02/07/2017   MCV 87.3 02/07/2017   PLT 293.0 02/07/2017      Vitamin D deficiency: The baseline level was 20. The last level Is back to normal  She has been taking 2000 units daily   History of Euthyroid goiter: Last TSH normal   Lab Results  Component Value Date   TSH 0.55 02/07/2017   TSH 0.94 02/06/2016   TSH 0.53 02/10/2015   FREET4 0.74 02/10/2015   FREET4 0.96 02/04/2014   FREET4 0.77 10/03/2012     EXAM:  BP 124/70 (BP Location: Left Arm, Patient Position: Sitting, Cuff Size: Normal)   Pulse 71   Ht 5\' 2"  (1.575 m)   Wt 153 lb (69.4 kg)   SpO2 98%   BMI 27.98 kg/m      Assessment/Plan:    1.   HYPERLIPIDEMIA: Has had fairly good control of Lipids overall  with combination of 40 mg CRESTOR and Zetia Triglycerides are normal She tends to have higher LDL particle number even though her LDL is around 70 Given her a list of high saturated fat foods  2.  History of osteopenia: Followed by gynecologist  3.  Impaired fasting glucose; highest 108 previously and now borderline at 102 She does need to watch her diet with cutting back on high fat dairy products and ice cream   4.  History of hypertension: Blood pressure is still quite normal without medications  5.  Vitamin D deficiency: She needs periodic levels  Wellness visit on the next follow-up      Elayne Snare 08/16/2017, 3:07 PM

## 2017-09-21 ENCOUNTER — Encounter: Payer: Self-pay | Admitting: Obstetrics & Gynecology

## 2017-09-21 DIAGNOSIS — Z1231 Encounter for screening mammogram for malignant neoplasm of breast: Secondary | ICD-10-CM | POA: Diagnosis not present

## 2017-11-01 DIAGNOSIS — R040 Epistaxis: Secondary | ICD-10-CM | POA: Diagnosis not present

## 2017-11-04 DIAGNOSIS — R040 Epistaxis: Secondary | ICD-10-CM | POA: Diagnosis not present

## 2017-11-17 ENCOUNTER — Ambulatory Visit (INDEPENDENT_AMBULATORY_CARE_PROVIDER_SITE_OTHER): Payer: Medicare Other | Admitting: *Deleted

## 2017-11-17 DIAGNOSIS — Z23 Encounter for immunization: Secondary | ICD-10-CM

## 2017-11-17 NOTE — Progress Notes (Signed)
ADMINISTERED HIGH DOSE FLU SHOT TODAY.

## 2017-12-14 DIAGNOSIS — L570 Actinic keratosis: Secondary | ICD-10-CM | POA: Diagnosis not present

## 2017-12-14 DIAGNOSIS — D225 Melanocytic nevi of trunk: Secondary | ICD-10-CM | POA: Diagnosis not present

## 2017-12-14 DIAGNOSIS — D2262 Melanocytic nevi of left upper limb, including shoulder: Secondary | ICD-10-CM | POA: Diagnosis not present

## 2017-12-14 DIAGNOSIS — D2261 Melanocytic nevi of right upper limb, including shoulder: Secondary | ICD-10-CM | POA: Diagnosis not present

## 2017-12-14 DIAGNOSIS — L821 Other seborrheic keratosis: Secondary | ICD-10-CM | POA: Diagnosis not present

## 2017-12-14 DIAGNOSIS — D1801 Hemangioma of skin and subcutaneous tissue: Secondary | ICD-10-CM | POA: Diagnosis not present

## 2017-12-14 DIAGNOSIS — Z85820 Personal history of malignant melanoma of skin: Secondary | ICD-10-CM | POA: Diagnosis not present

## 2018-01-30 ENCOUNTER — Other Ambulatory Visit: Payer: Self-pay | Admitting: Endocrinology

## 2018-02-06 ENCOUNTER — Other Ambulatory Visit (INDEPENDENT_AMBULATORY_CARE_PROVIDER_SITE_OTHER): Payer: Medicare Other

## 2018-02-06 DIAGNOSIS — E049 Nontoxic goiter, unspecified: Secondary | ICD-10-CM | POA: Diagnosis not present

## 2018-02-06 DIAGNOSIS — E782 Mixed hyperlipidemia: Secondary | ICD-10-CM

## 2018-02-06 DIAGNOSIS — E559 Vitamin D deficiency, unspecified: Secondary | ICD-10-CM | POA: Diagnosis not present

## 2018-02-06 DIAGNOSIS — R7301 Impaired fasting glucose: Secondary | ICD-10-CM

## 2018-02-06 LAB — TSH: TSH: 0.53 u[IU]/mL (ref 0.35–4.50)

## 2018-02-06 LAB — COMPREHENSIVE METABOLIC PANEL
ALBUMIN: 4.4 g/dL (ref 3.5–5.2)
ALT: 16 U/L (ref 0–35)
AST: 22 U/L (ref 0–37)
Alkaline Phosphatase: 56 U/L (ref 39–117)
BUN: 14 mg/dL (ref 6–23)
CHLORIDE: 105 meq/L (ref 96–112)
CO2: 27 meq/L (ref 19–32)
CREATININE: 0.9 mg/dL (ref 0.40–1.20)
Calcium: 9.8 mg/dL (ref 8.4–10.5)
GFR: 63.93 mL/min (ref >60.00)
Glucose, Bld: 97 mg/dL (ref 70–99)
Potassium: 4.4 mEq/L (ref 3.5–5.1)
SODIUM: 141 meq/L (ref 135–145)
Total Bilirubin: 0.8 mg/dL (ref 0.2–1.2)
Total Protein: 7 g/dL (ref 6.0–8.3)

## 2018-02-06 LAB — LIPID PANEL
CHOL/HDL RATIO: 3
CHOLESTEROL: 152 mg/dL (ref 0–200)
HDL: 48.6 mg/dL (ref >39.00)
LDL Cholesterol: 79 mg/dL (ref 0–99)
NonHDL: 103.03
Triglycerides: 118 mg/dL (ref 0.0–149.0)
VLDL: 23.6 mg/dL (ref 0.0–40.0)

## 2018-02-06 LAB — HEMOGLOBIN A1C: Hgb A1c MFr Bld: 6.2 % (ref 4.6–6.5)

## 2018-02-06 LAB — VITAMIN D 25 HYDROXY (VIT D DEFICIENCY, FRACTURES): VITD: 49.86 ng/mL (ref 30.00–100.00)

## 2018-02-09 ENCOUNTER — Encounter: Payer: Self-pay | Admitting: Endocrinology

## 2018-02-09 ENCOUNTER — Ambulatory Visit (INDEPENDENT_AMBULATORY_CARE_PROVIDER_SITE_OTHER): Payer: Medicare Other | Admitting: Endocrinology

## 2018-02-09 VITALS — BP 136/82 | HR 82 | Ht 62.0 in | Wt 150.4 lb

## 2018-02-09 DIAGNOSIS — E559 Vitamin D deficiency, unspecified: Secondary | ICD-10-CM | POA: Diagnosis not present

## 2018-02-09 DIAGNOSIS — R7303 Prediabetes: Secondary | ICD-10-CM | POA: Diagnosis not present

## 2018-02-09 DIAGNOSIS — Z0000 Encounter for general adult medical examination without abnormal findings: Secondary | ICD-10-CM

## 2018-02-09 DIAGNOSIS — E782 Mixed hyperlipidemia: Secondary | ICD-10-CM | POA: Diagnosis not present

## 2018-02-09 DIAGNOSIS — F320 Major depressive disorder, single episode, mild: Secondary | ICD-10-CM

## 2018-02-09 DIAGNOSIS — R030 Elevated blood-pressure reading, without diagnosis of hypertension: Secondary | ICD-10-CM

## 2018-02-09 DIAGNOSIS — F32A Depression, unspecified: Secondary | ICD-10-CM

## 2018-02-09 NOTE — Patient Instructions (Addendum)
Shingles vaccine  atpharmacy  Need copy of living will  Low saturated food diet, 2% milk  Periodic mammograms   Check breasts for lumps monthly with the palm of the hand  Take  1000 units of Vitamin D3 daily and 500 mg of calcium daily  Low saturated fat diet, low intake of sugar and salt  Regular aerobic exercise 5 days a week  Regular dental and eye exams as recommended by the specialists  Make sure smoke detectors are functional at home Wear seat belts all the time

## 2018-02-09 NOTE — Progress Notes (Signed)
Patient ID: Barbara Maxwell, female   DOB: 1935/11/23, 82 y.o.   MRN: [id number]   Subjective:    Patient is being seen today for Medicare annual wellness visit and assessment of chronic problems.     Risk factors: Advancing age    51 of Physicians Providing Medical Care to Patient:  See "care team section"  Activities of Daily Living: Recently the patient has no difficulty performing the following activities:  Preparing food and eating, Getting dressed and taking care of daily personal  needs Patient is able to ambulate well without feeling unsteady In the past year the patient has not fallen or had a near fall    Safety: Has smoke detector and wears seat belts. No excess sun exposure.  Diet and Exercise  Current exercise habits: Water aerobics 2/7 days a week She does not exercise indoors such as on a treadmill   Dietary issues discussed: Follows a heart healthy diet usually, however using whole milk  Vitamin supplements: She is taking vitamin D3, at least 2000 units, with some in her calcium  PREVENTIVE screening/vaccines:  Annual hemoccults:           12/18  Breast self exams:                             periodically   Colonoscopy/sigmoidoscopy  2006  Mammograms:  7/19  Yearly flu vaccine:                      yes   Bone Density:   2016  Calcium supplements:  Yes Tetanus booster:   1998   Zostavax:  2012  Pneumovax:  01/2014     Depression Screen:  Results available in the depression screening section  Advance directives:  She has not brought a copy of her living will but has this already made  The following portions of the patient's history were reviewed and updated as appropriate: allergies, current medications, past family history, past medical history, past social history, past surgical history and problem list.     Allergies as of 02/09/2018      Reactions   Bee Venom Anaphylaxis   Simvastatin    Myopathy   Codeine Nausea Only   Evista [raloxifene]    Hot flashes   Percocet [oxycodone-acetaminophen] Nausea And Vomiting   Nausea and vomiting      Medication List       Accurate as of February 09, 2018  2:56 PM. Always use your most recent med list.        acetaminophen 325 MG tablet Commonly known as:  TYLENOL Take 650 mg by mouth every 6 (six) hours as needed for pain.   CALCIUM + D PO Take 1 tablet by mouth daily.   cholecalciferol 10 MCG (400 UNIT) Tabs tablet Commonly known as:  VITAMIN D3 Take 5,000 Units by mouth.   CRESTOR 40 MG tablet Generic drug:  rosuvastatin TAKE 1 TABLET DAILY   ezetimibe 10 MG tablet Commonly known as:  ZETIA TAKE 1 TABLET DAILY   sertraline 25 MG tablet Commonly known as:  ZOLOFT TAKE 1 TABLET DAILY   vitamin C 500 MG tablet Commonly known as:  ASCORBIC ACID Take 500 mg by mouth daily.       Allergies:  Allergies  Allergen Reactions  . Bee Venom Anaphylaxis  . Simvastatin     Myopathy  . Codeine Nausea Only  . Evista [Raloxifene]  Hot flashes  . Percocet [Oxycodone-Acetaminophen] Nausea And Vomiting    Nausea and vomiting    Past Medical History:  Diagnosis Date  . Cancer ([place])    SMALL PLACE-  MELANOMA REMOVED FROM BACK   . Depression   . Hypercholesterolemia   . Hypertension     Past Surgical History:  Procedure Laterality Date  . ABDOMINAL HYSTERECTOMY     VAGINAL HYSTERECTOMY WITH OVARIAN PRESERVATION   . ABDOMINOPLASTY  1998  . APPENDECTOMY    . BREAST SURGERY  1985   BREAST REDUCTION   . TONSILLECTOMY AND ADENOIDECTOMY      Family History  Problem Relation Age of Onset  . Hypertension Mother   . Heart disease Father   . Diabetes Father   . Cancer Sister 77       OVARIAN   . Thyroid disease Sister   . Heart disease Brother   . Thyroid disease Daughter   . Diabetes Paternal Grandmother     Social History:  reports that she has never smoked. She has never used smokeless tobacco. She reports that she does not drink alcohol. No history on file  for drug.   Review of Systems:   She has mild hearing loss but not worsening, and no visual loss  Does not have any changes in appetite or significant weight change  Wt Readings from Last 3 Encounters:  02/09/18 150 lb 6.4 oz (68.2 kg)  08/16/17 153 lb (69.4 kg)  02/10/17 151 lb 9.6 oz (68.8 kg)     Objective:    BP 136/82 (BP Location: Left Arm, Patient Position: Sitting, Cuff Size: Normal)   Pulse 82   Ht 5' 2"  (1.575 m)   Wt 150 lb 6.4 oz (68.2 kg)   SpO2 97%   BMI 27.51 kg/m   Vision:  Normal, has annual eye exams without abnormal findings   Hearing: Mildly impaired  Body mass index:  See vitals Neurological/balance: pt easily and quickly performs "get-up-and-go" from a sitting position  Cognitive Impairment Assessment: cognition and judgment appear normal.   Has excellent recall.   Assessment:   Medicare wellness evaluation done   Preventive parameters reviewed    Plan:   During the course of the visit the patient was educated and counseled about appropriate screening and preventive services including:       Fall prevention discussed   Screening mammography next July Bone densitometry done by gynecologist and to be scheduled on her next visit Diabetes screening shows normal glucose as discussed below Nutrition counseling done Exercise regimen reviewed and increased walking suggested Lipid screening as discussed below Colorectal screening as discussed below, currently up-to-date  Regular eye and dental exams recommended Aspirin prophylaxis is not indicated She will bring copy of her living will for records  Vaccines / LABS Zostavax / Pneumococcal Vaccine/Prevnar up-to-date She will need Shingrix and discussed that she will need to get this from her pharmacy and discussed benefits especially with advancing age and previous history of shingles Stool Hemoccult kit was given today  Patient Instructions (the written plan) was given to the patient.    Patient Instructions  Shingles vaccine  atpharmacy  Need copy of living will  Low saturated food diet, 2% milk  Periodic mammograms   Check breasts for lumps monthly with the palm of the hand  Take  1000 units of Vitamin D3 daily and 500 mg of calcium daily  Low saturated fat diet, low intake of sugar and salt  Regular aerobic exercise  5 days a week  Regular dental and eye exams as recommended by the specialists  Make sure smoke detectors are functional at home Wear seat belts all the time      Elayne Snare 02/09/18          OFFICE VISIT for medical issues:    HPI    1.  Increased blood pressure: Her blood pressure is higher than usual in the office, usually 096 systolic and also not significantly high when she checks it occasionally at home but Repeat blood pressure by myself was 160/85 Previously treated with Benicar for hypertension a few years ago  BP Readings from Last 3 Encounters:  02/09/18 136/82  08/16/17 124/70  02/10/17 130/82     2.  ANXIETY and depression: She does feel stressed or upset at times when mostly because of her husband's mild dementia.  Does not think she has had any panic attacks recently and her moods are generally fairly good.  She does not think she needs to increase her Zoloft which is also helping her sleep  3.  LIPIDS:Has had fairly good control of Lipids overall  with combination of 40 mg CRESTOR and Zetia Triglycerides are normal She tends to have higher LDL particle number even though her LDL is around 70  Lab Results  Component Value Date   CHOL 152 02/06/2018   CHOL 143 08/08/2017   CHOL 137 02/07/2017   Lab Results  Component Value Date   HDL 48.60 02/06/2018   HDL 50.70 08/08/2017   HDL 48.60 02/07/2017   Lab Results  Component Value Date   LDLCALC 79 02/06/2018   LDLCALC 71 08/08/2017   LDLCALC 60 02/07/2017   Lab Results  Component Value Date   TRIG 118.0 02/06/2018   TRIG 104.0 08/08/2017   TRIG  144.0 02/07/2017   Lab Results  Component Value Date   CHOLHDL 3 02/06/2018   CHOLHDL 3 08/08/2017   CHOLHDL 3 02/07/2017   No results found for: LDLDIRECT    4.  History of osteopenia: 2014 her T score was -1.1 but subsequently in 2016 had had improved to -0.9 at the left femur in 01/2015.  This is done by gynecologist and no follow-up scheduled as yet  VITAMIN D deficiency: She is taking a total of about 2000 units supplement  Lab Results  Component Value Date   VD25OH 49.86 02/06/2018   VD25OH 42.80 08/06/2016   VD25OH 39 01/21/2015    5.  Impaired fasting glucose: This is now back to normal with glucose 97 fasting This is despite her A1c being still in the prediabetic range long-term   Lab Results  Component Value Date   HGBA1C 6.2 02/06/2018   HGBA1C 6.3 08/08/2017   HGBA1C 6.3 02/07/2017   Lab Results  Component Value Date   LDLCALC 79 02/06/2018   CREATININE 0.90 02/06/2018       Physical Exam Neck:     Vascular: No carotid bruit.  Cardiovascular:     Rate and Rhythm: Normal rate and regular rhythm.     Heart sounds: Normal heart sounds. No murmur.    BP 136/82 (BP Location: Left Arm, Patient Position: Sitting, Cuff Size: Normal)   Pulse 82   Ht 5' 2"  (1.575 m)   Wt 150 lb 6.4 oz (68.2 kg)   SpO2 97%   BMI 27.51 kg/m    Assessment/Plan:   1.  LIPIDS:  She was advised to cut back on high saturated fat foods especially dairy  products including whole milk and recheck with lipoprotein panel on the next visit  2.  Increased blood pressure today: Likely to be from whitecoat syndrome and she will continue to monitor at home regularly Encouraged her to start regular exercise with walking in addition to her water aerobics   3.  Abnormal A1c without high glucose: We will continue to follow  4.  Anxiety history: This is controlled, she has only mild situational depression but does not want to increase her Zoloft at this time   Follow-up in 6  months   Elayne Snare 02/09/18

## 2018-02-13 ENCOUNTER — Other Ambulatory Visit (INDEPENDENT_AMBULATORY_CARE_PROVIDER_SITE_OTHER): Payer: Medicare Other

## 2018-02-13 DIAGNOSIS — Z1211 Encounter for screening for malignant neoplasm of colon: Secondary | ICD-10-CM | POA: Diagnosis not present

## 2018-02-14 ENCOUNTER — Telehealth: Payer: Self-pay

## 2018-02-14 ENCOUNTER — Other Ambulatory Visit: Payer: Medicare Other

## 2018-02-14 DIAGNOSIS — Z0000 Encounter for general adult medical examination without abnormal findings: Secondary | ICD-10-CM

## 2018-02-14 LAB — FECAL OCCULT BLOOD, IMMUNOCHEMICAL: Fecal Occult Bld: NEGATIVE

## 2018-02-14 NOTE — Telephone Encounter (Signed)
error 

## 2018-02-16 NOTE — Progress Notes (Signed)
Please call to let patient know that the Hemoccult results are normal and no further action needed

## 2018-03-13 ENCOUNTER — Encounter: Payer: Medicare Other | Admitting: Obstetrics & Gynecology

## 2018-05-03 ENCOUNTER — Encounter: Payer: Medicare Other | Admitting: Obstetrics & Gynecology

## 2018-05-04 ENCOUNTER — Encounter: Payer: Self-pay | Admitting: Obstetrics & Gynecology

## 2018-05-04 ENCOUNTER — Ambulatory Visit (INDEPENDENT_AMBULATORY_CARE_PROVIDER_SITE_OTHER): Payer: TRICARE For Life (TFL) | Admitting: Obstetrics & Gynecology

## 2018-05-04 ENCOUNTER — Other Ambulatory Visit: Payer: Self-pay

## 2018-05-04 VITALS — BP 130/80 | Ht 62.0 in | Wt 152.0 lb

## 2018-05-04 DIAGNOSIS — Z01419 Encounter for gynecological examination (general) (routine) without abnormal findings: Secondary | ICD-10-CM | POA: Diagnosis not present

## 2018-05-04 DIAGNOSIS — Z780 Asymptomatic menopausal state: Secondary | ICD-10-CM

## 2018-05-04 DIAGNOSIS — R3915 Urgency of urination: Secondary | ICD-10-CM | POA: Diagnosis not present

## 2018-05-04 NOTE — Progress Notes (Signed)
Barbara Maxwell [birth year] [id number]   History:    83 y.o. G4P4L4 Married  RP:  Established patient presenting for annual gyn exam   HPI: S/P Vaginal Hysterectomy.  Menopause, well on no hormone replacement therapy.  No pelvic pain.  Abstinent.  Urine with mild urgency and bowel movements normal.  Minimally symptomatic from a small cystocele.  Breasts normal.  Body mass index 27.8.  Physically active.  Health labs with family physician.  Sister with ovarian cancer at age 67.  Patient has had normal pelvic ultrasound and Ca125 in the past, decision to stop repeating those tests last year.  Past medical history,surgical history, family history and social history were all reviewed and documented in the EPIC chart.  Gynecologic History No LMP recorded. Patient is postmenopausal. Contraception: status post hysterectomy Last Pap: 2011. Results were: normal Last mammogram: 08/2017. Results were: Negative Bone Density: 01/2015 Normal Colonoscopy: Cologard 2019  Obstetric History OB History  Gravida Para Term Preterm AB Living  4 4       4   SAB TAB Ectopic Multiple Live Births               # Outcome Date GA Lbr Len/2nd Weight Sex Delivery Anes PTL Lv  4 Para           3 Para           2 Para           1 Para              ROS: A ROS was performed and pertinent positives and negatives are included in the history.  GENERAL: No fevers or chills. HEENT: No change in vision, no earache, sore throat or sinus congestion. NECK: No pain or stiffness. CARDIOVASCULAR: No chest pain or pressure. No palpitations. PULMONARY: No shortness of breath, cough or wheeze. GASTROINTESTINAL: No abdominal pain, nausea, vomiting or diarrhea, melena or bright red blood per rectum. GENITOURINARY: No urinary frequency, urgency, hesitancy or dysuria. MUSCULOSKELETAL: No joint or muscle pain, no back pain, no recent trauma. DERMATOLOGIC: No rash, no itching, no lesions. ENDOCRINE: No polyuria, polydipsia, no heat or  cold intolerance. No recent change in weight. HEMATOLOGICAL: No anemia or easy bruising or bleeding. NEUROLOGIC: No headache, seizures, numbness, tingling or weakness. PSYCHIATRIC: No depression, no loss of interest in normal activity or change in sleep pattern.     Exam:   BP 130/80   Ht 5\' 2"  (1.575 m)   Wt 152 lb (68.9 kg)   BMI 27.80 kg/m   Body mass index is 27.8 kg/m.  General appearance : Well developed well nourished female. No acute distress HEENT: Eyes: no retinal hemorrhage or exudates,  Neck supple, trachea midline, no carotid bruits, no thyroidmegaly Lungs: Clear to auscultation, no rhonchi or wheezes, or rib retractions  Heart: Regular rate and rhythm, no murmurs or gallops Breast:Examined in sitting and supine position were symmetrical in appearance, no palpable masses or tenderness,  no skin retraction, no nipple inversion, no nipple discharge, no skin discoloration, no axillary or supraclavicular lymphadenopathy Abdomen: no palpable masses or tenderness, no rebound or guarding Extremities: no edema or skin discoloration or tenderness  Pelvic: Vulva: Normal             Vagina: No gross lesions or discharge.  Cystocele grade 2/4, rectocele grade 1/3 with valsalva.  Cervix/Uterus absent  Adnexa  Without masses or tenderness  Anus: Normal   Assessment/Plan:  83 y.o. female for annual exam  1. Well female exam with routine gynecological exam Gynecologic exam status post hysterectomy with a cystocele grade 2/4 and rectocele grade 1/3.  Minimal symptoms from the cystorectocele.  No indication to repeat Pap test at this time.  Breasts normal.  Last screening mammogram July 2019 was negative.  Health labs with family physician.  Cologuard negative in 2019.  Body mass index 27.8.  Continue with physical activity.  2. Postmenopausal Well on no hormone replacement therapy.  Last bone density in December 2016 was completely normal.  We will repeat a bone density in December  2021.  3. Urinary urgency Counseling on urinary urgency done.  Princess Bruins MD, 3:59 PM 05/04/2018

## 2018-05-07 ENCOUNTER — Encounter: Payer: Self-pay | Admitting: Obstetrics & Gynecology

## 2018-05-07 NOTE — Patient Instructions (Signed)
1. Well female exam with routine gynecological exam Gynecologic exam status post hysterectomy with a cystocele grade 2/4 and rectocele grade 1/3.  Minimal symptoms from the cystorectocele.  No indication to repeat Pap test at this time.  Breasts normal.  Last screening mammogram July 2019 was negative.  Health labs with family physician.  Cologuard negative in 2019.  Body mass index 27.8.  Continue with physical activity.  2. Postmenopausal Well on no hormone replacement therapy.  Last bone density in December 2016 was completely normal.  We will repeat a bone density in December 2021.  3. Urinary urgency Counseling on urinary urgency done.  Aastha, it was a pleasure seeing you today!

## 2018-05-12 ENCOUNTER — Other Ambulatory Visit: Payer: Self-pay | Admitting: Endocrinology

## 2018-06-14 ENCOUNTER — Telehealth: Payer: Self-pay | Admitting: Endocrinology

## 2018-06-14 ENCOUNTER — Other Ambulatory Visit: Payer: Self-pay | Admitting: Endocrinology

## 2018-06-14 MED ORDER — CIPROFLOXACIN HCL 500 MG PO TABS: 500.0000 mg | ORAL_TABLET | Freq: Two times a day (BID) | ORAL | 0 refills | Status: DC

## 2018-06-14 NOTE — Telephone Encounter (Signed)
Called pt and informed her of new Rx. Pt verbalized understanding.

## 2018-06-14 NOTE — Telephone Encounter (Signed)
I am sending a prescription for Cipro, let her know

## 2018-06-14 NOTE — Telephone Encounter (Signed)
Pt states that she is experiencing painful urination, urgency, frequency, and lower abdominal pain. Pt took two days of OTC AZO tablets and it did help, but after two days, the symptoms returned.

## 2018-06-14 NOTE — Telephone Encounter (Signed)
Need to know what symptoms she has had and how long

## 2018-06-14 NOTE — Telephone Encounter (Signed)
Patient called and advised that she has a UTI and she needs to have an RX called into the [facility] on [address]

## 2018-06-14 NOTE — Telephone Encounter (Signed)
Please advise 

## 2018-06-26 DIAGNOSIS — H43813 Vitreous degeneration, bilateral: Secondary | ICD-10-CM | POA: Diagnosis not present

## 2018-06-26 DIAGNOSIS — H02831 Dermatochalasis of right upper eyelid: Secondary | ICD-10-CM | POA: Diagnosis not present

## 2018-06-26 DIAGNOSIS — Z961 Presence of intraocular lens: Secondary | ICD-10-CM | POA: Diagnosis not present

## 2018-06-26 DIAGNOSIS — H04123 Dry eye syndrome of bilateral lacrimal glands: Secondary | ICD-10-CM | POA: Diagnosis not present

## 2018-08-14 ENCOUNTER — Other Ambulatory Visit: Payer: Self-pay

## 2018-08-14 ENCOUNTER — Other Ambulatory Visit (INDEPENDENT_AMBULATORY_CARE_PROVIDER_SITE_OTHER): Payer: Medicare Other

## 2018-08-14 DIAGNOSIS — E782 Mixed hyperlipidemia: Secondary | ICD-10-CM

## 2018-08-14 DIAGNOSIS — F320 Major depressive disorder, single episode, mild: Secondary | ICD-10-CM

## 2018-08-14 DIAGNOSIS — R030 Elevated blood-pressure reading, without diagnosis of hypertension: Secondary | ICD-10-CM | POA: Diagnosis not present

## 2018-08-14 DIAGNOSIS — R7303 Prediabetes: Secondary | ICD-10-CM | POA: Diagnosis not present

## 2018-08-14 DIAGNOSIS — F32A Depression, unspecified: Secondary | ICD-10-CM

## 2018-08-14 LAB — CBC
HCT: 37.8 % (ref 36.0–46.0)
Hemoglobin: 12.3 g/dL (ref 12.0–15.0)
MCHC: 32.7 g/dL (ref 30.0–36.0)
MCV: 82.9 fl (ref 78.0–100.0)
Platelets: 257 10*3/uL (ref 150.0–400.0)
RBC: 4.56 Mil/uL (ref 3.87–5.11)
RDW: 14.1 % (ref 11.5–15.5)
WBC: 6.1 10*3/uL (ref 4.0–10.5)

## 2018-08-14 LAB — COMPREHENSIVE METABOLIC PANEL
ALT: 15 U/L (ref 0–35)
AST: 19 U/L (ref 0–37)
Albumin: 4.2 g/dL (ref 3.5–5.2)
Alkaline Phosphatase: 52 U/L (ref 39–117)
BUN: 17 mg/dL (ref 6–23)
CO2: 25 mEq/L (ref 19–32)
Calcium: 9.6 mg/dL (ref 8.4–10.5)
Chloride: 107 mEq/L (ref 96–112)
Creatinine, Ser: 0.87 mg/dL (ref 0.40–1.20)
GFR: 62.47 mL/min (ref >60.00)
Glucose, Bld: 98 mg/dL (ref 70–99)
Potassium: 3.9 mEq/L (ref 3.5–5.1)
Sodium: 141 mEq/L (ref 135–145)
Total Bilirubin: 0.9 mg/dL (ref 0.2–1.2)
Total Protein: 6.7 g/dL (ref 6.0–8.3)

## 2018-08-14 LAB — LIPID PANEL
Cholesterol: 146 mg/dL (ref 0–200)
HDL: 54 mg/dL (ref >39.00)
LDL Cholesterol: 73 mg/dL (ref 0–99)
NonHDL: 91.77
Total CHOL/HDL Ratio: 3
Triglycerides: 95 mg/dL (ref 0.0–149.0)
VLDL: 19 mg/dL (ref 0.0–40.0)

## 2018-08-14 LAB — HEMOGLOBIN A1C: Hgb A1c MFr Bld: 6.3 % (ref 4.6–6.5)

## 2018-08-15 LAB — LIPOPROTEIN ANALYSIS BY NMR
HDL Particle Number: 42.8 umol/L (ref >=30.5)
LDL Particle Number: 1233 nmol/L — ABNORMAL HIGH (ref <1000)
LDL Size: 20.8 nm (ref >20.5)
LP-IR Score: 42 (ref <=45)
Small LDL Particle Number: 574 nmol/L — ABNORMAL HIGH (ref <=527)

## 2018-08-16 ENCOUNTER — Encounter: Payer: Self-pay | Admitting: Endocrinology

## 2018-08-16 ENCOUNTER — Ambulatory Visit (INDEPENDENT_AMBULATORY_CARE_PROVIDER_SITE_OTHER): Payer: Medicare Other | Admitting: Endocrinology

## 2018-08-16 ENCOUNTER — Other Ambulatory Visit: Payer: Self-pay

## 2018-08-16 VITALS — BP 128/68 | HR 85 | Temp 98.8°F | Ht 62.0 in | Wt 150.0 lb

## 2018-08-16 DIAGNOSIS — R7301 Impaired fasting glucose: Secondary | ICD-10-CM | POA: Diagnosis not present

## 2018-08-16 DIAGNOSIS — E782 Mixed hyperlipidemia: Secondary | ICD-10-CM | POA: Diagnosis not present

## 2018-08-16 DIAGNOSIS — E559 Vitamin D deficiency, unspecified: Secondary | ICD-10-CM | POA: Diagnosis not present

## 2018-08-16 MED ORDER — NIACIN ER (ANTIHYPERLIPIDEMIC) 500 MG PO TBCR: 500.0000 mg | EXTENDED_RELEASE_TABLET | Freq: Every day | ORAL | 1 refills | Status: DC

## 2018-08-16 NOTE — Progress Notes (Signed)
Patient ID: PRESLY STEINRUCK, female   DOB: Sep 17, 1935, 83 y.o.   MRN: [id number]    Chief complaint: Followup of various problems   History of Present Illness:   PROBLEM 1: PREDIABETES:   History of mild impaired fasting glucose, glucose is 98, in 2019 was higher at 102 As before her A1c tends to be in prediabetic range and about the same now  Her weight is about the same  She is usually fairly good with her diet but thinks she can do better with cutting back on certain high-fat foods or ice cream  She is only doing some yard work and not much exercise Usually does better when she is doing water exercises which she cannot do right now  Wt Readings from Last 3 Encounters:  08/16/18 150 lb (68 kg)  05/04/18 152 lb (68.9 kg)  02/09/18 150 lb 6.4 oz (68.2 kg)    Lab Results  Component Value Date   HGBA1C 6.3 08/14/2018   HGBA1C 6.2 02/06/2018   HGBA1C 6.3 08/08/2017   Lab Results  Component Value Date   LDLCALC 73 08/14/2018   CREATININE 0.87 08/14/2018     PROBLEM 2:    High lipids since 1980s. Highest Direct LDL 209.  Initially given Lipitor in 2001. Was well controlled with Lipitor 20 mg and no side effects. Had myopathy with Zocor.   However LDL had increased to 154 and was changed to Crestor in 6/04.  This was increased to 40 mg because of LDL particle number of 2343.  Zetia added in 2/09 also and LDL particle number was 1289 in 12/13.   LDL particle number was 1115 last, now over 1200 despite her LDL being about the same.  Triglycerides have been previously high, now consistently normal HDL is fairly good  LDL has been in the 70s usually  As discussed before her diet can be somewhat better with dairy fats  Lipids are as follows:   Lab Results  Component Value Date   CHOL 146 08/14/2018   CHOL 152 02/06/2018   CHOL 143 08/08/2017   Lab Results  Component Value Date   HDL 54.00 08/14/2018   HDL 48.60 02/06/2018   HDL 50.70 08/08/2017   Lab  Results  Component Value Date   LDLCALC 73 08/14/2018   LDLCALC 79 02/06/2018   LDLCALC 71 08/08/2017   Lab Results  Component Value Date   TRIG 95.0 08/14/2018   TRIG 118.0 02/06/2018   TRIG 104.0 08/08/2017   Lab Results  Component Value Date   CHOLHDL 3 08/14/2018   CHOLHDL 3 02/06/2018   CHOLHDL 3 08/08/2017   No results found for: LDLDIRECT      Allergies as of 08/16/2018      Reactions   Bee Venom Anaphylaxis   Simvastatin    Myopathy   Codeine Nausea Only   Evista [raloxifene]    Hot flashes   Percocet [oxycodone-acetaminophen] Nausea And Vomiting   Nausea and vomiting      Medication List       Accurate as of August 16, 2018  9:57 AM. If you have any questions, ask your nurse or doctor.        STOP taking these medications   ciprofloxacin 500 MG tablet Commonly known as: Cipro Stopped by: Elayne Snare, MD     TAKE these medications   acetaminophen 325 MG tablet Commonly known as: TYLENOL Take 650 mg by mouth every 6 (six) hours as needed for pain.  CALCIUM + D PO Take 1 tablet by mouth daily.   cholecalciferol 10 MCG (400 UNIT) Tabs tablet Commonly known as: VITAMIN D3 Take 5,000 Units by mouth.   ezetimibe 10 MG tablet Commonly known as: ZETIA TAKE 1 TABLET DAILY   rosuvastatin 40 MG tablet Commonly known as: CRESTOR TAKE 1 TABLET DAILY   sertraline 25 MG tablet Commonly known as: ZOLOFT TAKE 1 TABLET DAILY   vitamin C 500 MG tablet Commonly known as: ASCORBIC ACID Take 500 mg by mouth daily.       Allergies:  Allergies  Allergen Reactions  . Bee Venom Anaphylaxis  . Simvastatin     Myopathy  . Codeine Nausea Only  . Evista [Raloxifene]     Hot flashes  . Percocet [Oxycodone-Acetaminophen] Nausea And Vomiting    Nausea and vomiting    Past Medical History:  Diagnosis Date  . Cancer ([place])    SMALL [facility]ACE-  MELANOMA REMOVED FROM BACK   . Depression   . Hypercholesterolemia   . Hypertension    Episode of severe  anxiety/amnesia 10/08.  Past Surgical History:  Procedure Laterality Date  . ABDOMINAL HYSTERECTOMY     VAGINAL HYSTERECTOMY WITH OVARIAN PRESERVATION   . ABDOMINOPLASTY  1998  . APPENDECTOMY    . BREAST SURGERY  1985   BREAST REDUCTION   . TONSILLECTOMY AND ADENOIDECTOMY      Family History  Problem Relation Age of Onset  . Hypertension Mother   . Heart disease Father   . Diabetes Father   . Cancer Sister 78       OVARIAN   . Thyroid disease Sister   . Heart disease Brother   . Thyroid disease Daughter   . Diabetes Paternal Grandmother      Social History:  reports that she has never smoked. She has never used smokeless tobacco. She reports that she does not drink alcohol. No history on file for drug.  Review of Systems -   HISTORY of osteopenia: bone density from gynecologist is normal in 12/16  History of anemia.  Hb 11.1 in 1/14 associated with iron deficiency, no recurrence.  Hemoglobin now consistently normal  Lab Results  Component Value Date   WBC 6.1 08/14/2018   HGB 12.3 08/14/2018   HCT 37.8 08/14/2018   MCV 82.9 08/14/2018   PLT 257.0 08/14/2018      Vitamin D deficiency: The baseline level was 20. Last level as follows:  She has been taking 2000 units daily  Lab Results  Component Value Date   VD25OH 49.86 02/06/2018   VD25OH 42.80 08/06/2016   VD25OH 39 01/21/2015    History of Euthyroid goiter: Last TSH normal   Lab Results  Component Value Date   TSH 0.53 02/06/2018   TSH 0.55 02/07/2017   TSH 0.94 02/06/2016   FREET4 0.74 02/10/2015   FREET4 0.96 02/04/2014   FREET4 0.77 10/03/2012     EXAM:  BP 128/68 (BP Location: Right Arm, Patient Position: Sitting, Cuff Size: Normal)   Pulse 85   Temp 98.8 F (37.1 C) (Oral)   Ht 5\' 2"  (1.575 m)   Wt 150 lb (68 kg)   SpO2 97%   BMI 27.44 kg/m      Assessment/Plan:    1.   HYPERLIPIDEMIA: Has had fairly good control of Lipids with combination of 40 mg CRESTOR and Zetia  Triglycerides are normal However her LDL particle number is higher than usual at about 1200+ This is despite her LDL  being slightly better than her last visit and in the low 70s  Although she can do better with diet she agrees to a trial of Niaspan starting with 500 mg and if tolerated go to 1000 She will take aspirin 30 minutes before this at night with a snack  2.  History of osteopenia: Followed by gynecologist  3.  Impaired fasting glucose; highest 108 previously and now consistently below 100  She has not exercised as much but also can watch her diet with cutting back on high fat dairy products and ice cream   4.  History of hypertension: Not on treatment Blood pressure is today quite normal   5.  Vitamin D deficiency: Previously normal She needs periodic levels  6.  No recurrence of anemia    Elayne Snare 08/16/2018, 9:57 AM

## 2018-08-16 NOTE — Patient Instructions (Signed)
Start taking generic Niaspan 500 with a small snack at bedtime.  Take a coated 325 mg aspirin about 20 to 30-minute before doing this If tolerated after 1 month we will try to increase the dose to 1000 mg

## 2018-09-27 ENCOUNTER — Encounter: Payer: Self-pay | Admitting: Endocrinology

## 2018-09-27 DIAGNOSIS — Z1231 Encounter for screening mammogram for malignant neoplasm of breast: Secondary | ICD-10-CM | POA: Diagnosis not present

## 2018-10-10 ENCOUNTER — Other Ambulatory Visit: Payer: Self-pay | Admitting: Endocrinology

## 2018-12-18 ENCOUNTER — Other Ambulatory Visit (INDEPENDENT_AMBULATORY_CARE_PROVIDER_SITE_OTHER): Payer: Medicare Other

## 2018-12-18 ENCOUNTER — Other Ambulatory Visit: Payer: Self-pay

## 2018-12-18 DIAGNOSIS — E782 Mixed hyperlipidemia: Secondary | ICD-10-CM | POA: Diagnosis not present

## 2018-12-18 DIAGNOSIS — E559 Vitamin D deficiency, unspecified: Secondary | ICD-10-CM

## 2018-12-18 LAB — COMPREHENSIVE METABOLIC PANEL
ALT: 13 U/L (ref 0–35)
AST: 21 U/L (ref 0–37)
Albumin: 4.3 g/dL (ref 3.5–5.2)
Alkaline Phosphatase: 60 U/L (ref 39–117)
BUN: 12 mg/dL (ref 6–23)
CO2: 30 mEq/L (ref 19–32)
Calcium: 9.4 mg/dL (ref 8.4–10.5)
Chloride: 104 mEq/L (ref 96–112)
Creatinine, Ser: 0.83 mg/dL (ref 0.40–1.20)
GFR: 65.9 mL/min (ref >60.00)
Glucose, Bld: 97 mg/dL (ref 70–99)
Potassium: 3.8 mEq/L (ref 3.5–5.1)
Sodium: 141 mEq/L (ref 135–145)
Total Bilirubin: 1 mg/dL (ref 0.2–1.2)
Total Protein: 7.1 g/dL (ref 6.0–8.3)

## 2018-12-18 LAB — LIPID PANEL
Cholesterol: 145 mg/dL (ref 0–200)
HDL: 45.9 mg/dL
LDL Cholesterol: 70 mg/dL (ref 0–99)
NonHDL: 99.12
Total CHOL/HDL Ratio: 3
Triglycerides: 144 mg/dL (ref 0.0–149.0)
VLDL: 28.8 mg/dL (ref 0.0–40.0)

## 2018-12-18 LAB — VITAMIN D 25 HYDROXY (VIT D DEFICIENCY, FRACTURES): VITD: 49.06 ng/mL (ref 30.00–100.00)

## 2018-12-20 ENCOUNTER — Encounter: Payer: Self-pay | Admitting: Endocrinology

## 2018-12-20 ENCOUNTER — Other Ambulatory Visit: Payer: Self-pay

## 2018-12-20 ENCOUNTER — Ambulatory Visit (INDEPENDENT_AMBULATORY_CARE_PROVIDER_SITE_OTHER): Payer: Medicare Other | Admitting: Endocrinology

## 2018-12-20 VITALS — BP 120/70 | HR 76 | Ht 62.0 in | Wt 146.0 lb

## 2018-12-20 DIAGNOSIS — E782 Mixed hyperlipidemia: Secondary | ICD-10-CM | POA: Diagnosis not present

## 2018-12-20 DIAGNOSIS — E049 Nontoxic goiter, unspecified: Secondary | ICD-10-CM | POA: Diagnosis not present

## 2018-12-20 DIAGNOSIS — R7301 Impaired fasting glucose: Secondary | ICD-10-CM

## 2018-12-20 NOTE — Progress Notes (Signed)
Patient ID: Barbara Maxwell, female   DOB: 07-25-1935, 83 y.o.   MRN: [id number]    Chief complaint: Followup of various problems   History of Present Illness:   PROBLEM 1: PREDIABETES:   History of mild impaired fasting glucose, Fasting glucose is still normal at 97, in 2019 was higher at 102 Usually her A1c tends to be in prediabetic range and about the same over June  Her weight is down slightly  She is usually fairly good with her diet except occasionally having sweets  She has only occasionally try to get on her treadmill Usually does better when she is doing water exercises but has not been able to go back to the pool  Wt Readings from Last 3 Encounters:  12/20/18 146 lb (66.2 kg)  08/16/18 150 lb (68 kg)  05/04/18 152 lb (68.9 kg)    Lab Results  Component Value Date   HGBA1C 6.3 08/14/2018   HGBA1C 6.2 02/06/2018   HGBA1C 6.3 08/08/2017   Lab Results  Component Value Date   LDLCALC 70 12/18/2018   CREATININE 0.83 12/18/2018     PROBLEM 2:    High lipids since 1980s. Highest Direct LDL 209.  Initially given Lipitor in 2001. Was well controlled with Lipitor 20 mg and no side effects. Had myopathy with Zocor.   However LDL had increased to 154 and was changed to Crestor in 6/04.  This was increased to 40 mg because of LDL particle number of 2343.  Zetia added in 2/09 also and LDL particle number was 1289 in 12/13.   LDL particle number was 1115 last, last over 1200 despite her LDL being about the same.  Triglycerides have been previously high, now 144 HDL is normal but not as good as on the last visit  She was given a trial of Niaspan in June but she thinks it caused headaches and could not continue despite taking aspirin prior to the medication  LDL has been in the 70s usually and at 70 now  Weight is slightly better  Lipids are as follows:   Lab Results  Component Value Date   CHOL 145 12/18/2018   CHOL 146 08/14/2018   CHOL 152  02/06/2018   Lab Results  Component Value Date   HDL 45.90 12/18/2018   HDL 54.00 08/14/2018   HDL 48.60 02/06/2018   Lab Results  Component Value Date   LDLCALC 70 12/18/2018   LDLCALC 73 08/14/2018   LDLCALC 79 02/06/2018   Lab Results  Component Value Date   TRIG 144.0 12/18/2018   TRIG 95.0 08/14/2018   TRIG 118.0 02/06/2018   Lab Results  Component Value Date   CHOLHDL 3 12/18/2018   CHOLHDL 3 08/14/2018   CHOLHDL 3 02/06/2018   No results found for: LDLDIRECT    Allergies as of 12/20/2018      Reactions   Bee Venom Anaphylaxis   Simvastatin    Myopathy   Codeine Nausea Only   Evista [raloxifene]    Hot flashes   Percocet [oxycodone-acetaminophen] Nausea And Vomiting   Nausea and vomiting      Medication List       Accurate as of December 20, 2018  9:23 AM. If you have any questions, ask your nurse or doctor.        STOP taking these medications   niacin 500 MG CR tablet Commonly known as: NIASPAN Stopped by: Elayne Snare, MD     TAKE these medications  acetaminophen 325 MG tablet Commonly known as: TYLENOL Take 650 mg by mouth every 6 (six) hours as needed for pain.   CALCIUM + D PO Take 1 tablet by mouth daily.   cholecalciferol 10 MCG (400 UNIT) Tabs tablet Commonly known as: VITAMIN D3 Take 5,000 Units by mouth.   ezetimibe 10 MG tablet Commonly known as: ZETIA TAKE 1 TABLET DAILY   rosuvastatin 40 MG tablet Commonly known as: CRESTOR TAKE 1 TABLET DAILY   sertraline 25 MG tablet Commonly known as: ZOLOFT TAKE 1 TABLET DAILY   vitamin C 500 MG tablet Commonly known as: ASCORBIC ACID Take 500 mg by mouth daily.       Allergies:  Allergies  Allergen Reactions  . Bee Venom Anaphylaxis  . Simvastatin     Myopathy  . Codeine Nausea Only  . Evista [Raloxifene]     Hot flashes  . Percocet [Oxycodone-Acetaminophen] Nausea And Vomiting    Nausea and vomiting    Past Medical History:  Diagnosis Date  . Cancer ([place])     [facility]ACE-  MELANOMA REMOVED FROM BACK   . Depression   . Hypercholesterolemia   . Hypertension    Episode of severe anxiety/amnesia 10/08.  Past Surgical History:  Procedure Laterality Date  . ABDOMINAL HYSTERECTOMY     VAGINAL HYSTERECTOMY WITH OVARIAN PRESERVATION   . ABDOMINOPLASTY  1998  . APPENDECTOMY    . BREAST SURGERY  1985   BREAST REDUCTION   . TONSILLECTOMY AND ADENOIDECTOMY      Family History  Problem Relation Age of Onset  . Hypertension Mother   . Heart disease Father   . Diabetes Father   . Cancer Sister 43       OVARIAN   . Thyroid disease Sister   . Heart disease Brother   . Thyroid disease Daughter   . Diabetes Paternal Grandmother      Social History:  reports that she has never smoked. She has never used smokeless tobacco. She reports that she does not drink alcohol. No history on file for drug.  Review of Systems -   HISTORY of osteopenia: bone density from gynecologist is normal in 12/16 and will likely get another follow-up next year  History of anemia.  Hb 11.1 in 1/14 associated with iron deficiency, no recurrence.  Hemoglobin now consistently normal  Lab Results  Component Value Date   WBC 6.1 08/14/2018   HGB 12.3 08/14/2018   HCT 37.8 08/14/2018   MCV 82.9 08/14/2018   PLT 257.0 08/14/2018      Vitamin D deficiency: The baseline level was 20.  She has been taking 2000 units daily Her vitamin D level is consistently normal  Lab Results  Component Value Date   VD25OH 49.06 12/18/2018   VD25OH 49.86 02/06/2018   VD25OH 42.80 08/06/2016    History of Euthyroid goiter: Last TSH normal   Lab Results  Component Value Date   TSH 0.53 02/06/2018   TSH 0.55 02/07/2017   TSH 0.94 02/06/2016   FREET4 0.74 02/10/2015   FREET4 0.96 02/04/2014   FREET4 0.77 10/03/2012     EXAM:  BP 120/70 (BP Location: Left Arm, Patient Position: Sitting, Cuff Size: Normal)   Pulse 76   Ht 5\' 2"  (1.575 m)   Wt 146 lb (66.2 kg)   SpO2  97%   BMI 26.70 kg/m      Assessment/Plan:    1.   HYPERLIPIDEMIA: Treated with combination of 40 mg CRESTOR and Zetia  She has dyslipidemia with high LDL particle number and size is small Triglycerides are upper normal She is intolerant to niacin  Discussed need to exercise regularly and keep her weight down  Continue Zetia and Crestor unchanged, will possibly consider any newer medications next year when covered by insurance  2.  History of osteopenia: Followed by gynecologist  3.  Impaired fasting glucose; highest 108 previously and now consistently below 100  Will need to periodically monitor A1c and again discussed exercise and healthy diet   4.  History of hypertension: Not on treatment Blood pressure is still quite normal   5.  Vitamin D deficiency: Blood level is normal normal She needs to continue same dose  She has had the influenza vaccine at the pharmacy    Elayne Snare 12/20/2018, 9:23 AM

## 2018-12-27 DIAGNOSIS — D2239 Melanocytic nevi of other parts of face: Secondary | ICD-10-CM | POA: Diagnosis not present

## 2018-12-27 DIAGNOSIS — D1801 Hemangioma of skin and subcutaneous tissue: Secondary | ICD-10-CM | POA: Diagnosis not present

## 2018-12-27 DIAGNOSIS — Z85820 Personal history of malignant melanoma of skin: Secondary | ICD-10-CM | POA: Diagnosis not present

## 2018-12-27 DIAGNOSIS — D225 Melanocytic nevi of trunk: Secondary | ICD-10-CM | POA: Diagnosis not present

## 2018-12-27 DIAGNOSIS — B078 Other viral warts: Secondary | ICD-10-CM | POA: Diagnosis not present

## 2018-12-27 DIAGNOSIS — L814 Other melanin hyperpigmentation: Secondary | ICD-10-CM | POA: Diagnosis not present

## 2018-12-27 DIAGNOSIS — L821 Other seborrheic keratosis: Secondary | ICD-10-CM | POA: Diagnosis not present

## 2018-12-27 DIAGNOSIS — D2262 Melanocytic nevi of left upper limb, including shoulder: Secondary | ICD-10-CM | POA: Diagnosis not present

## 2018-12-27 DIAGNOSIS — D2261 Melanocytic nevi of right upper limb, including shoulder: Secondary | ICD-10-CM | POA: Diagnosis not present

## 2019-04-07 ENCOUNTER — Ambulatory Visit: Payer: Medicare Other | Attending: Internal Medicine

## 2019-04-07 DIAGNOSIS — Z23 Encounter for immunization: Secondary | ICD-10-CM | POA: Insufficient documentation

## 2019-04-07 NOTE — Progress Notes (Signed)
   Covid-19 Vaccination Clinic  Name:  Barbara Maxwell    MRN: [id number] DOB: 06-Sep-1935  04/07/2019  [name] was observed post Covid-19 immunization for 15 minutes without incidence. She was provided with Vaccine Information Sheet and instruction to access the V-Safe system.   [name] was instructed to call 911 with any severe reactions post vaccine: [name] Difficulty breathing  . Swelling of your face and throat  . A fast heartbeat  . A bad rash all over your body  . Dizziness and weakness    Immunizations Administered    Name Date Dose VIS Date Route   Pfizer COVID-19 Vaccine 04/07/2019  8:48 AM 0.3 mL 02/02/2019 Intramuscular   Manufacturer: [place]   Lot: [id number]   [place]: [id number]

## 2019-04-25 ENCOUNTER — Other Ambulatory Visit: Payer: Self-pay | Admitting: Endocrinology

## 2019-04-29 ENCOUNTER — Ambulatory Visit: Payer: Medicare Other | Attending: Internal Medicine

## 2019-04-29 DIAGNOSIS — Z23 Encounter for immunization: Secondary | ICD-10-CM | POA: Insufficient documentation

## 2019-04-29 NOTE — Progress Notes (Signed)
   Covid-19 Vaccination Clinic  Name:  Barbara Maxwell    MRN: [id number] DOB: April 28, 1935  04/29/2019  Barbara Maxwell was observed post Covid-19 immunization for 15 minutes without incident. She was provided with Vaccine Information Sheet and instruction to access the V-Safe system.   Barbara Maxwell was instructed to call 911 with any severe reactions post vaccine: [name] Difficulty breathing  . Swelling of face and throat  . A fast heartbeat  . A bad rash all over body  . Dizziness and weakness   Immunizations Administered    Name Date Dose VIS Date Route   Pfizer COVID-19 Vaccine 04/29/2019 11:01 AM 0.3 mL 02/02/2019 Intramuscular   Manufacturer: [place]   Lot: [id number]   [place]: [id number]

## 2019-05-07 ENCOUNTER — Other Ambulatory Visit: Payer: Self-pay | Admitting: Endocrinology

## 2019-06-04 ENCOUNTER — Other Ambulatory Visit: Payer: Self-pay

## 2019-06-05 ENCOUNTER — Encounter: Payer: Self-pay | Admitting: Obstetrics & Gynecology

## 2019-06-05 ENCOUNTER — Ambulatory Visit (INDEPENDENT_AMBULATORY_CARE_PROVIDER_SITE_OTHER): Payer: Medicare Other | Admitting: Obstetrics & Gynecology

## 2019-06-05 VITALS — BP 124/82 | Ht 66.0 in | Wt 143.6 lb

## 2019-06-05 DIAGNOSIS — Z780 Asymptomatic menopausal state: Secondary | ICD-10-CM | POA: Diagnosis not present

## 2019-06-05 DIAGNOSIS — R3915 Urgency of urination: Secondary | ICD-10-CM

## 2019-06-05 DIAGNOSIS — Z01419 Encounter for gynecological examination (general) (routine) without abnormal findings: Secondary | ICD-10-CM

## 2019-06-05 DIAGNOSIS — Z90710 Acquired absence of both cervix and uterus: Secondary | ICD-10-CM | POA: Diagnosis not present

## 2019-06-05 NOTE — Patient Instructions (Signed)
1. Well female exam with routine gynecological exam Gynecologic exam status post total hysterectomy.  No indication to repeat Pap tests at this point.  Breast exam normal.  Screening mammogram in August 2020 was negative.  Health labs with family physician.  Good body mass index at 23.18.  Recommend resuming more frequent physical activities with walking and water aerobics if possible.  Healthy nutrition.  2. S/P vaginal hysterectomy  3. Postmenopausal Well on no hormone replacement therapy.  Last bone density December 2016 was normal.  Will repeat a bone density at 5 years in December 2021. - DG Bone Density; Future  4. Urinary urgency Urinary urgency especially at night but no urinary incontinence.  Precautions reviewed with patient, will try not drinking too late at night and will empty her bladder before going to bed.  No indication for medical treatment at this time.  Ferris, it was a pleasure seeing you today!

## 2019-06-05 NOTE — Progress Notes (Signed)
Barbara Maxwell 1935/12/30 [id number]   History:    84 y.o. G4P4L3 Widowed.  Husband and sister died in 2018/06/09.  Daughter suicide 02/2019.  RP:  Established patient presenting for annual gyn exam   HPI: S/P Vaginal Hysterectomy.  Menopause, well on no hormone replacement therapy.  No pelvic pain.  Abstinent.  Urine with mild urgency, but no incontinence.  Bowel movements normal.  Breasts normal.  Body mass index decreased to 23.18 secondary to grieving.  Physically active, but no longer doing water aerobics since Covid.  Health labs with family physician.    Past medical history,surgical history, family history and social history were all reviewed and documented in the EPIC chart.  Gynecologic History No LMP recorded. Patient is postmenopausal.  Obstetric History OB History  Gravida Para Term Preterm AB Living  4 4       4   SAB TAB Ectopic Multiple Live Births               # Outcome Date GA Lbr Len/2nd Weight Sex Delivery Anes PTL Lv  4 Para           3 Para           2 Para           1 Para              ROS: A ROS was performed and pertinent positives and negatives are included in the history.  GENERAL: No fevers or chills. HEENT: No change in vision, no earache, sore throat or sinus congestion. NECK: No pain or stiffness. CARDIOVASCULAR: No chest pain or pressure. No palpitations. PULMONARY: No shortness of breath, cough or wheeze. GASTROINTESTINAL: No abdominal pain, nausea, vomiting or diarrhea, melena or bright red blood per rectum. GENITOURINARY: No urinary frequency, urgency, hesitancy or dysuria. MUSCULOSKELETAL: No joint or muscle pain, no back pain, no recent trauma. DERMATOLOGIC: No rash, no itching, no lesions. ENDOCRINE: No polyuria, polydipsia, no heat or cold intolerance. No recent change in weight. HEMATOLOGICAL: No anemia or easy bruising or bleeding. NEUROLOGIC: No headache, seizures, numbness, tingling or weakness. PSYCHIATRIC: No depression, no loss of interest in  normal activity or change in sleep pattern.     Exam:   BP 124/82   Ht 5\' 6"  (1.676 m)   Wt 143 lb 9.6 oz (65.1 kg)   BMI 23.18 kg/m   Body mass index is 23.18 kg/m.  General appearance : Well developed well nourished female. No acute distress HEENT: Eyes: no retinal hemorrhage or exudates,  Neck supple, trachea midline, no carotid bruits, no thyroidmegaly Lungs: Clear to auscultation, no rhonchi or wheezes, or rib retractions  Heart: Regular rate and rhythm, no murmurs or gallops Breast:Examined in sitting and supine position were symmetrical in appearance, no palpable masses or tenderness,  no skin retraction, no nipple inversion, no nipple discharge, no skin discoloration, no axillary or supraclavicular lymphadenopathy Abdomen: no palpable masses or tenderness, no rebound or guarding Extremities: no edema or skin discoloration or tenderness  Pelvic: Vulva: Normal             Vagina: No gross lesions or discharge  Cervix/Uterus absent  Adnexa  Without masses or tenderness  Anus: Normal   Assessment/Plan:  84 y.o. female for annual exam   1. Well female exam with routine gynecological exam Gynecologic exam status post total hysterectomy.  No indication to repeat Pap tests at this point.  Breast exam normal.  Screening mammogram in August 2020 was  negative.  Health labs with family physician.  Good body mass index at 23.18.  Recommend resuming more frequent physical activities with walking and water aerobics if possible.  Healthy nutrition.  2. S/P vaginal hysterectomy  3. Postmenopausal Well on no hormone replacement therapy.  Last bone density December 2016 was normal.  Will repeat a bone density at 5 years in December 2021. - DG Bone Density; Future  4. Urinary urgency Urinary urgency especially at night but no urinary incontinence.  Precautions reviewed with patient, will try not drinking too late at night and will empty her bladder before going to bed.  No indication  for medical treatment at this time.  Princess Bruins MD, 9:09 AM 06/05/2019

## 2019-06-18 ENCOUNTER — Other Ambulatory Visit (INDEPENDENT_AMBULATORY_CARE_PROVIDER_SITE_OTHER): Payer: Medicare Other

## 2019-06-18 ENCOUNTER — Other Ambulatory Visit: Payer: Self-pay

## 2019-06-18 DIAGNOSIS — E049 Nontoxic goiter, unspecified: Secondary | ICD-10-CM | POA: Diagnosis not present

## 2019-06-18 DIAGNOSIS — E782 Mixed hyperlipidemia: Secondary | ICD-10-CM

## 2019-06-18 DIAGNOSIS — R7301 Impaired fasting glucose: Secondary | ICD-10-CM

## 2019-06-18 LAB — COMPREHENSIVE METABOLIC PANEL
ALT: 15 U/L (ref 0–35)
AST: 26 U/L (ref 0–37)
Albumin: 4.2 g/dL (ref 3.5–5.2)
Alkaline Phosphatase: 55 U/L (ref 39–117)
BUN: 14 mg/dL (ref 6–23)
CO2: 28 mEq/L (ref 19–32)
Calcium: 9.5 mg/dL (ref 8.4–10.5)
Chloride: 106 mEq/L (ref 96–112)
Creatinine, Ser: 0.88 mg/dL (ref 0.40–1.20)
GFR: 61.52 mL/min (ref >60.00)
Glucose, Bld: 94 mg/dL (ref 70–99)
Potassium: 4.1 mEq/L (ref 3.5–5.1)
Sodium: 140 mEq/L (ref 135–145)
Total Bilirubin: 0.9 mg/dL (ref 0.2–1.2)
Total Protein: 6.9 g/dL (ref 6.0–8.3)

## 2019-06-18 LAB — LIPID PANEL
Cholesterol: 148 mg/dL (ref 0–200)
HDL: 49 mg/dL (ref >39.00)
LDL Cholesterol: 79 mg/dL (ref 0–99)
NonHDL: 99.36
Total CHOL/HDL Ratio: 3
Triglycerides: 104 mg/dL (ref 0.0–149.0)
VLDL: 20.8 mg/dL (ref 0.0–40.0)

## 2019-06-18 LAB — TSH: TSH: 0.71 u[IU]/mL (ref 0.35–4.50)

## 2019-06-18 LAB — HEMOGLOBIN A1C: Hgb A1c MFr Bld: 6.5 % (ref 4.6–6.5)

## 2019-06-21 ENCOUNTER — Encounter: Payer: Self-pay | Admitting: Endocrinology

## 2019-06-21 ENCOUNTER — Encounter: Payer: Medicare Other | Admitting: Endocrinology

## 2019-06-21 ENCOUNTER — Other Ambulatory Visit: Payer: Self-pay

## 2019-06-21 ENCOUNTER — Ambulatory Visit (INDEPENDENT_AMBULATORY_CARE_PROVIDER_SITE_OTHER): Payer: Medicare Other | Admitting: Endocrinology

## 2019-06-21 VITALS — BP 126/70 | HR 75 | Ht 66.0 in | Wt 142.4 lb

## 2019-06-21 DIAGNOSIS — F320 Major depressive disorder, single episode, mild: Secondary | ICD-10-CM

## 2019-06-21 DIAGNOSIS — E049 Nontoxic goiter, unspecified: Secondary | ICD-10-CM | POA: Diagnosis not present

## 2019-06-21 DIAGNOSIS — E782 Mixed hyperlipidemia: Secondary | ICD-10-CM | POA: Diagnosis not present

## 2019-06-21 DIAGNOSIS — F32A Depression, unspecified: Secondary | ICD-10-CM

## 2019-06-21 DIAGNOSIS — Z0000 Encounter for general adult medical examination without abnormal findings: Secondary | ICD-10-CM | POA: Diagnosis not present

## 2019-06-21 MED ORDER — TRAMADOL HCL 50 MG PO TABS: 50.0000 mg | ORAL_TABLET | ORAL | 0 refills | Status: DC | PRN

## 2019-06-21 MED ORDER — SERTRALINE HCL 50 MG PO TABS: 50.0000 mg | ORAL_TABLET | Freq: Every day | ORAL | 0 refills | Status: DC

## 2019-06-21 NOTE — Patient Instructions (Signed)
Periodic mammogram in 8/21  Check breasts for lumps monthly with the palm of the hand  Take  1000 units of Vitamin D3 daily and 500 mg of calcium daily  Low saturated fat diet, low intake of sugar and salt  Regular walking exercise 5 days a week  Regular dental and eye exams as recommended by the specialists  Make sure smoke detectors are functional at home Wear seat belts all the time  Sertraline 2 of 25mg  daily same time

## 2019-06-21 NOTE — Progress Notes (Signed)
Patient ID: Barbara Maxwell, female   DOB: Jun 20, 1935, 84 y.o.   MRN: [id number]   Subjective:    Patient is being seen today for Medicare annual wellness visit and follow-up of chronic problems.     Risk factors: Age over 83, widower  Administrator, [employer] Providing Medical Care to Patient: Gynecologist Dr. Dellis Filbert, ophthalmologist and dermatologist Dr. Elvera Lennox  Activities of Daily Living:  She has no difficulty performing the following activities:  Preparing food and eating, Getting dressed and taking care of daily personal  needs Patient is able to ambulate well without balance issues In the past year the patient has not fallen or had a near fall    Safety: Has smoke detector and wears seat belts. No excess sun exposure.  Diet and Exercise  Current exercise habits:    She does not exercise, has difficulty walking because of hip pain and currently her senior center where she has water exercises is closed  Diet: Follows a heart healthy diet with low fat intake usually  Vitamin supplements: She is taking vitamin D3,2000 units, and also calcium supplement   PREVENTIVE screening/vaccines:  Annual hemoccults:           12/19  Breast self exams:                             Regular  Colonoscopy/sigmoidoscopy  2006  Mammograms: 8/20  Yearly flu vaccine:                      yes   Bone Density:   2016  Calcium supplements:  Yes Tetanus booster:   1998   Zostavax:  2012  Pneumovax:  01/2014    Shingrix: She does not want to take because of cost  Covid vaccine: She is fully vaccinated  She has not done her Hemoccult in 2020  Lab Results  Component Value Date   OCCULTBLD Negative 02/13/2018   OCCULTBLD Negative 02/21/2017   OCCULTBLD Negative 06/13/2014     Depression Screen:  Completed in appropriate section  Advance directives:  She has not brought a copy of her living will, has been requested to bring this  The following portions of the patient's history were reviewed  and updated as appropriate: allergies, current medications, past family history, past medical history, past social history, past surgical history and problem list.     Allergies as of 06/21/2019      Reactions   Bee Venom Anaphylaxis   Simvastatin    Myopathy   Codeine Nausea Only   Evista [raloxifene]    Hot flashes   Percocet [oxycodone-acetaminophen] Nausea And Vomiting   Nausea and vomiting      Medication List       Accurate as of June 21, 2019  1:47 PM. If you have any questions, ask your nurse or doctor.        acetaminophen 325 MG tablet Commonly known as: TYLENOL Take 650 mg by mouth every 6 (six) hours as needed for pain.   CALCIUM + D PO Take 1 tablet by mouth daily.   cholecalciferol 10 MCG (400 UNIT) Tabs tablet Commonly known as: VITAMIN D3 Take 5,000 Units by mouth.   ezetimibe 10 MG tablet Commonly known as: ZETIA TAKE 1 TABLET DAILY   rosuvastatin 40 MG tablet Commonly known as: CRESTOR TAKE 1 TABLET DAILY   sertraline 25 MG tablet Commonly known as: ZOLOFT TAKE 1 TABLET DAILY  vitamin C 500 MG tablet Commonly known as: ASCORBIC ACID Take 500 mg by mouth daily.       Allergies:  Allergies  Allergen Reactions  . Bee Venom Anaphylaxis  . Simvastatin     Myopathy  . Codeine Nausea Only  . Evista [Raloxifene]     Hot flashes  . Percocet [Oxycodone-Acetaminophen] Nausea And Vomiting    Nausea and vomiting    Past Medical History:  Diagnosis Date  . Cancer ([place])    [facility]-  MELANOMA REMOVED FROM BACK   . Depression   . Hypercholesterolemia   . Hypertension     Past Surgical History:  Procedure Laterality Date  . ABDOMINAL HYSTERECTOMY     VAGINAL HYSTERECTOMY WITH OVARIAN PRESERVATION   . ABDOMINOPLASTY  1998  . APPENDECTOMY    . BREAST SURGERY  1985   BREAST REDUCTION   . TONSILLECTOMY AND ADENOIDECTOMY      Family History  Problem Relation Age of Onset  . Hypertension Mother   . Heart disease Father   .  Diabetes Father   . Cancer Sister 12       OVARIAN   . Thyroid disease Sister   . Heart disease Brother   . Thyroid disease Daughter   . Diabetes Paternal Grandmother     Social History:  reports that she has never smoked. She has never used smokeless tobacco. She reports that she does not drink alcohol. No history on file for drug.   Review of Systems:   She has mild hearing loss but not worsening, and no visual loss  Does not have any changes in appetite or significant weight change  Wt Readings from Last 3 Encounters:  06/21/19 142 lb 6.4 oz (64.6 kg)  06/05/19 143 lb 9.6 oz (65.1 kg)  12/20/18 146 lb (66.2 kg)     Objective:    BP 126/70 (BP Location: Left Arm, Patient Position: Sitting, Cuff Size: Normal)   Pulse 75   Ht 5\' 6"  (1.676 m)   Wt 142 lb 6.4 oz (64.6 kg)   SpO2 95%   BMI 22.98 kg/m   Vision:  Normal, has annual eye exams without abnormal findings   Hearing: Mildly impaired  Body mass index:  See vitals Neurological/balance: pt easily and quickly performs "get-up-and-go" from a sitting position  Cognitive Impairment Assessment: cognition and judgment appear normal.   Has excellent recall.   Assessment:   Medicare wellness evaluation done   Preventive parameters reviewed    Plan:   During the course of the visit the patient was educated and counseled about appropriate screening and preventive services including:       Fall prevention discussed   Screening mammography next August Bone densitometry done by gynecologist and to be scheduled for December Diabetes screening shows normal glucose as discussed below Nutrition counseling done Exercise regimen needs to be started, regular walking recommended Lipid screening as discussed below Colorectal screening as discussed below she is asymptomatic  Regular eye and dental exams recommended Aspirin prophylaxis is not indicated She will bring copy of her living will for records  Vaccines /  LABS Zostavax / Pneumococcal Vaccine/Prevnar up-to-date She will need Shingrix but she does not want to do this because of the cost She is up-to-date with Covid vaccines Stool Hemoccult will be given to her as it is overdue, considering her age colonoscopy is not indicated routinely   Patient Instructions (the written plan) was given to the patient.   Patient Instructions  Periodic mammogram in 8/21  Check breasts for lumps monthly with the palm of the hand  Take  1000 units of Vitamin D3 daily and 500 mg of calcium daily  Low saturated fat diet, low intake of sugar and salt  Regular walking exercise 5 days a week  Regular dental and eye exams as recommended by the specialists  Make sure smoke detectors are functional at home Wear seat belts all the time  Sertraline 2 of 25mg  daily same time     Elayne Snare 06/21/19          OFFICE VISIT for medical issues:    HPI    1.  BACK and hip pain: She has had some back and hip pain on the left side which is mostly chronic and recently at times increased.  She is requesting prescription for tramadol which has helped before  2.  ANXIETY and depression: She does feel anxious but does not think she is having any depressive symptoms or insomnia.  However she is somewhat tearful in the office and is grieving over her husband's death as well as loss of her sister and niece.  3.  LIPIDS:Has had consistently good control of Lipids with combination of 40 mg CRESTOR and Zetia Triglycerides are normal again Previously had high LDL particle number even though her LDL is around 70  Lab Results  Component Value Date   CHOL 148 06/18/2019   CHOL 145 12/18/2018   CHOL 146 08/14/2018   Lab Results  Component Value Date   HDL 49.00 06/18/2019   HDL 45.90 12/18/2018   HDL 54.00 08/14/2018   Lab Results  Component Value Date   LDLCALC 79 06/18/2019   LDLCALC 70 12/18/2018   LDLCALC 73 08/14/2018   Lab Results  Component Value  Date   TRIG 104.0 06/18/2019   TRIG 144.0 12/18/2018   TRIG 95.0 08/14/2018   Lab Results  Component Value Date   CHOLHDL 3 06/18/2019   CHOLHDL 3 12/18/2018   CHOLHDL 3 08/14/2018   No results found for: LDLDIRECT    4.  History of osteopenia: 2014 her T score was -1.1 but subsequently in 2016 had had improved to -0.9 at the left femur in 01/2015.  This is done by gynecologist and will be scheduled later this year for 5-year interval  VITAMIN D deficiency: She is taking a total of vitamin D3 2000 units supplement  Lab Results  Component Value Date   VD25OH 49.06 12/18/2018   VD25OH 49.86 02/06/2018   VD25OH 42.80 08/06/2016    5.  Impaired fasting glucose: This is consistently normal with glucose 94 fasting As before A1c still in the prediabetic range long-term   Lab Results  Component Value Date   HGBA1C 6.5 06/18/2019   HGBA1C 6.3 08/14/2018   HGBA1C 6.2 02/06/2018   Lab Results  Component Value Date   LDLCALC 79 06/18/2019   CREATININE 0.88 06/18/2019     6.  Frequency of urination at night: She has been going more often and about 3 times with some urgency and rarely a little dribbling but does not have any difficulty during the daytime and does not request any treatment for this    Physical Exam Neck:     Vascular: No carotid bruit.     Comments: Thyroid enlarged on the right side about 1-1/2-2 times normal, smooth and fleshy.  Not clearly felt on the left.  No nodules Cardiovascular:     Rate and Rhythm: Normal rate  and regular rhythm.     Heart sounds: Normal heart sounds. No murmur.  Neurological:     Mental Status: She is alert.    BP 126/70 (BP Location: Left Arm, Patient Position: Sitting, Cuff Size: Normal)   Pulse 75   Ht 5\' 6"  (1.676 m)   Wt 142 lb 6.4 oz (64.6 kg)   SpO2 95%   BMI 22.98 kg/m    Assessment/Plan:   1.  LIPIDS: Well-controlled  She will continue same regimen  2.  Chronic low back pain without sciatica: This is  relatively chronic with occasional increase in pain.  Agreed to give her Ultram as needed which she has not overused in the past   3.  Abnormal A1c without high glucose: We will continue to follow  4.  Anxiety and depression: She feels more anxious although she is dealing with her grief fairly well and does not think she is depressed or suicidal She agrees to however increase her Zoloft to help with her anxiety and dealing with her grief.  She will take 50 mg daily  5.  History of hypertension: No recurrence and blood pressure is consistently normal.  She also monitors at home  Follow-up in 6 months   Kahla Risdon Dwyane Dee 06/21/19

## 2019-06-27 DIAGNOSIS — Z961 Presence of intraocular lens: Secondary | ICD-10-CM | POA: Diagnosis not present

## 2019-06-27 DIAGNOSIS — H5213 Myopia, bilateral: Secondary | ICD-10-CM | POA: Diagnosis not present

## 2019-06-27 DIAGNOSIS — H04123 Dry eye syndrome of bilateral lacrimal glands: Secondary | ICD-10-CM | POA: Diagnosis not present

## 2019-06-27 DIAGNOSIS — H43813 Vitreous degeneration, bilateral: Secondary | ICD-10-CM | POA: Diagnosis not present

## 2019-07-09 ENCOUNTER — Other Ambulatory Visit: Payer: Self-pay

## 2019-07-09 ENCOUNTER — Other Ambulatory Visit (INDEPENDENT_AMBULATORY_CARE_PROVIDER_SITE_OTHER): Payer: Medicare Other

## 2019-07-09 DIAGNOSIS — Z1211 Encounter for screening for malignant neoplasm of colon: Secondary | ICD-10-CM | POA: Diagnosis not present

## 2019-07-10 LAB — FECAL OCCULT BLOOD, IMMUNOCHEMICAL: Fecal Occult Bld: NEGATIVE

## 2019-07-10 NOTE — Progress Notes (Signed)
Please call to let patient know that the lab results are normal and no further action needed

## 2019-07-11 ENCOUNTER — Ambulatory Visit (HOSPITAL_COMMUNITY)
Admission: EM | Admit: 2019-07-11 | Discharge: 2019-07-11 | Disposition: A | Discharge status: Home / Self Care | Payer: Medicare Other | Attending: Family Medicine | Admitting: Family Medicine

## 2019-07-11 ENCOUNTER — Encounter (HOSPITAL_COMMUNITY): Payer: Self-pay

## 2019-07-11 ENCOUNTER — Other Ambulatory Visit: Payer: Self-pay

## 2019-07-11 DIAGNOSIS — N390 Urinary tract infection, site not specified: Secondary | ICD-10-CM | POA: Insufficient documentation

## 2019-07-11 LAB — POCT URINALYSIS DIP (DEVICE)
Glucose, UA: 100 mg/dL — AB
Leukocytes,Ua: NEGATIVE
Nitrite: POSITIVE — AB
Protein, ur: 30 mg/dL — AB
Specific Gravity, Urine: 1.02 (ref 1.005–1.030)
Urobilinogen, UA: 1 mg/dL (ref 0.0–1.0)
pH: 5.5 (ref 5.0–8.0)

## 2019-07-11 MED ORDER — CEPHALEXIN 500 MG PO CAPS
500.0000 mg | ORAL_CAPSULE | Freq: Two times a day (BID) | ORAL | 0 refills | Status: AC
Start: 2019-07-11 — End: 2019-07-18

## 2019-07-11 NOTE — ED Provider Notes (Addendum)
[facility]    CSN: [id number] Arrival date & time: 07/11/19  1035      History   Chief Complaint Chief Complaint  Patient presents with  . Abdominal Pain  . Dysuria    HPI Barbara Maxwell is a 84 y.o. female.   Patient is an 84 year old female that presents today with approximately 2 days of lower abdominal, lower back discomfort.  She is also has associated dysuria and urinary frequency.  Has been drinking cranberry juice and took AZO yesterday for symptoms.  Patient reports last night she was getting into the bath and felt dizzy and fell inside the tub.  Denies hitting head or loss of consciousness. Was able to get herself up and take a shower.   No fever, chills, body aches or night sweats.  No nausea, vomiting.  ROS per HPI      Past Medical History:  Diagnosis Date  . Cancer ([place])    [facility]-  MELANOMA REMOVED FROM BACK   . Depression   . Hypercholesterolemia   . Hypertension     Patient Active Problem List   Diagnosis Date Noted  . H/O vitamin D deficiency 01/21/2015  . Osteopenia 01/21/2015  . Vaginal atrophy 01/21/2015  . Mixed hyperlipidemia 02/04/2014  . Family history of ovarian cancer 11/13/2012  . Rectocele 11/07/2012  . Other and unspecified hyperlipidemia 09/29/2012  . HYPERTENSION 08/07/2009  . ALLERGIC RHINITIS 08/07/2009  . HYPERSOMNIA WITH SLEEP APNEA UNSPECIFIED 08/04/2009    Past Surgical History:  Procedure Laterality Date  . ABDOMINAL HYSTERECTOMY     VAGINAL HYSTERECTOMY WITH OVARIAN PRESERVATION   . ABDOMINOPLASTY  1998  . APPENDECTOMY    . BREAST SURGERY  1985   BREAST REDUCTION   . TONSILLECTOMY AND ADENOIDECTOMY      OB History    Gravida  4   Para  4   Term      Preterm      AB      Living  4     SAB      TAB      Ectopic      Multiple      Live Births               Home Medications    Prior to Admission medications   Medication Sig Start Date End Date Taking? Authorizing Provider   acetaminophen (TYLENOL) 325 MG tablet Take 650 mg by mouth every 6 (six) hours as needed for pain.    [provider]  Calcium Carbonate-Vitamin D (CALCIUM + D PO) Take 1 tablet by mouth daily.    [provider]  cephALEXin (KEFLEX) 500 MG capsule Take 1 capsule (500 mg total) by mouth 2 (two) times daily for 7 days. 07/11/19 07/18/19  Loura Halt A, NP  cholecalciferol (VITAMIN D) 400 UNITS TABS tablet Take 5,000 Units by mouth.    [provider]  ezetimibe (ZETIA) 10 MG tablet TAKE 1 TABLET DAILY 05/07/19   Elayne Snare, MD  rosuvastatin (CRESTOR) 40 MG tablet TAKE 1 TABLET DAILY 05/07/19   Elayne Snare, MD  sertraline (ZOLOFT) 50 MG tablet Take 1 tablet (50 mg total) by mouth daily. 06/21/19   Elayne Snare, MD  traMADol (ULTRAM) 50 MG tablet Take 1 tablet (50 mg total) by mouth as needed. 06/21/19 09/19/19  Elayne Snare, MD  vitamin C (ASCORBIC ACID) 500 MG tablet Take 500 mg by mouth daily.    [provider]  Family History Family History  Problem Relation Age of Onset  . Hypertension Mother   . Heart disease Father   . Diabetes Father   . Cancer Sister 106       OVARIAN   . Thyroid disease Sister   . Heart disease Brother   . Thyroid disease Daughter   . Diabetes Paternal Grandmother     Social History Social History   Tobacco Use  . Smoking status: Never Smoker  . Smokeless tobacco: Never Used  Substance Use Topics  . Alcohol use: No    Alcohol/week: 0.0 standard drinks  . Drug use: Not on file     Allergies   Bee venom, Simvastatin, Codeine, Evista [raloxifene], and Percocet [oxycodone-acetaminophen]   Review of Systems Review of Systems   Physical Exam Triage Vital Signs ED Triage Vitals  Enc Vitals Group     BP 07/11/19 1142 131/62     Pulse Rate 07/11/19 1142 (!) 102     Resp 07/11/19 1142 14     Temp 07/11/19 1142 98.9 F (37.2 C)     Temp Source 07/11/19 1142 Oral     SpO2 07/11/19 1142 99 %     Weight --      Height  --      Head Circumference --      Peak Flow --      Pain Score 07/11/19 1145 7     Pain Loc --      Pain Edu? --      Excl. in [place]? --    No data found.  Updated Vital Signs BP 131/62 (BP Location: Right Arm)   Pulse (!) 102   Temp 98.9 F (37.2 C) (Oral)   Resp 14   SpO2 99%   Visual Acuity Right Eye Distance:   Left Eye Distance:   Bilateral Distance:    Right Eye Near:   Left Eye Near:    Bilateral Near:     Physical Exam Vitals and nursing note reviewed.  Constitutional:      General: She is not in acute distress.    Appearance: Normal appearance. She is not ill-appearing, toxic-appearing or diaphoretic.  HENT:     Head: Normocephalic.     Nose: Nose normal.  Eyes:     Conjunctiva/sclera: Conjunctivae normal.  Pulmonary:     Effort: Pulmonary effort is normal.  Abdominal:     General: Abdomen is flat.     Palpations: Abdomen is soft.     Tenderness: There is no abdominal tenderness. There is no right CVA tenderness or left CVA tenderness.     Comments: Generalized lower abdominal and lower back tenderness  Musculoskeletal:        General: Normal range of motion.     Cervical back: Normal range of motion.  Skin:    General: Skin is warm and dry.     Findings: No rash.  Neurological:     Mental Status: She is alert.  Psychiatric:        Mood and Affect: Mood normal.      UC Treatments / Results  Labs (all labs ordered are listed, but only abnormal results are displayed) Labs Reviewed  POCT URINALYSIS DIP (DEVICE) - Abnormal; Notable for the following components:      Result Value   Glucose, UA 100 (*)    Bilirubin Urine SMALL (*)    Ketones, ur TRACE (*)    Hgb urine dipstick SMALL (*)    Protein,  ur 30 (*)    Nitrite POSITIVE (*)    All other components within normal limits  URINE CULTURE    EKG   Radiology No results found.  Procedures Procedures (including critical care time)  Medications Ordered in UC Medications - No data to  display  Initial Impression / Assessment and Plan / UC Course  I have reviewed the triage vital signs and the nursing notes.  Pertinent labs & imaging results that were available during my care of the patient were reviewed by me and considered in my medical decision making (see chart for details).     Lower urinary tract infectious disease Urine with positive nitrates, small hemoglobin ketones, 100 glucose and small bilirubin. Treating for urinary tract infection with Keflex twice a day for 7 days. Recommended increase water intake due to mild dehydration This most likely cause her dizziness and fall last night. No concern for any head injury or injuries due to the fall at this time Follow up as needed for continued or worsening symptoms  Final Clinical Impressions(s) / UC Diagnoses   Final diagnoses:  Lower urinary tract infectious disease     Discharge Instructions     Treating you for a urinary tract infection Take the medication as prescribed. Drink plenty of fluids Follow up as needed for continued or worsening symptoms     ED Prescriptions    Medication Sig Dispense Auth. Provider   cephALEXin (KEFLEX) 500 MG capsule Take 1 capsule (500 mg total) by mouth 2 (two) times daily for 7 days. 14 capsule Aikeem Lilley A, NP     PDMP not reviewed this encounter.   Orvan July, NP 07/11/19 1227    Orvan July, NP 07/11/19 1228

## 2019-07-11 NOTE — Discharge Instructions (Addendum)
Treating you for a urinary tract infection Take the medication as prescribed. Drink plenty of fluids Follow up as needed for continued or worsening symptoms

## 2019-07-11 NOTE — ED Triage Notes (Signed)
Patient reports lower abdomen, dysuira and lower back pain. Patient also reports she fell last night. Patient took azo yesterday.

## 2019-07-12 LAB — URINE CULTURE: Culture: <10000 — AB

## 2019-07-26 DIAGNOSIS — H6123 Impacted cerumen, bilateral: Secondary | ICD-10-CM | POA: Diagnosis not present

## 2019-07-30 DIAGNOSIS — H903 Sensorineural hearing loss, bilateral: Secondary | ICD-10-CM | POA: Diagnosis not present

## 2019-09-04 ENCOUNTER — Other Ambulatory Visit: Payer: Self-pay | Admitting: Endocrinology

## 2019-09-07 ENCOUNTER — Encounter: Payer: Self-pay | Admitting: Family

## 2019-09-07 ENCOUNTER — Other Ambulatory Visit: Payer: Self-pay

## 2019-09-07 ENCOUNTER — Ambulatory Visit (INDEPENDENT_AMBULATORY_CARE_PROVIDER_SITE_OTHER): Payer: Medicare Other | Admitting: Family

## 2019-09-07 VITALS — BP 130/70 | HR 90 | Temp 98.0°F | Ht 66.0 in | Wt 144.0 lb

## 2019-09-07 DIAGNOSIS — M545 Low back pain, unspecified: Secondary | ICD-10-CM

## 2019-09-07 DIAGNOSIS — F32A Depression, unspecified: Secondary | ICD-10-CM

## 2019-09-07 DIAGNOSIS — E782 Mixed hyperlipidemia: Secondary | ICD-10-CM | POA: Diagnosis not present

## 2019-09-07 DIAGNOSIS — G8929 Other chronic pain: Secondary | ICD-10-CM | POA: Diagnosis not present

## 2019-09-07 DIAGNOSIS — R0600 Dyspnea, unspecified: Secondary | ICD-10-CM | POA: Diagnosis not present

## 2019-09-07 DIAGNOSIS — R7309 Other abnormal glucose: Secondary | ICD-10-CM | POA: Diagnosis not present

## 2019-09-07 DIAGNOSIS — F320 Major depressive disorder, single episode, mild: Secondary | ICD-10-CM | POA: Diagnosis not present

## 2019-09-07 DIAGNOSIS — R0609 Other forms of dyspnea: Secondary | ICD-10-CM

## 2019-09-07 MED ORDER — TRAMADOL HCL 50 MG PO TABS: 50.0000 mg | ORAL_TABLET | ORAL | 0 refills | Status: DC | PRN

## 2019-09-07 NOTE — Progress Notes (Signed)
Barbara Maxwell is a 84 y.o. female with the following history as recorded in [employer]:  Patient Active Problem List   Diagnosis Date Noted  . H/O vitamin D deficiency 01/21/2015  . Osteopenia 01/21/2015  . Vaginal atrophy 01/21/2015  . Mixed hyperlipidemia 02/04/2014  . Family history of ovarian cancer 11/13/2012  . Rectocele 11/07/2012  . Other and unspecified hyperlipidemia 09/29/2012  . HYPERTENSION 08/07/2009  . ALLERGIC RHINITIS 08/07/2009  . HYPERSOMNIA WITH SLEEP APNEA UNSPECIFIED 08/04/2009    Current Outpatient Medications  Medication Sig Dispense Refill  . acetaminophen (TYLENOL) 325 MG tablet Take 650 mg by mouth every 6 (six) hours as needed for pain.    . Calcium Carbonate-Vitamin D (CALCIUM + D PO) Take 1 tablet by mouth daily.    . cholecalciferol (VITAMIN D) 400 UNITS TABS tablet Take 5,000 Units by mouth.    . ezetimibe (ZETIA) 10 MG tablet TAKE 1 TABLET DAILY 90 tablet 3  . rosuvastatin (CRESTOR) 40 MG tablet TAKE 1 TABLET DAILY 90 tablet 3  . sertraline (ZOLOFT) 50 MG tablet TAKE 1 TABLET DAILY (NEW DOSE) 90 tablet 1  . traMADol (ULTRAM) 50 MG tablet Take 1 tablet (50 mg total) by mouth as needed. 30 tablet 0  . vitamin C (ASCORBIC ACID) 500 MG tablet Take 500 mg by mouth daily.     No current facility-administered medications for this visit.    Allergies: Bee venom, Simvastatin, Codeine, Evista [raloxifene], and Percocet [oxycodone-acetaminophen]  Past Medical History:  Diagnosis Date  . Cancer ([place])    [facility]-  MELANOMA REMOVED FROM BACK   . Depression   . Hypercholesterolemia   . Hypertension     Past Surgical History:  Procedure Laterality Date  . ABDOMINAL HYSTERECTOMY     VAGINAL HYSTERECTOMY WITH OVARIAN PRESERVATION   . ABDOMINOPLASTY  1998  . APPENDECTOMY    . BREAST SURGERY  1985   BREAST REDUCTION   . TONSILLECTOMY AND ADENOIDECTOMY      Family History  Problem Relation Age of Onset  . Hypertension Mother   . Heart disease Father    . Diabetes Father   . Cancer Sister 25       OVARIAN   . Thyroid disease Sister   . Heart disease Brother   . Thyroid disease Daughter   . Diabetes Paternal Grandmother     Social History   Tobacco Use  . Smoking status: Never Smoker  . Smokeless tobacco: Never Used  Substance Use Topics  . Alcohol use: No    Alcohol/week: 0.0 standard drinks    Subjective:  Patient presents today as a new patient; her PCP will be transitioning to endocrinology only; in baseline state of health today; has already had her CPE with GYN and Dr. Dwyane Dee this year;  History of hyperlipidemia/ pre-diabetes; Melanoma removed in 2000- sees dermatology regularly; Has had a difficult year- lost her husband, sister and daughter; does feel she is coping well/ Zoloft is helping.  Does mention that occasionally she feels very winded with exertion and has had some chest pain on times;     Objective:  Vitals:   09/07/19 0907  BP: 130/70  Pulse: 90  Temp: 98 F (36.7 C)  TempSrc: Oral  SpO2: 96%  Weight: 144 lb (65.3 kg)  Height: 5\' 6"  (1.676 m)    General: Well developed, well nourished, in no acute distress  Skin : Warm and dry.  Head: Normocephalic and atraumatic  Lungs: Respirations unlabored; clear to auscultation  bilaterally without wheeze, rales, rhonchi  CVS exam: normal rate and regular rhythm.  Musculoskeletal: No deformities; no active joint inflammation  Extremities: No edema, cyanosis, clubbing  Vessels: Symmetric bilaterally  Neurologic: Alert and oriented; speech intact; face symmetrical; moves all extremities well; CNII-XII intact without focal deficit   Assessment:  1. Elevated glucose   2. DOE (dyspnea on exertion)   3. Mild depression ([place])   4. Mixed hyperlipidemia   5. Chronic low back pain without sciatica, unspecified back pain laterality     Plan:  1. Update BMP, Hgba1c today; 2. Refer to cardiology for further evaluation; 3. Stable on Zoloft at 50 mg daily; 4. Stable  on current medications; 5. Refill given on Tramadol- able to review old records to see that this has been used chronically with no concerns for abuse;  Time spent with patient today 30 minutes reviewing records and addressing health concerns  This visit occurred during the SARS-CoV-2 public health emergency.  Safety protocols were in place, including screening questions prior to the visit, additional usage of staff PPE, and extensive cleaning of exam room while observing appropriate contact time as indicated for disinfecting solutions.     No follow-ups on file.  Orders Placed This Encounter  Procedures  . Basic Metabolic Panel (BMET)    Standing Status:   Future    Number of Occurrences:   1    Standing Expiration Date:   09/06/2020  . HgB A1c    Standing Status:   Future    Number of Occurrences:   1    Standing Expiration Date:   09/06/2020  . Ambulatory referral to Cardiology    Referral Priority:   Routine    Referral Type:   Consultation    Referral Reason:   Specialty Services Required    Requested Specialty:   Cardiology    Number of Visits Requested:   1    Requested Prescriptions   Signed Prescriptions Disp Refills  . traMADol (ULTRAM) 50 MG tablet 30 tablet 0    Sig: Take 1 tablet (50 mg total) by mouth as needed.

## 2019-09-08 LAB — HEMOGLOBIN A1C
Hgb A1c MFr Bld: 6 % of total Hgb — ABNORMAL HIGH (ref <5.7)
Mean Plasma Glucose: 126 (calc)
eAG (mmol/L): 7 (calc)

## 2019-09-08 LAB — BASIC METABOLIC PANEL
BUN: 15 mg/dL (ref 7–25)
CO2: 25 mmol/L (ref 20–32)
Calcium: 9.6 mg/dL (ref 8.6–10.4)
Chloride: 108 mmol/L (ref 98–110)
Creat: 0.77 mg/dL (ref 0.60–0.88)
Glucose, Bld: 83 mg/dL (ref 65–99)
Potassium: 4 mmol/L (ref 3.5–5.3)
Sodium: 141 mmol/L (ref 135–146)

## 2019-09-11 ENCOUNTER — Other Ambulatory Visit: Payer: Self-pay | Admitting: Family

## 2019-09-11 MED ORDER — TRAMADOL HCL 50 MG PO TABS: 50.0000 mg | ORAL_TABLET | ORAL | 0 refills | Status: AC | PRN

## 2019-09-28 ENCOUNTER — Ambulatory Visit (INDEPENDENT_AMBULATORY_CARE_PROVIDER_SITE_OTHER): Payer: Medicare Other | Admitting: Cardiovascular Disease

## 2019-09-28 ENCOUNTER — Other Ambulatory Visit: Payer: Self-pay

## 2019-09-28 ENCOUNTER — Encounter: Payer: Self-pay | Admitting: Cardiovascular Disease

## 2019-09-28 DIAGNOSIS — R0600 Dyspnea, unspecified: Secondary | ICD-10-CM | POA: Diagnosis not present

## 2019-09-28 DIAGNOSIS — R0609 Other forms of dyspnea: Secondary | ICD-10-CM

## 2019-09-28 DIAGNOSIS — R0789 Other chest pain: Secondary | ICD-10-CM | POA: Insufficient documentation

## 2019-09-28 DIAGNOSIS — E782 Mixed hyperlipidemia: Secondary | ICD-10-CM | POA: Diagnosis not present

## 2019-09-28 NOTE — Assessment & Plan Note (Signed)
Recent onset atypical chest pain that occurs with exertion.  She has no cardiac risk factors other than hyperlipidemia.  We will get a coronary calcium score to further evaluate

## 2019-09-28 NOTE — Assessment & Plan Note (Signed)
History of hyperlipidemia on statin therapy with lipid profile performed 06/18/2019 revealing total cholesterol 148, LDL 79 and HDL 49.

## 2019-09-28 NOTE — Patient Instructions (Signed)
Medication Instructions:  Your physician recommends that you continue on your current medications as directed. Please refer to the Current Medication list given to you today.  *If you need a refill on your cardiac medications before your next appointment, please call your pharmacy*   Testing/Procedures: [address]  Your physician has requested that you have an echocardiogram. Echocardiography is a painless test that uses sound waves to create images of your heart. It provides your doctor with information about the size and shape of your heart and how well your heart's chambers and valves are working. This procedure takes approximately one hour. There are no restrictions for this procedure.  Dr. Gwenlyn Found has ordered a CT coronary calcium score. This is $150 out of pocket.   Coronary CalciumScan A coronary calcium scan is an imaging test used to look for deposits of calcium and other fatty materials (plaques) in the inner lining of the blood vessels of the heart (coronary arteries). These deposits of calcium and plaques can partly clog and narrow the coronary arteries without producing any symptoms or warning signs. This puts a person at risk for a heart attack. This test can detect these deposits before symptoms develop. Tell a health care provider about:  Any allergies you have.  All medicines you are taking, including vitamins, herbs, eye drops, creams, and over-the-counter medicines.  Any problems you or family members have had with anesthetic medicines.  Any blood disorders you have.  Any surgeries you have had.  Any medical conditions you have.  Whether you are pregnant or may be pregnant. What are the risks? Generally, this is a safe procedure. However, problems may occur, including:  Harm to a pregnant woman and her unborn baby. This test involves the use of radiation. Radiation exposure can be dangerous to a pregnant woman and her unborn baby. If you are  pregnant, you generally should not have this procedure done.  Slight increase in the risk of cancer. This is because of the radiation involved in the test. What happens before the procedure? No preparation is needed for this procedure. What happens during the procedure?  You will undress and remove any jewelry around your neck or chest.  You will put on a hospital gown.  Sticky electrodes will be placed on your chest. The electrodes will be connected to an electrocardiogram (ECG) machine to record a tracing of the electrical activity of your heart.  A CT scanner will take pictures of your heart. During this time, you will be asked to lie still and hold your breath for 2-3 seconds while a picture of your heart is being taken. The procedure may vary among health care providers and hospitals. What happens after the procedure?  You can get dressed.  You can return to your normal activities.  It is up to you to get the results of your test. Ask your health care provider, or the department that is doing the test, when your results will be ready. Summary  A coronary calcium scan is an imaging test used to look for deposits of calcium and other fatty materials (plaques) in the inner lining of the blood vessels of the heart (coronary arteries).  Generally, this is a safe procedure. Tell your health care provider if you are pregnant or may be pregnant.  No preparation is needed for this procedure.  A CT scanner will take pictures of your heart.  You can return to your normal activities after the scan is done. This  information is not intended to replace advice given to you by your health care provider. Make sure you discuss any questions you have with your health care provider. Document Released: 08/07/2007 Document Revised: 12/29/2015 Document Reviewed: 12/29/2015 Elsevier Interactive Patient Education  2017 [place]: At [facility], you and your health needs are  our priority.  As part of our continuing mission to provide you with exceptional heart care, we have created designated Provider Care Teams.  These Care Teams include your primary Cardiologist (physician) and Advanced Practice Providers (APPs -  Physician Assistants and Nurse Practitioners) who all work together to provide you with the care you need, when you need it.  We recommend signing up for the patient portal called "MyChart".  Sign up information is provided on this After Visit Summary.  MyChart is used to connect with patients for Virtual Visits (Telemedicine).  Patients are able to view lab/test results, encounter notes, upcoming appointments, etc.  Non-urgent messages can be sent to your provider as well.   To learn more about what you can do with MyChart, go to [email or web address].    Your next appointment:   AS NEEDED with Dr. Gwenlyn Found

## 2019-09-28 NOTE — Assessment & Plan Note (Signed)
Ms. Volpe is noticed fairly recent onset of dyspnea on exertion when walking up an incline.  We will get a 2D echo to further evaluate

## 2019-09-28 NOTE — Progress Notes (Signed)
09/28/2019 Barbara Maxwell   23-Sep-1935  [id number]  Primary Physician [name], [place] Primary Cardiologist: Lorretta Harp MD Lupe Carney, [place]  HPI:  Barbara Maxwell is a 84 y.o. mildly overweight widowed Caucasian female (husband of 47 years recently passed away), mother of 3 living children (daughter 56 committed suicide), grandmother 13 grandchildren referred by Dr. Valere Dross for cardiovascular valuation because of atypical chest pain and dyspnea.  Risk factors are only notable for hyperlipidemia treated.  Her father apparently did have bypass surgery and she had a son who had a stent.  She is never had a heart attack or stroke.  She is fairly active and does water aerobics 2 days/week.  She is noticed some dyspnea walking up an incline recently and some atypical chest pain.  She does admit to anxiety since she is living alone currently.   Current Meds  Medication Sig  . acetaminophen (TYLENOL) 325 MG tablet Take 650 mg by mouth every 6 (six) hours as needed for pain.  . Calcium Carbonate-Vitamin D (CALCIUM + D PO) Take 1 tablet by mouth daily.  . cholecalciferol (VITAMIN D) 400 UNITS TABS tablet Take 5,000 Units by mouth.  . ezetimibe (ZETIA) 10 MG tablet TAKE 1 TABLET DAILY  . rosuvastatin (CRESTOR) 40 MG tablet TAKE 1 TABLET DAILY  . sertraline (ZOLOFT) 50 MG tablet TAKE 1 TABLET DAILY (NEW DOSE)  . traMADol (ULTRAM) 50 MG tablet Take 1 tablet (50 mg total) by mouth as needed.  . vitamin C (ASCORBIC ACID) 500 MG tablet Take 500 mg by mouth daily.     Allergies  Allergen Reactions  . Bee Venom Anaphylaxis  . Simvastatin     Myopathy  . Codeine Nausea Only  . Evista [Raloxifene]     Hot flashes  . Percocet [Oxycodone-Acetaminophen] Nausea And Vomiting    Nausea and vomiting    Social History   Socioeconomic History  . Marital status: Widowed    Spouse name: Not on file  . Number of children: Not on file  . Years of education: Not on file  . Highest  education level: Not on file  Occupational History  . Not on file  Tobacco Use  . Smoking status: Never Smoker  . Smokeless tobacco: Never Used  Vaping Use  . Vaping Use: Never used  Substance and Sexual Activity  . Alcohol use: No    Alcohol/week: 0.0 standard drinks  . Drug use: Not on file  . Sexual activity: Not Currently    Comment: 1st intercourse- 20, partners- 2, widow  Other Topics Concern  . Not on file  Social History Narrative  . Not on file   Social Determinants of Health   Financial Resource Strain:   . Difficulty of Paying Living Expenses:   Food Insecurity:   . Worried About Charity fundraiser in the Last Year:   . Arboriculturist in the Last Year:   Transportation Needs:   . Film/video editor (Medical):   [name] Lack of Transportation (Non-Medical):   Physical Activity:   . Days of Exercise per Week:   . Minutes of Exercise per Session:   Stress:   . Feeling of Stress :   Social Connections:   . Frequency of Communication with Friends and Family:   . Frequency of Social Gatherings with Friends and Family:   . Attends Religious Services:   . Active Member of Clubs or Organizations:   . Attends  Club or Organization Meetings:   [name] Marital Status:   Intimate Partner Violence:   . Fear of Current or Ex-Partner:   . Emotionally Abused:   [name] Physically Abused:   . Sexually Abused:      Review of Systems: General: negative for chills, fever, night sweats or weight changes.  Cardiovascular: negative for chest pain, dyspnea on exertion, edema, orthopnea, palpitations, paroxysmal nocturnal dyspnea or shortness of breath Dermatological: negative for rash Respiratory: negative for cough or wheezing Urologic: negative for hematuria Abdominal: negative for nausea, vomiting, diarrhea, bright red blood per rectum, melena, or hematemesis Neurologic: negative for visual changes, syncope, or dizziness All other systems reviewed and are otherwise negative except as  noted above.    Blood pressure 126/68, pulse 65, height 5\' 3"  (1.6 m), weight 147 lb (66.7 kg), SpO2 98 %.  General appearance: alert and no distress Neck: no adenopathy, no carotid bruit, no JVD, supple, symmetrical, trachea midline and thyroid not enlarged, symmetric, no tenderness/mass/nodules Lungs: clear to auscultation bilaterally Heart: regular rate and rhythm, S1, S2 normal, no murmur, click, rub or gallop Extremities: extremities normal, atraumatic, no cyanosis or edema Pulses: 2+ and symmetric Skin: Skin color, texture, turgor normal. No rashes or lesions Neurologic: Alert and oriented X 3, normal strength and tone. Normal symmetric reflexes. Normal coordination and gait  EKG sinus rhythm at 65 with nonspecific ST and T wave changes.  I personally reviewed this EKG.  ASSESSMENT AND PLAN:   Dyspnea on exertion Barbara Maxwell is noticed fairly recent onset of dyspnea on exertion when walking up an incline.  We will get a 2D echo to further evaluate  Atypical chest pain Recent onset atypical chest pain that occurs with exertion.  She has no cardiac risk factors other than hyperlipidemia.  We will get a coronary calcium score to further evaluate  Mixed hyperlipidemia History of hyperlipidemia on statin therapy with lipid profile performed 06/18/2019 revealing total cholesterol 148, LDL 79 and HDL 49.      Lorretta Harp MD FACP,FACC,FAHA, [facility] 09/28/2019 9:37 AM

## 2019-10-10 ENCOUNTER — Encounter: Payer: Self-pay | Admitting: Obstetrics & Gynecology

## 2019-10-10 DIAGNOSIS — Z1231 Encounter for screening mammogram for malignant neoplasm of breast: Secondary | ICD-10-CM | POA: Diagnosis not present

## 2019-10-15 ENCOUNTER — Ambulatory Visit (HOSPITAL_COMMUNITY): Payer: Medicare Other | Attending: Cardiology

## 2019-10-15 ENCOUNTER — Ambulatory Visit
Admission: RE | Admit: 2019-10-15 | Discharge: 2019-10-15 | Disposition: A | Discharge status: Home / Self Care | Payer: Self-pay | Source: Ambulatory Visit | Attending: Cardiovascular Disease | Admitting: Cardiovascular Disease

## 2019-10-15 ENCOUNTER — Other Ambulatory Visit: Payer: Self-pay

## 2019-10-15 DIAGNOSIS — R0600 Dyspnea, unspecified: Secondary | ICD-10-CM

## 2019-10-15 DIAGNOSIS — R0789 Other chest pain: Secondary | ICD-10-CM

## 2019-10-15 DIAGNOSIS — R0609 Other forms of dyspnea: Secondary | ICD-10-CM

## 2019-10-15 DIAGNOSIS — I700 Atherosclerosis of aorta: Secondary | ICD-10-CM | POA: Diagnosis not present

## 2019-10-15 LAB — ECHOCARDIOGRAM COMPLETE
Area-P 1/2: 4.41 cm2
S' Lateral: 2.9 cm

## 2019-10-16 ENCOUNTER — Encounter: Payer: Self-pay | Admitting: Obstetrics & Gynecology

## 2019-10-16 ENCOUNTER — Telehealth: Payer: Self-pay | Admitting: Cardiovascular Disease

## 2019-10-16 DIAGNOSIS — R922 Inconclusive mammogram: Secondary | ICD-10-CM | POA: Diagnosis not present

## 2019-10-16 NOTE — Telephone Encounter (Signed)
Spoke to patient this morning lab results given.

## 2019-10-16 NOTE — Telephone Encounter (Signed)
Patient returned call regarding her results

## 2019-10-22 ENCOUNTER — Other Ambulatory Visit: Payer: Self-pay

## 2019-10-22 ENCOUNTER — Telehealth: Payer: Self-pay

## 2019-10-22 DIAGNOSIS — R931 Abnormal findings on diagnostic imaging of heart and coronary circulation: Secondary | ICD-10-CM

## 2019-10-22 DIAGNOSIS — Z01812 Encounter for preprocedural laboratory examination: Secondary | ICD-10-CM

## 2019-10-22 DIAGNOSIS — R072 Precordial pain: Secondary | ICD-10-CM

## 2019-10-22 MED ORDER — METOPROLOL TARTRATE 50 MG PO TABS
ORAL_TABLET | ORAL | 0 refills | Status: DC
Start: 2019-10-22 — End: 2019-11-20

## 2019-10-22 NOTE — Telephone Encounter (Signed)
Your cardiac CT will be scheduled at one of the below locations:   [facility] [address] [place], [place] [address] [phone]  [place] [address] [place], [place] [address] [phone]  If scheduled at [facility], please arrive at the [facility] main entrance of [facility] 30 minutes prior to test start time. Proceed to the [facility] Radiology Department (first floor) to check-in and test prep.  If scheduled at [facility], please arrive 15 mins early for check-in and test prep.  Please follow these instructions carefully (unless otherwise directed):    On the Night Before the Test:  Be sure to Drink plenty of water.  Do not consume any caffeinated/decaffeinated beverages or chocolate 12 hours prior to your test.  Do not take any antihistamines 12 hours prior to your test.  On the Day of the Test:  Drink plenty of water. Do not drink any water within one hour of the test.  Do not eat any food 4 hours prior to the test.  You may take your regular medications prior to the test.   Take metoprolol 50 mg two hours prior to test.  FEMALES- please wear underwire-free bra if available         After the Test:  Drink plenty of water.  After receiving IV contrast, you may experience a mild flushed feeling. This is normal.  On occasion, you may experience a mild rash up to 24 hours after the test. This is not dangerous. If this occurs, you can take Benadryl 25 mg and increase your fluid intake.  If you experience trouble breathing, this can be serious. If it is severe call 911 IMMEDIATELY. If it is mild, please call our office.    Once we have confirmed authorization from your insurance company, we will call you to set up a date and time for your test. Based on how quickly your insurance processes prior authorizations requests, please allow up to  4 weeks to be contacted for scheduling your Cardiac CT appointment. Be advised that routine Cardiac CT appointments could be scheduled as many as 8 weeks after your provider has ordered it.  For non-scheduling related questions, please contact the cardiac imaging nurse navigator should you have any questions/concerns: Marchia Bond, Cardiac Imaging Nurse Navigator Burley Saver, Interim Cardiac Imaging Nurse [place] [facility] Direct Office Dial: [phone]   For scheduling needs, including cancellations and rescheduling, please call Vivien Rota at [phone], option 3.

## 2019-10-22 NOTE — Progress Notes (Signed)
coronary

## 2019-10-23 ENCOUNTER — Other Ambulatory Visit: Payer: Self-pay | Admitting: Cardiovascular Disease

## 2019-10-30 DIAGNOSIS — R931 Abnormal findings on diagnostic imaging of heart and coronary circulation: Secondary | ICD-10-CM | POA: Diagnosis not present

## 2019-10-30 DIAGNOSIS — Z01812 Encounter for preprocedural laboratory examination: Secondary | ICD-10-CM | POA: Diagnosis not present

## 2019-10-30 LAB — BASIC METABOLIC PANEL
BUN/Creatinine Ratio: 15 (ref 12–28)
BUN: 13 mg/dL (ref 8–27)
CO2: 23 mmol/L (ref 20–29)
Calcium: 9.7 mg/dL (ref 8.7–10.3)
Chloride: 103 mmol/L (ref 96–106)
Creatinine, Ser: 0.84 mg/dL (ref 0.57–1.00)
GFR calc Af Amer: 75 mL/min/{1.73_m2} (ref >59)
GFR calc non Af Amer: 65 mL/min/{1.73_m2} (ref >59)
Glucose: 72 mg/dL (ref 65–99)
Potassium: 4.8 mmol/L (ref 3.5–5.2)
Sodium: 141 mmol/L (ref 134–144)

## 2019-11-06 ENCOUNTER — Telehealth (HOSPITAL_COMMUNITY): Payer: Self-pay | Admitting: *Deleted

## 2019-11-06 ENCOUNTER — Telehealth (HOSPITAL_COMMUNITY): Payer: Self-pay | Admitting: Emergency Medicine

## 2019-11-06 NOTE — Telephone Encounter (Signed)
Patient returning call regarding upcoming cardiac imaging study; pt verbalizes understanding of appt date/time, parking situation and where to check in, pre-test NPO status and medications ordered, and verified current allergies; name and call back number provided for further questions should they arise ° °Takiya Belmares Tai RN Navigator Cardiac Imaging °[facility] °[phone] office °[phone] cell ° °

## 2019-11-06 NOTE — Telephone Encounter (Signed)
Attempted to call patient regarding upcoming cardiac CT appointment. °Left message on voicemail with name and callback number °Xariah Silvernail RN Navigator Cardiac Imaging °[facility] °[phone] Office °[phone] Cell ° °

## 2019-11-07 ENCOUNTER — Encounter (HOSPITAL_COMMUNITY): Payer: Self-pay

## 2019-11-07 ENCOUNTER — Ambulatory Visit (HOSPITAL_COMMUNITY)
Admission: RE | Admit: 2019-11-07 | Discharge: 2019-11-07 | Disposition: A | Discharge status: Home / Self Care | Payer: Medicare Other | Source: Ambulatory Visit | Attending: Cardiovascular Disease | Admitting: Cardiovascular Disease

## 2019-11-07 ENCOUNTER — Other Ambulatory Visit: Payer: Self-pay

## 2019-11-07 DIAGNOSIS — I2510 Atherosclerotic heart disease of native coronary artery without angina pectoris: Secondary | ICD-10-CM | POA: Diagnosis not present

## 2019-11-07 DIAGNOSIS — R072 Precordial pain: Secondary | ICD-10-CM | POA: Insufficient documentation

## 2019-11-07 MED ORDER — NITROGLYCERIN 0.4 MG SL SUBL
0.8000 mg | SUBLINGUAL_TABLET | Freq: Once | SUBLINGUAL | Status: AC
  Administered 2019-11-07: 0.8 mg via SUBLINGUAL

## 2019-11-07 MED ORDER — NITROGLYCERIN 0.4 MG SL SUBL
SUBLINGUAL_TABLET | SUBLINGUAL | Status: AC
  Filled 2019-11-07: qty 2

## 2019-11-07 MED ORDER — IOHEXOL 350 MG/ML SOLN
80.0000 mL | Freq: Once | INTRAVENOUS | Status: AC | PRN
  Administered 2019-11-07: 80 mL via INTRAVENOUS

## 2019-11-07 NOTE — Progress Notes (Signed)
Patient tolerated CT well. Stated did get a headache 3/10 achy pain. Patient drank water and ate cookies after. Headache improved 1/10 achy. Ambulated to exit steady gait.

## 2019-11-09 DIAGNOSIS — R072 Precordial pain: Secondary | ICD-10-CM | POA: Diagnosis not present

## 2019-11-09 DIAGNOSIS — Z6826 Body mass index (BMI) 26.0-26.9, adult: Secondary | ICD-10-CM | POA: Diagnosis not present

## 2019-11-09 DIAGNOSIS — I2510 Atherosclerotic heart disease of native coronary artery without angina pectoris: Secondary | ICD-10-CM | POA: Diagnosis not present

## 2019-11-09 DIAGNOSIS — R931 Abnormal findings on diagnostic imaging of heart and coronary circulation: Secondary | ICD-10-CM | POA: Diagnosis not present

## 2019-11-09 DIAGNOSIS — R079 Chest pain, unspecified: Secondary | ICD-10-CM | POA: Diagnosis not present

## 2019-11-09 DIAGNOSIS — R0600 Dyspnea, unspecified: Secondary | ICD-10-CM | POA: Diagnosis not present

## 2019-11-13 ENCOUNTER — Ambulatory Visit (INDEPENDENT_AMBULATORY_CARE_PROVIDER_SITE_OTHER): Payer: Medicare Other | Admitting: Family

## 2019-11-13 ENCOUNTER — Encounter: Payer: Self-pay | Admitting: Family

## 2019-11-13 ENCOUNTER — Ambulatory Visit (INDEPENDENT_AMBULATORY_CARE_PROVIDER_SITE_OTHER): Payer: Medicare Other

## 2019-11-13 ENCOUNTER — Other Ambulatory Visit: Payer: Self-pay

## 2019-11-13 ENCOUNTER — Ambulatory Visit: Payer: Medicare Other

## 2019-11-13 VITALS — BP 140/72 | HR 73 | Temp 98.6°F | Ht 63.0 in | Wt 143.0 lb

## 2019-11-13 DIAGNOSIS — R0781 Pleurodynia: Secondary | ICD-10-CM

## 2019-11-13 DIAGNOSIS — S2232XA Fracture of one rib, left side, initial encounter for closed fracture: Secondary | ICD-10-CM | POA: Diagnosis not present

## 2019-11-13 DIAGNOSIS — R931 Abnormal findings on diagnostic imaging of heart and coronary circulation: Secondary | ICD-10-CM

## 2019-11-13 NOTE — Progress Notes (Signed)
Barbara Maxwell is a 84 y.o. female with the following history as recorded in EpicCare:  Patient Active Problem List   Diagnosis Date Noted  . Atypical chest pain 09/28/2019  . Dyspnea on exertion 09/28/2019  . H/O vitamin D deficiency 01/21/2015  . Osteopenia 01/21/2015  . Vaginal atrophy 01/21/2015  . Mixed hyperlipidemia 02/04/2014  . Family history of ovarian cancer 11/13/2012  . Rectocele 11/07/2012  . Hyperlipidemia 09/29/2012  . HYPERTENSION 08/07/2009  . ALLERGIC RHINITIS 08/07/2009  . HYPERSOMNIA WITH SLEEP APNEA UNSPECIFIED 08/04/2009    Current Outpatient Medications  Medication Sig Dispense Refill  . acetaminophen (TYLENOL) 325 MG tablet Take 650 mg by mouth every 6 (six) hours as needed for pain.    . Calcium Carbonate-Vitamin D (CALCIUM + D PO) Take 1 tablet by mouth daily.    . cholecalciferol (VITAMIN D) 400 UNITS TABS tablet Take 5,000 Units by mouth.    . ezetimibe (ZETIA) 10 MG tablet TAKE 1 TABLET DAILY 90 tablet 3  . metoprolol tartrate (LOPRESSOR) 50 MG tablet Take 50 mg 2 hours before coronary ct 1 tablet 0  . rosuvastatin (CRESTOR) 40 MG tablet TAKE 1 TABLET DAILY 90 tablet 3  . sertraline (ZOLOFT) 50 MG tablet TAKE 1 TABLET DAILY (NEW DOSE) 90 tablet 1  . traMADol (ULTRAM) 50 MG tablet Take 1 tablet (50 mg total) by mouth as needed. 30 tablet 0  . vitamin C (ASCORBIC ACID) 500 MG tablet Take 500 mg by mouth daily.     No current facility-administered medications for this visit.    Allergies: Bee venom, Simvastatin, Codeine, Evista [raloxifene], and Percocet [oxycodone-acetaminophen]  Past Medical History:  Diagnosis Date  . Cancer ([place])    [facility]-  MELANOMA REMOVED FROM BACK   . Depression   . Hypercholesterolemia   . Hypertension     Past Surgical History:  Procedure Laterality Date  . ABDOMINAL HYSTERECTOMY     VAGINAL HYSTERECTOMY WITH OVARIAN PRESERVATION   . ABDOMINOPLASTY  1998  . APPENDECTOMY    . BREAST SURGERY  1985   BREAST  REDUCTION   . TONSILLECTOMY AND ADENOIDECTOMY      Family History  Problem Relation Age of Onset  . Hypertension Mother   . Heart disease Father   . Diabetes Father   . Cancer Sister 36       OVARIAN   . Thyroid disease Sister   . Heart disease Brother   . Thyroid disease Daughter   . Diabetes Paternal Grandmother     Social History   Tobacco Use  . Smoking status: Never Smoker  . Smokeless tobacco: Never Used  Substance Use Topics  . Alcohol use: No    Alcohol/week: 0.0 standard drinks    Subjective:  Patient notes that she fell at her home approximately 2 weeks ago- "foot caught on the sidewalk"; landed on her left side; no shortness of breath or coughing up blood; Notes that symptoms are improving;   Also wants to know if results of recent cardiac CT is back; her calcium CT score was 1681 so further testing was done last week and she is anxious about those results;    Objective:  Vitals:   11/13/19 0922  BP: 140/72  Pulse: 73  Temp: 98.6 F (37 C)  TempSrc: Oral  SpO2: 98%  Weight: 143 lb (64.9 kg)  Height: 5\' 3"  (1.6 m)    General: Well developed, well nourished, in no acute distress  Head: Normocephalic and atraumatic  Lungs: Respirations unlabored; clear to auscultation bilaterally without wheeze, rales, rhonchi  CVS exam: normal rate and regular rhythm.  Neurologic: Alert and oriented; speech intact; face symmetrical; moves all extremities well; CNII-XII intact without focal deficit   Assessment:  1. Rib pain on left side   2. High coronary artery calcium score     Plan:  1. Physical exam is reassuring; update CXR and rib X-ray;  2. Reviewed recent test results with her- she understands her cardiologist wants her seen in the office for follow-up and will most likely need to discuss having a stent placed. She will stay on her cholesterol medication and okay to take Baby ASA for now.   Time spent 30 minutes This visit occurred during the SARS-CoV-2 public  health emergency.  Safety protocols were in place, including screening questions prior to the visit, additional usage of staff PPE, and extensive cleaning of exam room while observing appropriate contact time as indicated for disinfecting solutions.     No follow-ups on file.  Orders Placed This Encounter  Procedures  . DG Ribs Unilateral Left    Standing Status:   Future    Number of Occurrences:   1    Standing Expiration Date:   11/12/2020    Order Specific Question:   Reason for Exam (SYMPTOM  OR DIAGNOSIS REQUIRED)    Answer:   left rib pain    Order Specific Question:   Preferred imaging location?    Answer:   [facility]    Order Specific Question:   Radiology Contrast Protocol - do NOT remove file path    Answer:   [email or web address]  . DG Chest 2 View    Standing Status:   Future    Number of Occurrences:   1    Standing Expiration Date:   11/12/2020    Order Specific Question:   Reason for Exam (SYMPTOM  OR DIAGNOSIS REQUIRED)    Answer:   pain s/p fall; left rib pain    Order Specific Question:   Preferred imaging location?    Answer:   [facility]    Order Specific Question:   Radiology Contrast Protocol - do NOT remove file path    Answer:   [email or web address]    Requested Prescriptions    No prescriptions requested or ordered in this encounter

## 2019-11-15 ENCOUNTER — Telehealth: Payer: Self-pay | Admitting: Cardiovascular Disease

## 2019-11-15 NOTE — Telephone Encounter (Signed)
New message:     Patient calling to get the results of her test she took a week ago. Please call patient.

## 2019-11-15 NOTE — Telephone Encounter (Signed)
Return office visit with me to discuss results at next available   PT [place] 11/16/19 AT 10:30 AM WITH DR BERRY./CY

## 2019-11-16 ENCOUNTER — Ambulatory Visit (INDEPENDENT_AMBULATORY_CARE_PROVIDER_SITE_OTHER): Payer: Medicare Other | Admitting: Cardiovascular Disease

## 2019-11-16 ENCOUNTER — Encounter: Payer: Self-pay | Admitting: Cardiovascular Disease

## 2019-11-16 ENCOUNTER — Other Ambulatory Visit (HOSPITAL_COMMUNITY)
Admission: RE | Admit: 2019-11-16 | Discharge: 2019-11-16 | Disposition: A | Discharge status: Home / Self Care | Payer: Medicare Other | Source: Ambulatory Visit | Attending: Cardiovascular Disease | Admitting: Cardiovascular Disease

## 2019-11-16 ENCOUNTER — Other Ambulatory Visit: Payer: Self-pay | Admitting: *Deleted

## 2019-11-16 ENCOUNTER — Other Ambulatory Visit: Payer: Self-pay

## 2019-11-16 DIAGNOSIS — R931 Abnormal findings on diagnostic imaging of heart and coronary circulation: Secondary | ICD-10-CM

## 2019-11-16 DIAGNOSIS — Z20822 Contact with and (suspected) exposure to covid-19: Secondary | ICD-10-CM | POA: Diagnosis not present

## 2019-11-16 DIAGNOSIS — R0789 Other chest pain: Secondary | ICD-10-CM

## 2019-11-16 DIAGNOSIS — Z01812 Encounter for preprocedural laboratory examination: Secondary | ICD-10-CM | POA: Diagnosis not present

## 2019-11-16 DIAGNOSIS — R072 Precordial pain: Secondary | ICD-10-CM

## 2019-11-16 LAB — SARS CORONAVIRUS 2 (TAT 6-24 HRS): SARS Coronavirus 2: NEGATIVE

## 2019-11-16 MED ORDER — SODIUM CHLORIDE 0.9% FLUSH: 3.0000 mL | Freq: Two times a day (BID) | INTRAVENOUS | Status: DC

## 2019-11-16 NOTE — Patient Instructions (Addendum)
Medication Instructions:  No Changes In Medications at this time.  *If you need a refill on your cardiac medications before your next appointment, please call your pharmacy*  Lab Work: [facility] and [place]  If you have labs (blood work) drawn today and your tests are completely normal, you will receive your results only by: [name] MyChart Message (if you have MyChart) OR . A paper copy in the mail If you have any lab test that is abnormal or we need to change your treatment, we will call you to review the results.  Testing/Procedures:   [place] [address] Dept: [phone] Loc: [phone]  KYNDLE SCHLENDER  11/16/2019  You are scheduled for a Cardiac Catheterization on Monday, September 27 with Dr. Quay Burow.  1. Please arrive at the [facility] (Main Entrance A) at [facility]: [address] [place], [place] [address] at 9:30 AM (This time is two hours before your procedure to ensure your preparation). Free valet parking service is available.   Special note: Every effort is made to have your procedure done on time. Please understand that emergencies sometimes delay scheduled procedures.  2. Diet: Do not eat solid foods after midnight.  The patient may have clear liquids until 5am upon the day of the procedure.  3. Labs: You will need to have blood drawn on Friday, September 24at [facility] [address], [place]  Open: 8am - 5pm (Lunch 12:30 - 1:30)   Phone: [phone]. You do not need to be fasting.  4. Medication instructions in preparation for your procedure:   Contrast Allergy: No  On the morning of your procedure, take your  any morning medicines NOT listed above.  You may use sips of water.  5. Plan for one night stay--bring personal belongings. 6. Bring a current list of your medications and current insurance cards. 7. You MUST  have a responsible person to drive you home. 8. Someone MUST be with you the first 24 hours after you arrive home or your discharge will be delayed. 9. Please wear clothes that are easy to get on and off and wear slip-on shoes.  Thank you for allowing [place] to care for you!   -- [facility]  Follow-Up: At [facility], you and your health needs are our priority.  As part of our continuing mission to provide you with exceptional heart care, we have created designated Provider Care Teams.  These Care Teams include your primary Cardiologist (physician) and Advanced Practice Providers (APPs -  Physician Assistants and Nurse Practitioners) who all work together to provide you with the care you need, when you need it.  We recommend signing up for the patient portal called "MyChart".  Sign up information is provided on this After Visit Summary.  MyChart is used to connect with patients for Virtual Visits (Telemedicine).  Patients are able to view lab/test results, encounter notes, upcoming appointments, etc.  Non-urgent messages can be sent to your provider as well.   To learn more about what you can do with MyChart, go to [email or web address].    Your next appointment:   1 week(s)  The format for your next appointment:   In Person  Provider:   Quay Burow, MD or APP for follow up cath  Other Instructions  COVID TEST TODAY- [place] AT 1:55PM

## 2019-11-16 NOTE — Assessment & Plan Note (Signed)
History of dyspnea on exertion and exertional chest pain with left upper extremity radiation which began the last several months. She did have a coronary calcium score performed 10/15/2019 which was 1681. A coronary CTA suggested proximal to mid RCA stenosis as well as mid LAD disease. Based on this we decided to proceed with outpatient radial diagnostic coronary angiography.The patient understands that risks included but are not limited to stroke (1 in 1000), death (1 in 61), kidney failure [usually temporary] (1 in 500), bleeding (1 in 200), allergic reaction [possibly serious] (1 in 200). The patient understands and agrees to proceed

## 2019-11-16 NOTE — Progress Notes (Signed)
11/16/2019 [name]   [birth year]  [id number]  Primary Physician [name], [place] Primary Cardiologist: Lorretta Harp MD Lupe Carney, [place]  HPI:  Barbara Maxwell is a 84 y.o.  mildly overweight widowed Caucasian female (husband of 44 years recently passed away), mother of 3 living children (daughter 25 committed suicide), grandmother 83 grandchildren referred by Dr. Valere Dross for cardiovascular valuation because of atypical chest pain and dyspnea.   I last saw her in the office 09/28/2019. Risk factors are only notable for hyperlipidemia treated.  Her father apparently did have bypass surgery and she had a son who had a stent.  She is never had a heart attack or stroke.  She is fairly active and does water aerobics 2 days/week.  She is noticed some dyspnea walking up an incline recently and some atypical chest pain.  She does admit to anxiety since she is living alone currently.  I obtain a 2D echocardiogram on 10/15/2019 which was essentially normal with grade 1 diastolic dysfunction. A coronary calcium score was measured at 1681 with suggestion of proximal to mid RCA disease and LAD disease.   Current Meds  Medication Sig  . acetaminophen (TYLENOL) 325 MG tablet Take 650 mg by mouth every 6 (six) hours as needed for pain.  . Calcium Carbonate-Vitamin D (CALCIUM + D PO) Take 1 tablet by mouth daily.  . cholecalciferol (VITAMIN D) 400 UNITS TABS tablet Take 5,000 Units by mouth.  . ezetimibe (ZETIA) 10 MG tablet TAKE 1 TABLET DAILY  . metoprolol tartrate (LOPRESSOR) 50 MG tablet Take 50 mg 2 hours before coronary ct  . rosuvastatin (CRESTOR) 40 MG tablet TAKE 1 TABLET DAILY  . sertraline (ZOLOFT) 50 MG tablet TAKE 1 TABLET DAILY (NEW DOSE)  . traMADol (ULTRAM) 50 MG tablet Take 1 tablet (50 mg total) by mouth as needed.  . vitamin C (ASCORBIC ACID) 500 MG tablet Take 500 mg by mouth daily.     Allergies  Allergen Reactions  . Bee Venom Anaphylaxis  . Simvastatin      Myopathy  . Codeine Nausea Only  . Evista [Raloxifene]     Hot flashes  . Percocet [Oxycodone-Acetaminophen] Nausea And Vomiting    Nausea and vomiting    Social History   Socioeconomic History  . Marital status: Widowed    Spouse name: Not on file  . Number of children: Not on file  . Years of education: Not on file  . Highest education level: Not on file  Occupational History  . Not on file  Tobacco Use  . Smoking status: Never Smoker  . Smokeless tobacco: Never Used  Vaping Use  . Vaping Use: Never used  Substance and Sexual Activity  . Alcohol use: No    Alcohol/week: 0.0 standard drinks  . Drug use: Not on file  . Sexual activity: Not Currently    Comment: 1st intercourse- 20, partners- 2, widow  Other Topics Concern  . Not on file  Social History Narrative  . Not on file   Social Determinants of Health   Financial Resource Strain:   . Difficulty of Paying Living Expenses: Not on file  Food Insecurity:   . Worried About Charity fundraiser in the Last Year: Not on file  . Ran Out of Food in the Last Year: Not on file  Transportation Needs:   . Lack of Transportation (Medical): Not on file  . Lack of Transportation (Non-Medical): Not on file  Physical Activity:   . Days of Exercise per Week: Not on file  . Minutes of Exercise per Session: Not on file  Stress:   . Feeling of Stress : Not on file  Social Connections:   . Frequency of Communication with Friends and Family: Not on file  . Frequency of Social Gatherings with Friends and Family: Not on file  . Attends Religious Services: Not on file  . Active Member of Clubs or Organizations: Not on file  . Attends Archivist Meetings: Not on file  . Marital Status: Not on file  Intimate Partner Violence:   . Fear of Current or Ex-Partner: Not on file  . Emotionally Abused: Not on file  . Physically Abused: Not on file  . Sexually Abused: Not on file     Review of Systems: General: negative for  chills, fever, night sweats or weight changes.  Cardiovascular: negative for chest pain, dyspnea on exertion, edema, orthopnea, palpitations, paroxysmal nocturnal dyspnea or shortness of breath Dermatological: negative for rash Respiratory: negative for cough or wheezing Urologic: negative for hematuria Abdominal: negative for nausea, vomiting, diarrhea, bright red blood per rectum, melena, or hematemesis Neurologic: negative for visual changes, syncope, or dizziness All other systems reviewed and are otherwise negative except as noted above.    Blood pressure 130/72, pulse 80, height 5\' 3"  (1.6 m), weight 144 lb 6.4 oz (65.5 kg), SpO2 97 %.  General appearance: alert and no distress Neck: no adenopathy, no carotid bruit, no JVD, supple, symmetrical, trachea midline and thyroid not enlarged, symmetric, no tenderness/mass/nodules Lungs: clear to auscultation bilaterally Heart: regular rate and rhythm, S1, S2 normal, no murmur, click, rub or gallop Extremities: extremities normal, atraumatic, no cyanosis or edema Pulses: 2+ and symmetric Skin: Skin color, texture, turgor normal. No rashes or lesions Neurologic: Alert and oriented X 3, normal strength and tone. Normal symmetric reflexes. Normal coordination and gait  EKG not performed today  ASSESSMENT AND PLAN:   Atypical chest pain History of dyspnea on exertion and exertional chest pain with left upper extremity radiation which began the last several months. She did have a coronary calcium score performed 10/15/2019 which was 1681. A coronary CTA suggested proximal to mid RCA stenosis as well as mid LAD disease. Based on this we decided to proceed with outpatient radial diagnostic coronary angiography.The patient understands that risks included but are not limited to stroke (1 in 1000), death (1 in 41), kidney failure [usually temporary] (1 in 500), bleeding (1 in 200), allergic reaction [possibly serious] (1 in 200). The patient  understands and agrees to proceed       Lorretta Harp MD [facility], [facility] 11/16/2019 11:12 AM

## 2019-11-16 NOTE — H&P (View-Only) (Signed)
11/16/2019 Barbara Maxwell   Jun 26, 1935  [id number]  Primary Physician [name], [place] Primary Cardiologist: Lorretta Harp MD Lupe Carney, [place]  HPI:  Barbara Maxwell is a 84 y.o.  mildly overweight widowed Caucasian female (husband of 95 years recently passed away), mother of 3 living children (daughter 28 committed suicide), grandmother 59 grandchildren referred by Dr. Valere Dross for cardiovascular valuation because of atypical chest pain and dyspnea.   I last saw her in the office 09/28/2019. Risk factors are only notable for hyperlipidemia treated.  Her father apparently did have bypass surgery and she had a son who had a stent.  She is never had a heart attack or stroke.  She is fairly active and does water aerobics 2 days/week.  She is noticed some dyspnea walking up an incline recently and some atypical chest pain.  She does admit to anxiety since she is living alone currently.  I obtain a 2D echocardiogram on 10/15/2019 which was essentially normal with grade 1 diastolic dysfunction. A coronary calcium score was measured at 1681 with suggestion of proximal to mid RCA disease and LAD disease.   Current Meds  Medication Sig  . acetaminophen (TYLENOL) 325 MG tablet Take 650 mg by mouth every 6 (six) hours as needed for pain.  . Calcium Carbonate-Vitamin D (CALCIUM + D PO) Take 1 tablet by mouth daily.  . cholecalciferol (VITAMIN D) 400 UNITS TABS tablet Take 5,000 Units by mouth.  . ezetimibe (ZETIA) 10 MG tablet TAKE 1 TABLET DAILY  . metoprolol tartrate (LOPRESSOR) 50 MG tablet Take 50 mg 2 hours before coronary ct  . rosuvastatin (CRESTOR) 40 MG tablet TAKE 1 TABLET DAILY  . sertraline (ZOLOFT) 50 MG tablet TAKE 1 TABLET DAILY (NEW DOSE)  . traMADol (ULTRAM) 50 MG tablet Take 1 tablet (50 mg total) by mouth as needed.  . vitamin C (ASCORBIC ACID) 500 MG tablet Take 500 mg by mouth daily.     Allergies  Allergen Reactions  . Bee Venom Anaphylaxis  . Simvastatin      Myopathy  . Codeine Nausea Only  . Evista [Raloxifene]     Hot flashes  . Percocet [Oxycodone-Acetaminophen] Nausea And Vomiting    Nausea and vomiting    Social History   Socioeconomic History  . Marital status: Widowed    Spouse name: Not on file  . Number of children: Not on file  . Years of education: Not on file  . Highest education level: Not on file  Occupational History  . Not on file  Tobacco Use  . Smoking status: Never Smoker  . Smokeless tobacco: Never Used  Vaping Use  . Vaping Use: Never used  Substance and Sexual Activity  . Alcohol use: No    Alcohol/week: 0.0 standard drinks  . Drug use: Not on file  . Sexual activity: Not Currently    Comment: 1st intercourse- 20, partners- 2, widow  Other Topics Concern  . Not on file  Social History Narrative  . Not on file   Social Determinants of Health   Financial Resource Strain:   . Difficulty of Paying Living Expenses: Not on file  Food Insecurity:   . Worried About Charity fundraiser in the Last Year: Not on file  . Ran Out of Food in the Last Year: Not on file  Transportation Needs:   . Lack of Transportation (Medical): Not on file  . Lack of Transportation (Non-Medical): Not on file  Physical Activity:   . Days of Exercise per Week: Not on file  . Minutes of Exercise per Session: Not on file  Stress:   . Feeling of Stress : Not on file  Social Connections:   . Frequency of Communication with Friends and Family: Not on file  . Frequency of Social Gatherings with Friends and Family: Not on file  . Attends Religious Services: Not on file  . Active Member of Clubs or Organizations: Not on file  . Attends Archivist Meetings: Not on file  . Marital Status: Not on file  Intimate Partner Violence:   . Fear of Current or Ex-Partner: Not on file  . Emotionally Abused: Not on file  . Physically Abused: Not on file  . Sexually Abused: Not on file     Review of Systems: General: negative for  chills, fever, night sweats or weight changes.  Cardiovascular: negative for chest pain, dyspnea on exertion, edema, orthopnea, palpitations, paroxysmal nocturnal dyspnea or shortness of breath Dermatological: negative for rash Respiratory: negative for cough or wheezing Urologic: negative for hematuria Abdominal: negative for nausea, vomiting, diarrhea, bright red blood per rectum, melena, or hematemesis Neurologic: negative for visual changes, syncope, or dizziness All other systems reviewed and are otherwise negative except as noted above.    Blood pressure 130/72, pulse 80, height 5\' 3"  (1.6 m), weight 144 lb 6.4 oz (65.5 kg), SpO2 97 %.  General appearance: alert and no distress Neck: no adenopathy, no carotid bruit, no JVD, supple, symmetrical, trachea midline and thyroid not enlarged, symmetric, no tenderness/mass/nodules Lungs: clear to auscultation bilaterally Heart: regular rate and rhythm, S1, S2 normal, no murmur, click, rub or gallop Extremities: extremities normal, atraumatic, no cyanosis or edema Pulses: 2+ and symmetric Skin: Skin color, texture, turgor normal. No rashes or lesions Neurologic: Alert and oriented X 3, normal strength and tone. Normal symmetric reflexes. Normal coordination and gait  EKG not performed today  ASSESSMENT AND PLAN:   Atypical chest pain History of dyspnea on exertion and exertional chest pain with left upper extremity radiation which began the last several months. She did have a coronary calcium score performed 10/15/2019 which was 1681. A coronary CTA suggested proximal to mid RCA stenosis as well as mid LAD disease. Based on this we decided to proceed with outpatient radial diagnostic coronary angiography.The patient understands that risks included but are not limited to stroke (1 in 1000), death (1 in 54), kidney failure [usually temporary] (1 in 500), bleeding (1 in 200), allergic reaction [possibly serious] (1 in 200). The patient  understands and agrees to proceed       Lorretta Harp MD [facility], [facility] 11/16/2019 11:12 AM

## 2019-11-17 LAB — CBC
Hematocrit: 34 % (ref 34.0–46.6)
Hemoglobin: 10.4 g/dL — ABNORMAL LOW (ref 11.1–15.9)
MCH: 23.4 pg — ABNORMAL LOW (ref 26.6–33.0)
MCHC: 30.6 g/dL — ABNORMAL LOW (ref 31.5–35.7)
MCV: 76 fL — ABNORMAL LOW (ref 79–97)
Platelets: 325 10*3/uL (ref 150–450)
RBC: 4.45 x10E6/uL (ref 3.77–5.28)
RDW: 15.6 % — ABNORMAL HIGH (ref 11.7–15.4)
WBC: 6.6 10*3/uL (ref 3.4–10.8)

## 2019-11-17 LAB — BASIC METABOLIC PANEL
BUN/Creatinine Ratio: 15 (ref 12–28)
BUN: 13 mg/dL (ref 8–27)
CO2: 23 mmol/L (ref 20–29)
Calcium: 9.7 mg/dL (ref 8.7–10.3)
Chloride: 103 mmol/L (ref 96–106)
Creatinine, Ser: 0.85 mg/dL (ref 0.57–1.00)
GFR calc Af Amer: 74 mL/min/{1.73_m2} (ref >59)
GFR calc non Af Amer: 64 mL/min/{1.73_m2} (ref >59)
Glucose: 89 mg/dL (ref 65–99)
Potassium: 4.8 mmol/L (ref 3.5–5.2)
Sodium: 141 mmol/L (ref 134–144)

## 2019-11-19 ENCOUNTER — Ambulatory Visit (HOSPITAL_COMMUNITY)
Admission: RE | Admit: 2019-11-19 | Discharge: 2019-11-20 | Disposition: A | Discharge status: Home / Self Care | Payer: Medicare Other | Attending: Cardiovascular Disease | Admitting: Cardiovascular Disease

## 2019-11-19 ENCOUNTER — Ambulatory Visit (HOSPITAL_COMMUNITY)
Admission: RE | Disposition: A | Discharge status: Home / Self Care | Payer: Self-pay | Source: Home / Self Care | Attending: Cardiovascular Disease

## 2019-11-19 ENCOUNTER — Encounter (HOSPITAL_COMMUNITY): Payer: Self-pay | Admitting: Cardiovascular Disease

## 2019-11-19 ENCOUNTER — Other Ambulatory Visit: Payer: Self-pay

## 2019-11-19 DIAGNOSIS — F419 Anxiety disorder, unspecified: Secondary | ICD-10-CM | POA: Diagnosis not present

## 2019-11-19 DIAGNOSIS — I2510 Atherosclerotic heart disease of native coronary artery without angina pectoris: Secondary | ICD-10-CM | POA: Diagnosis present

## 2019-11-19 DIAGNOSIS — Z79899 Other long term (current) drug therapy: Secondary | ICD-10-CM | POA: Insufficient documentation

## 2019-11-19 DIAGNOSIS — E782 Mixed hyperlipidemia: Secondary | ICD-10-CM | POA: Diagnosis present

## 2019-11-19 DIAGNOSIS — I10 Essential (primary) hypertension: Secondary | ICD-10-CM | POA: Diagnosis not present

## 2019-11-19 DIAGNOSIS — R0609 Other forms of dyspnea: Secondary | ICD-10-CM | POA: Diagnosis not present

## 2019-11-19 DIAGNOSIS — Z885 Allergy status to narcotic agent status: Secondary | ICD-10-CM | POA: Diagnosis not present

## 2019-11-19 DIAGNOSIS — R072 Precordial pain: Secondary | ICD-10-CM

## 2019-11-19 DIAGNOSIS — I25118 Atherosclerotic heart disease of native coronary artery with other forms of angina pectoris: Secondary | ICD-10-CM | POA: Insufficient documentation

## 2019-11-19 DIAGNOSIS — R0789 Other chest pain: Secondary | ICD-10-CM | POA: Diagnosis present

## 2019-11-19 HISTORY — DX: Atherosclerotic heart disease of native coronary artery without angina pectoris: I25.10

## 2019-11-19 HISTORY — PX: LEFT HEART CATH AND CORONARY ANGIOGRAPHY: CATH118249

## 2019-11-19 HISTORY — PX: CARDIAC CATHETERIZATION: SHX172

## 2019-11-19 HISTORY — PX: CORONARY BALLOON ANGIOPLASTY: CATH118233

## 2019-11-19 LAB — POCT ACTIVATED CLOTTING TIME
Activated Clotting Time: 224 seconds
Activated Clotting Time: 224 seconds
Activated Clotting Time: 252 seconds
Activated Clotting Time: 202 seconds
Activated Clotting Time: 191 seconds

## 2019-11-19 SURGERY — LEFT HEART CATH AND CORONARY ANGIOGRAPHY
Anesthesia: LOCAL

## 2019-11-19 MED ORDER — HYDRALAZINE HCL 20 MG/ML IJ SOLN: 10.0000 mg | INTRAMUSCULAR | Status: AC | PRN

## 2019-11-19 MED ORDER — VERAPAMIL HCL 2.5 MG/ML IV SOLN
INTRAVENOUS | Status: AC
  Filled 2019-11-19: qty 2

## 2019-11-19 MED ORDER — ROSUVASTATIN CALCIUM 20 MG PO TABS
40.0000 mg | ORAL_TABLET | Freq: Every day | ORAL | Status: DC
  Administered 2019-11-20: 40 mg via ORAL
  Filled 2019-11-19: qty 2

## 2019-11-19 MED ORDER — CLOPIDOGREL BISULFATE 75 MG PO TABS
75.0000 mg | ORAL_TABLET | Freq: Every day | ORAL | Status: DC
  Administered 2019-11-20: 75 mg via ORAL
  Filled 2019-11-19: qty 1

## 2019-11-19 MED ORDER — EZETIMIBE 10 MG PO TABS
10.0000 mg | ORAL_TABLET | Freq: Every day | ORAL | Status: DC
  Administered 2019-11-20: 10 mg via ORAL
  Filled 2019-11-19: qty 1

## 2019-11-19 MED ORDER — SERTRALINE HCL 50 MG PO TABS
50.0000 mg | ORAL_TABLET | Freq: Every day | ORAL | Status: DC
  Administered 2019-11-20: 50 mg via ORAL
  Filled 2019-11-19: qty 1

## 2019-11-19 MED ORDER — CLOPIDOGREL BISULFATE 300 MG PO TABS
ORAL_TABLET | ORAL | Status: AC
  Filled 2019-11-19: qty 1

## 2019-11-19 MED ORDER — ACETAMINOPHEN 325 MG PO TABS: 650.0000 mg | ORAL_TABLET | ORAL | Status: DC | PRN

## 2019-11-19 MED ORDER — ASPIRIN 81 MG PO CHEW
81.0000 mg | CHEWABLE_TABLET | Freq: Every day | ORAL | Status: DC
  Administered 2019-11-20: 81 mg via ORAL
  Filled 2019-11-19: qty 1

## 2019-11-19 MED ORDER — SODIUM CHLORIDE 0.9 % WEIGHT BASED INFUSION
3.0000 mL/kg/h | INTRAVENOUS | Status: DC
  Administered 2019-11-19: 3 mL/kg/h via INTRAVENOUS

## 2019-11-19 MED ORDER — LIDOCAINE HCL (PF) 1 % IJ SOLN
INTRAMUSCULAR | Status: DC | PRN
  Administered 2019-11-19: 2 mL
  Administered 2019-11-19: 15 mL

## 2019-11-19 MED ORDER — HEPARIN SODIUM (PORCINE) 1000 UNIT/ML IJ SOLN
INTRAMUSCULAR | Status: AC
  Filled 2019-11-19: qty 1

## 2019-11-19 MED ORDER — SODIUM CHLORIDE 0.9 % WEIGHT BASED INFUSION
1.0000 mL/kg/h | INTRAVENOUS | Status: DC
  Administered 2019-11-19: 1 mL/kg/h via INTRAVENOUS

## 2019-11-19 MED ORDER — VERAPAMIL HCL 2.5 MG/ML IV SOLN
INTRAVENOUS | Status: DC | PRN
  Administered 2019-11-19: 10 mL via INTRA_ARTERIAL

## 2019-11-19 MED ORDER — SODIUM CHLORIDE 0.9% FLUSH: 3.0000 mL | INTRAVENOUS | Status: DC | PRN

## 2019-11-19 MED ORDER — CLOPIDOGREL BISULFATE 300 MG PO TABS
ORAL_TABLET | ORAL | Status: DC | PRN
  Administered 2019-11-19: 600 mg via ORAL

## 2019-11-19 MED ORDER — SODIUM CHLORIDE 0.9 % IV SOLN
INTRAVENOUS | Status: AC | PRN
  Administered 2019-11-19: 10 mL/h via INTRAVENOUS

## 2019-11-19 MED ORDER — SODIUM CHLORIDE 0.9% FLUSH
3.0000 mL | Freq: Two times a day (BID) | INTRAVENOUS | Status: DC
  Administered 2019-11-20: 3 mL via INTRAVENOUS

## 2019-11-19 MED ORDER — LABETALOL HCL 5 MG/ML IV SOLN: 10.0000 mg | INTRAVENOUS | Status: AC | PRN

## 2019-11-19 MED ORDER — NITROGLYCERIN 1 MG/10 ML FOR IR/CATH LAB
INTRA_ARTERIAL | Status: AC
  Filled 2019-11-19: qty 10

## 2019-11-19 MED ORDER — HEPARIN (PORCINE) IN NACL 1000-0.9 UT/500ML-% IV SOLN
INTRAVENOUS | Status: AC
  Filled 2019-11-19: qty 1000

## 2019-11-19 MED ORDER — IOHEXOL 350 MG/ML SOLN
INTRAVENOUS | Status: DC | PRN
  Administered 2019-11-19: 125 mL

## 2019-11-19 MED ORDER — SODIUM CHLORIDE 0.9 % IV SOLN: INTRAVENOUS | Status: AC

## 2019-11-19 MED ORDER — HEPARIN SODIUM (PORCINE) 1000 UNIT/ML IJ SOLN
INTRAMUSCULAR | Status: DC | PRN
  Administered 2019-11-19: 3000 [IU] via INTRAVENOUS
  Administered 2019-11-19 (×2): 4000 [IU] via INTRAVENOUS

## 2019-11-19 MED ORDER — HEPARIN (PORCINE) IN NACL 1000-0.9 UT/500ML-% IV SOLN
INTRAVENOUS | Status: DC | PRN
  Administered 2019-11-19 (×2): 500 mL

## 2019-11-19 MED ORDER — SODIUM CHLORIDE 0.9 % IV SOLN: 250.0000 mL | INTRAVENOUS | Status: DC | PRN

## 2019-11-19 MED ORDER — LIDOCAINE HCL (PF) 1 % IJ SOLN
INTRAMUSCULAR | Status: AC
  Filled 2019-11-19: qty 30

## 2019-11-19 MED ORDER — ONDANSETRON HCL 4 MG/2ML IJ SOLN: 4.0000 mg | Freq: Four times a day (QID) | INTRAMUSCULAR | Status: DC | PRN

## 2019-11-19 MED ORDER — ASPIRIN 81 MG PO CHEW: 81.0000 mg | CHEWABLE_TABLET | ORAL | Status: DC

## 2019-11-19 SURGICAL SUPPLY — 18 items
CATH INFINITI 5FR ANG PIGTAIL (CATHETERS) ×1 IMPLANT
CATH OPTITORQUE TIG 4.0 5F (CATHETERS) ×1 IMPLANT
CATH S G BIP PACING (CATHETERS) ×1 IMPLANT
CATH VISTA GUIDE 6FR JR4 (CATHETERS) ×1 IMPLANT
DEVICE RAD COMP TR BAND LRG (VASCULAR PRODUCTS) ×1 IMPLANT
ELECT DEFIB PAD ADLT CADENCE (PAD) ×1 IMPLANT
GLIDESHEATH SLEND A-KIT 6F 22G (SHEATH) ×1 IMPLANT
GUIDEWIRE INQWIRE 1.5J.035X260 (WIRE) IMPLANT
INQWIRE 1.5J .035X260CM (WIRE) ×2
KIT ENCORE 26 ADVANTAGE (KITS) ×1 IMPLANT
KIT HEART LEFT (KITS) ×2 IMPLANT
PACK CARDIAC CATHETERIZATION (CUSTOM PROCEDURE TRAY) ×2 IMPLANT
SHEATH PINNACLE 6F 10CM (SHEATH) ×1 IMPLANT
SLEEVE REPOSITIONING LENGTH 30 (MISCELLANEOUS) ×1 IMPLANT
TRANSDUCER W/STOPCOCK (MISCELLANEOUS) ×2 IMPLANT
TUBING CIL FLEX 10 FLL-RA (TUBING) ×2 IMPLANT
WIRE ASAHI FIELDER XT 300CM (WIRE) ×1 IMPLANT
WIRE HI TORQ VERSACORE-J 145CM (WIRE) ×1 IMPLANT

## 2019-11-19 NOTE — Interval H&P Note (Signed)
Cath Lab Visit (complete for each Cath Lab visit)  Clinical Evaluation Leading to the Procedure:   ACS: No.  Non-ACS:    Anginal Classification: CCS II  Anti-ischemic medical therapy: Minimal Therapy (1 class of medications)  Non-Invasive Test Results: No non-invasive testing performed  Prior CABG: No previous CABG      History and Physical Interval Note:  11/19/2019 1:27 PM  Barbara Maxwell  has presented today for surgery, with the diagnosis of abn coronary cta.  The various methods of treatment have been discussed with the patient and family. After consideration of risks, benefits and other options for treatment, the patient has consented to  Procedure(s): LEFT HEART CATH AND CORONARY ANGIOGRAPHY (N/A) as a surgical intervention.  The patient's history has been reviewed, patient examined, no change in status, stable for surgery.  I have reviewed the patient's chart and labs.  Questions were answered to the patient's satisfaction.     Quay Burow

## 2019-11-19 NOTE — Progress Notes (Signed)
Site area: rt groin venous sheath Site Prior to Removal:  Level 0 Pressure Applied For: 20 minutes Manual:   yes Patient Status During Pull:  stable Post Pull Site:  Level 0 Post Pull Instructions Given:  yes Post Pull Pulses Present: rt dp palpable Dressing Applied:  Gauze and tegaderm Bedrest begins @ 9217 Comments:

## 2019-11-20 ENCOUNTER — Encounter (HOSPITAL_COMMUNITY): Payer: Self-pay | Admitting: Cardiovascular Disease

## 2019-11-20 DIAGNOSIS — E782 Mixed hyperlipidemia: Secondary | ICD-10-CM | POA: Diagnosis not present

## 2019-11-20 DIAGNOSIS — R0609 Other forms of dyspnea: Secondary | ICD-10-CM | POA: Diagnosis not present

## 2019-11-20 DIAGNOSIS — Z79899 Other long term (current) drug therapy: Secondary | ICD-10-CM | POA: Diagnosis not present

## 2019-11-20 DIAGNOSIS — I25118 Atherosclerotic heart disease of native coronary artery with other forms of angina pectoris: Secondary | ICD-10-CM | POA: Diagnosis not present

## 2019-11-20 DIAGNOSIS — I10 Essential (primary) hypertension: Secondary | ICD-10-CM | POA: Diagnosis not present

## 2019-11-20 DIAGNOSIS — Z885 Allergy status to narcotic agent status: Secondary | ICD-10-CM | POA: Diagnosis not present

## 2019-11-20 DIAGNOSIS — F419 Anxiety disorder, unspecified: Secondary | ICD-10-CM | POA: Diagnosis not present

## 2019-11-20 LAB — BASIC METABOLIC PANEL
Anion gap: 11 (ref 5–15)
BUN: 13 mg/dL (ref 8–23)
CO2: 20 mmol/L — ABNORMAL LOW (ref 22–32)
Calcium: 8.8 mg/dL — ABNORMAL LOW (ref 8.9–10.3)
Chloride: 109 mmol/L (ref 98–111)
Creatinine, Ser: 0.77 mg/dL (ref 0.44–1.00)
GFR calc Af Amer: >60 mL/min (ref >60)
GFR calc non Af Amer: >60 mL/min (ref >60)
Glucose, Bld: 94 mg/dL (ref 70–99)
Potassium: 3.8 mmol/L (ref 3.5–5.1)
Sodium: 140 mmol/L (ref 135–145)

## 2019-11-20 LAB — CBC
HCT: 32.3 % — ABNORMAL LOW (ref 36.0–46.0)
Hemoglobin: 9.6 g/dL — ABNORMAL LOW (ref 12.0–15.0)
MCH: 23.2 pg — ABNORMAL LOW (ref 26.0–34.0)
MCHC: 29.7 g/dL — ABNORMAL LOW (ref 30.0–36.0)
MCV: 78 fL — ABNORMAL LOW (ref 80.0–100.0)
Platelets: 281 10*3/uL (ref 150–400)
RBC: 4.14 MIL/uL (ref 3.87–5.11)
RDW: 16.3 % — ABNORMAL HIGH (ref 11.5–15.5)
WBC: 6.2 10*3/uL (ref 4.0–10.5)
nRBC: 0 % (ref 0.0–0.2)

## 2019-11-20 LAB — LIPID PANEL
Cholesterol: 119 mg/dL (ref 0–200)
HDL: 45 mg/dL (ref >40)
LDL Cholesterol: 51 mg/dL (ref 0–99)
Total CHOL/HDL Ratio: 2.6 RATIO
Triglycerides: 117 mg/dL (ref <150)
VLDL: 23 mg/dL (ref 0–40)

## 2019-11-20 MED ORDER — CLOPIDOGREL BISULFATE 75 MG PO TABS: 75.0000 mg | ORAL_TABLET | Freq: Every day | ORAL | 0 refills | Status: DC

## 2019-11-20 MED ORDER — METOPROLOL SUCCINATE ER 25 MG PO TB24
25.0000 mg | ORAL_TABLET | Freq: Every day | ORAL | Status: DC
  Administered 2019-11-20: 25 mg via ORAL
  Filled 2019-11-20: qty 1

## 2019-11-20 MED ORDER — CLOPIDOGREL BISULFATE 75 MG PO TABS: 75.0000 mg | ORAL_TABLET | Freq: Every day | ORAL | 3 refills | Status: DC

## 2019-11-20 MED ORDER — NITROGLYCERIN 0.4 MG SL SUBL: 0.4000 mg | SUBLINGUAL_TABLET | SUBLINGUAL | 3 refills | Status: DC | PRN

## 2019-11-20 MED ORDER — METOPROLOL SUCCINATE ER 25 MG PO TB24
25.0000 mg | ORAL_TABLET | Freq: Every day | ORAL | 3 refills | Status: DC
Start: 2019-11-20 — End: 2019-11-27

## 2019-11-20 MED ORDER — ASPIRIN EC 81 MG PO TBEC: 81.0000 mg | DELAYED_RELEASE_TABLET | Freq: Every day | ORAL | 3 refills | Status: AC

## 2019-11-20 MED ORDER — METOPROLOL SUCCINATE ER 25 MG PO TB24
25.0000 mg | ORAL_TABLET | Freq: Every day | ORAL | 0 refills | Status: DC
Start: 2019-11-20 — End: 2019-11-20

## 2019-11-20 MED ORDER — ASPIRIN EC 81 MG PO TBEC: 81.0000 mg | DELAYED_RELEASE_TABLET | Freq: Every day | ORAL | 0 refills | Status: DC

## 2019-11-20 MED FILL — Nitroglycerin IV Soln 100 MCG/ML in D5W: INTRA_ARTERIAL | Qty: 10 | Status: AC

## 2019-11-20 NOTE — Discharge Summary (Addendum)
Discharge Summary    Patient ID: Barbara Maxwell,  MRN: [id number], DOB/AGE: May 26, 1935 84 y.o.  Admit date: 11/19/2019 Discharge date: 11/20/2019  Primary Care Provider: [name] Primary Cardiologist: Dr. Gwenlyn Found   Discharge Diagnoses    Principal Problem:   CAD (coronary artery disease) Active Problems:   Mixed hyperlipidemia   Atypical chest pain  Allergies Allergies  Allergen Reactions  . Bee Venom Anaphylaxis  . Simvastatin     Myopathy  . Codeine Nausea Only  . Evista [Raloxifene]     Hot flashes  . Percocet [Oxycodone-Acetaminophen] Nausea And Vomiting    Diagnostic Studies/Procedures    Cath: 11/19/19  IMPRESSION:Barbara Maxwell has moderate proximal LAD disease after the first moderate sized first diagonal branch and high-grade subtotally occluded ostial dominant RCA stenosis with heavy calcification and grade 2 3 left-to-right collaterals. Her echo revealed normal LV function. Plan will be to begin her on a good antianginal regimen. If she has continued effort angina despite being on adequate therapeutic doses of medications we will contemplate readdressing her ostial RCA.  Quay Burow. MD, [facility] _____________   History of Present Illness     Barbara Maxwell is a 84 y.o.who was referred by Dr. Athena Masse Dr. Gwenlyn Found for cardiovascular valuation because of atypical chest pain and dyspnea. Risk factors are only notable for hyperlipidemia treated. Her father apparently did have bypass surgery and she had a son who had a stent. She has never had a heart attack or stroke. She is fairly active and does water aerobics 2 days/week. She had noticed some dyspnea walking up an incline recently and some atypical chest pain. She does admit to anxiety since she is living alone currently.  2D echocardiogram on 10/15/2019 which was essentially normal with grade 1 diastolic dysfunction. A coronary calcium score was measured at 1681 with suggestion of proximal to mid  RCA disease and LAD disease. She was seen in follow up in the office and outpatient cardiac cath was discussed.   Hospital Course     1. CAD: underwent cardiac cath noted above with subtotally occluded ostial RCA with left to right collaterals, with moderate pLAD disease after 1st OM. Given the vessel was heavily calcified and multiple attempts made to treat, will plan for medical therapy. Consider readdressing if fails medical therapy. Normal LV function on recent echo -- on ASA, plavix, statin, zetia. Added low dose Toprol 25mg  daily -- ambulated with cardiac rehab without chest pain  2. HLD: on crestor 40mg  and zetia 10mg . Last LDL 4/21 was 79.  -- Will plan to refer to lipid clinic at discharge for possible PCSK9.  3. HTN: Blood pressures have been elevated. Added Toprol as above _____________  Discharge Vitals Blood pressure 124/64, pulse 93, temperature 98.3 F (36.8 C), temperature source Oral, resp. rate 16, height 5\' 3"  (1.6 m), weight 64 kg, SpO2 99 %.  Filed Weights   11/19/19 0938 11/20/19 0622  Weight: 63.5 kg 64 kg    Labs & Radiologic Studies    CBC Recent Labs    11/20/19 0238  WBC 6.2  HGB 9.6*  HCT 32.3*  MCV 78.0*  PLT 010   Basic Metabolic Panel Recent Labs    11/20/19 0238  NA 140  K 3.8  CL 109  CO2 20*  GLUCOSE 94  BUN 13  CREATININE 0.77  CALCIUM 8.8*   Liver Function Tests No results for input(s): AST, ALT, ALKPHOS, BILITOT, PROT, ALBUMIN in the last 72 hours. No  results for input(s): LIPASE, AMYLASE in the last 72 hours. Cardiac Enzymes No results for input(s): CKTOTAL, CKMB, CKMBINDEX, TROPONINI in the last 72 hours. BNP Invalid input(s): POCBNP D-Dimer No results for input(s): DDIMER in the last 72 hours. Hemoglobin A1C No results for input(s): HGBA1C in the last 72 hours. Fasting Lipid Panel No results for input(s): CHOL, HDL, LDLCALC, TRIG, CHOLHDL, LDLDIRECT in the last 72 hours. Thyroid Function Tests No results for  input(s): TSH, T4TOTAL, T3FREE, THYROIDAB in the last 72 hours.  Invalid input(s): FREET3 _____________  DG Chest 2 View  Result Date: 11/13/2019 CLINICAL DATA:  LEFT arm pain, fall 3 weeks ago EXAM: LEFT RIBS - 2 VIEW; CHEST - 2 VIEW COMPARISON:  November 07, 2019 FINDINGS: The cardiomediastinal silhouette is normal in contour. No pleural effusion. No pneumothorax. No acute pleuroparenchymal abnormality. Large hiatal hernia. There is a minimally displaced lateral rib fracture of the LEFT lateral seventh rib. IMPRESSION: Minimally displaced lateral rib fracture of the LEFT lateral seventh rib. No pneumothorax. Electronically Signed   By: Valentino Saxon MD   On: 11/13/2019 15:01   DG Ribs Unilateral Left  Result Date: 11/13/2019 CLINICAL DATA:  LEFT arm pain, fall 3 weeks ago EXAM: LEFT RIBS - 2 VIEW; CHEST - 2 VIEW COMPARISON:  November 07, 2019 FINDINGS: The cardiomediastinal silhouette is normal in contour. No pleural effusion. No pneumothorax. No acute pleuroparenchymal abnormality. Large hiatal hernia. There is a minimally displaced lateral rib fracture of the LEFT lateral seventh rib. IMPRESSION: Minimally displaced lateral rib fracture of the LEFT lateral seventh rib. No pneumothorax. Electronically Signed   By: Valentino Saxon MD   On: 11/13/2019 15:01   CARDIAC CATHETERIZATION  Result Date: 11/19/2019  Ost RCA to Prox RCA lesion is 99% stenosed.  Prox LAD to Mid LAD lesion is 60% stenosed.  Barbara Maxwell is a 84 y.o. female  [id number] LOCATION:  FACILITY: [facility] PHYSICIAN: Quay Burow, M.D. February 05, 1936 DATE OF PROCEDURE:  11/19/2019 DATE OF DISCHARGE: CARDIAC CATHETERIZATION / Aborted PCI Ostial RCA History obtained from chart review.Barbara Maxwell is a 84 y.o.  mildly overweight widowed Caucasian female (husband of 55 years recently passed away), mother of 3 living children (daughter 70 committed suicide), grandmother 6 grandchildren referred by Dr. Fatima Blank cardiovascular valuation  because of atypical chest pain and dyspnea.  I last saw her in the office 09/28/2019. Risk factors are only notable for hyperlipidemia treated. Her father apparently did have bypass surgery and she had a son who had a stent. She is never had a heart attack or stroke. She is fairly active and does water aerobics 2 days/week. She is noticed some dyspnea walking up an incline recently and some atypical chest pain. She does admit to anxiety since she is living alone currently.  I obtain a 2D echocardiogram on 10/15/2019 which was essentially normal with grade 1 diastolic dysfunction. A coronary calcium score was measured at 1681 with suggestion of proximal to mid RCA disease and LAD disease. PROCEDURE DESCRIPTION: The patient was brought to the second floor [facility] cath lab in the postabsorptive state.  She was not premedicated .  Her right wrist was prepped and shaved in usual sterile fashion. Xylocaine 1% was used for local anesthesia. A 6 French sheath was inserted into the right radial artery using standard Seldinger technique.  Patient received 3000's of IV heparin.  He is a 5 [place] TIG catheter was used for selective coronary angiography and obtain left heart pressures.  Isovue dye  was used for the entirety of the case.  Retrograde aorta, ventricular and pullback pressures were recorded.  Radial cocktail was administered via the SideArm sheath. The patient received an additional 4000 units of heparin in addition to 3000.  The ending ACT was 252.  The patient received Plavix 60 mg p.o. followed by Pepcid 20 mg IV.  On the podiatrist for the entirety of the intervention.  A 5 [place] balloontipped temporary transvenous pacemaker was then floated into the RV apex and paced MA of 5. The decision was made to perform orbital atherectomy of the ostium of the RCA.  There were grade 2-3 left-to-right collaterals.  Using a 6 [place] JR4 guide catheter along with a 300 cm 014 Fielder XT wire I was able to cross  the lesion but was never convinced that I was in the true lumen beyond the proximal part of the vessel.  It did select several small branches.  After multiple attempts to find the true lumen I aborted the procedure.  Patient remained stable throughout the case.  The guidewire and guide catheter were removed.  The temper transvenous pacemaker was removed.  The radial sheath was removed and a TR band was placed in the right wrist to achieve patent hemostasis.  The femoral sheath was removed and pressure was held.  Patient left lab in stable condition.   Barbara Maxwell has moderate proximal LAD disease after the first moderate sized first diagonal branch and high-grade subtotally occluded ostial dominant RCA stenosis with heavy calcification and grade 2 3 left-to-right collaterals.  Her echo revealed normal LV function.  Plan will be to begin her on a good antianginal regimen.  If she has continued effort angina despite being on adequate therapeutic doses of medications we will contemplate readdressing her ostial RCA.  Quay Burow. MD, [facility] 11/19/2019 2:56 PM   CT CORONARY MORPH W/CTA COR W/SCORE W/CA W/CM &/OR WO/CM  Addendum Date: 11/07/2019   ADDENDUM REPORT: 11/07/2019 17:01 CLINICAL DATA:  Chest pain EXAM: Cardiac CTA MEDICATIONS: Sub lingual nitro. 4mg  x 2 TECHNIQUE: The patient was scanned on a Siemens 222 slice scanner. Gantry rotation speed was 250 msecs. Collimation was 0.6 mm. A 100 kV prospective scan was triggered in the ascending thoracic aorta at 35-75% of the R-R interval. Average HR during the scan was 60 bpm. The 3D data set was interpreted on a dedicated work station using MPR, MIP and VRT modes. A total of 80cc of contrast was used. FINDINGS: Non-cardiac: See separate report from [facility]. No Barbara appendage thrombus noted. Pulmonary veins drain normally to the left atrium. Calcium Score: 1826 Agatston units. Coronary Arteries: Right dominant with no anomalies LM: Calcified plaque  ostial and distal LM, mild (<50%) stenosis. LAD system: Extensive primarily calcified plaque in the proximal LAD, suspect mild (<50%) stenosis. Large D1, extensive mixed plaque proximally. Possible moderate (51-69%) stenosis. Circumflex system: Calcified plaque proximal and mid LCx, mild (<50%) stenosis. RCA system: Heavily calcified ostial/proximal RCA, cannot rule out moderate (51-69%) stenosis. Calcified plaque in the mid and distal RCA, mild (<50%) stenosis. IMPRESSION: 1. Coronary artery calcium score 1826 Agatston units. This places the patient in the 95th percentile for age and gender, suggesting high risk for future cardiac events. 2. Extensive ostial/proximal RCA calcification. Possible moderate stenosis but cannot rule out blooming artifact making this look worse. 3. Extensive proximal LAD plaque, suspect no more than mild stenosis. Large D1 with possible moderate proximal stenosis. Will send for FFR. Dalton Engineer, mining  By: Loralie Champagne M.D.   On: 11/07/2019 17:01   Result Date: 11/07/2019 EXAM: OVER-READ INTERPRETATION  CT CHEST The following report is an over-read performed by radiologist Dr. Vinnie Langton of [facility], [place] on 11/07/2019. This over-read does not include interpretation of cardiac or coronary anatomy or pathology. The coronary calcium score/coronary CTA interpretation by the cardiologist is attached. COMPARISON:  None. FINDINGS: Aortic atherosclerosis. Large hiatal hernia. Within the visualized portions of the thorax there are no suspicious appearing pulmonary nodules or masses, there is no acute consolidative airspace disease, no pleural effusions, no pneumothorax and no lymphadenopathy. Visualized portions of the upper abdomen are unremarkable. There are no aggressive appearing lytic or blastic lesions noted in the visualized portions of the skeleton. IMPRESSION: 1. Large hiatal hernia. 2. Aortic Atherosclerosis (ICD10-I70.0). Electronically Signed: By:  Vinnie Langton M.D. On: 11/07/2019 13:11   CT CORONARY FRACTIONAL FLOW RESERVE DATA PREP  Result Date: 11/09/2019 CLINICAL DATA:  Chest pain EXAM: CT FFR MEDICATIONS: No additional medications. TECHNIQUE: The coronary CTA was sent for FFR. FINDINGS: Mid RCA FFR 0.79. Mid LCx FFR 0.94 Distal LAD FFR 0.65 Unable to calculate FFR for moderate-large D1. IMPRESSION: 1. Proximal to mid RCA stenosis is of borderline hemodynamic significance. 2. There does appear to be hemodynamically significant mid LAD stenosis. 3. Unable to calculate FFR for moderate-large D1, vessel was heavily calcified proximally on the CT images. Dalton Mclean Electronically Signed   By: Loralie Champagne M.D.   On: 11/09/2019 10:33   Disposition   Pt is being discharged home today in good condition.  Follow-up Plans & Appointments     Follow-up Information    Lorretta Harp, MD Follow up on 11/27/2019.   Specialties: Cardiology, Radiology Why: at 10:30am for your follow up appt Contact information: [address] [place] [address] [phone]              Discharge Instructions    AMB Referral to Advanced Lipid Disorders Clinic   Complete by: As directed    On statin and Zetia, LDL not at goal. CAD on cath   Reason for referral: Other   Provider to see patient: PharmD   Internal Lipid Clinic Referral Scheduling  Internal lipid clinic referrals are providers within [facility], who wish to refer established patients for routine management (help in starting PCSK9 inhibitor therapy) or advanced therapies.  Internal MD referral criteria:              1. All patients with LDL>190 mg/dL  2. All patients with Triglycerides >500 mg/dL  3. Patients with suspected or confirmed heterozygous familial hyperlipidemia (HeFH) or homozygous familial hyperlipidemia (HoFH)  4. Patients with family history of suspicious for genetic dyslipidemia desiring genetic testing  5. Patients refractory to standard  guideline based therapy  6. Patients with statin intolerance (failed 2 statins, one of which must be a high potency statin)  7. Patients who the provider desires to be seen by MD   Internal PharmD referral criteria:   1. Follow-up patients for medication management  2. Follow-up for compliance monitoring  3. Patients for drug education  4. Patients with statin intolerance  5. PCSK9 inhibitor education and prior authorization approvals  6. Patients with triglycerides <500 mg/dL  External Lipid Clinic Referral  External lipid clinic referrals are for providers outside of [facility], considered new clinic patients - automatically routed to MD schedule   Call MD for:  redness, tenderness, or signs of infection (pain, swelling,  redness, odor or green/yellow discharge around incision site)   Complete by: As directed    Diet - low sodium heart healthy   Complete by: As directed    Discharge instructions   Complete by: As directed    Radial Site Care Refer to this sheet in the next few weeks. These instructions provide you with information on caring for yourself after your procedure. Your caregiver may also give you more specific instructions. Your treatment has been planned according to current medical practices, but problems sometimes occur. Call your caregiver if you have any problems or questions after your procedure. HOME CARE INSTRUCTIONS You may shower the day after the procedure.Remove the bandage (dressing) and gently wash the site with plain soap and water.Gently pat the site dry.  Do not apply powder or lotion to the site.  Do not submerge the affected site in water for 3 to 5 days.  Inspect the site at least twice daily.  Do not flex or bend the affected arm for 24 hours.  No lifting over 5 pounds (2.3 kg) for 5 days after your procedure.  Do not drive home if you are discharged the same day of the procedure. Have someone else drive you.  You may drive 24 hours after the  procedure unless otherwise instructed by your caregiver.  What to expect: Any bruising will usually fade within 1 to 2 weeks.  Blood that collects in the tissue (hematoma) may be painful to the touch. It should usually decrease in size and tenderness within 1 to 2 weeks.  SEEK IMMEDIATE MEDICAL CARE IF: You have unusual pain at the radial site.  You have redness, warmth, swelling, or pain at the radial site.  You have drainage (other than a small amount of blood on the dressing).  You have chills.  You have a fever or persistent symptoms for more than 72 hours.  You have a fever and your symptoms suddenly get worse.  Your arm becomes pale, cool, tingly, or numb.  You have heavy bleeding from the site. Hold pressure on the site.   Increase activity slowly   Complete by: As directed       Discharge Medications   Allergies as of 11/20/2019      Reactions   Bee Venom Anaphylaxis   Simvastatin    Myopathy   Codeine Nausea Only   Evista [raloxifene]    Hot flashes   Percocet [oxycodone-acetaminophen] Nausea And Vomiting      Medication List    STOP taking these medications   metoprolol tartrate 50 MG tablet Commonly known as: LOPRESSOR     TAKE these medications   acetaminophen 325 MG tablet Commonly known as: TYLENOL Take 325 mg by mouth every 6 (six) hours as needed for moderate pain.   aspirin EC 81 MG tablet Take 1 tablet (81 mg total) by mouth daily. Swallow whole.   CALCIUM + D PO Take 1 tablet by mouth daily.   clopidogrel 75 MG tablet Commonly known as: PLAVIX Take 1 tablet (75 mg total) by mouth daily with breakfast. Start taking on: November 21, 2019   ezetimibe 10 MG tablet Commonly known as: ZETIA TAKE 1 TABLET DAILY   famotidine 20 MG tablet Commonly known as: PEPCID Take 20 mg by mouth 2 (two) times daily.   metoprolol succinate 25 MG 24 hr tablet Commonly known as: TOPROL-XL Take 1 tablet (25 mg total) by mouth daily.   nitroGLYCERIN 0.4 MG  SL tablet Commonly known as:  Nitrostat Place 1 tablet (0.4 mg total) under the tongue every 5 (five) minutes as needed for chest pain.   REFRESH TEARS OP Place 1 drop into both eyes daily.   rosuvastatin 40 MG tablet Commonly known as: CRESTOR TAKE 1 TABLET DAILY   sertraline 50 MG tablet Commonly known as: ZOLOFT TAKE 1 TABLET DAILY (NEW DOSE) What changed: See the new instructions.   sodium chloride 0.65 % Soln nasal spray Commonly known as: OCEAN Place 1 spray into both nostrils daily.   traMADol 50 MG tablet Commonly known as: ULTRAM Take 1 tablet (50 mg total) by mouth as needed. What changed:   when to take this  reasons to take this   Vitamin D 50 MCG (2000 UT) tablet Take 2,000 Units by mouth daily.        Aspirin prescribed at discharge?  Yes High Intensity Statin Prescribed? (Lipitor 40-80mg  or Crestor 20-40mg ): Yes Beta Blocker Prescribed? Yes For EF <40%, was ACEI/ARB Prescribed? No: EF ok ADP Receptor Inhibitor Prescribed? (i.e. Plavix etc.-Includes Medically Managed Patients): Yes For EF <40%, Aldosterone Inhibitor Prescribed? No: EF ok Was EF assessed during THIS hospitalization? Yes Was Cardiac Rehab II ordered? (Included Medically managed Patients): Yes   Outstanding Labs/Studies   Referral to lipid clinic placed at discharge  Duration of Discharge Encounter   Greater than 30 minutes including physician time.  Signed, Reino Bellis NP-C 11/20/2019, 9:35 AM   I have examined the patient and reviewed assessment and plan and discussed with patient.  Agree with above as stated.    Right radial site without hematoma.  2+ right radial pulse.  Right groin site stable as well.  Cath films reviewed.  Brisk left to right collaterals present.    Walk with rehab.  Increase medical therapy.  F/u with Dr. Gwenlyn Found.  OK for discharge today.  Larae Grooms

## 2019-11-20 NOTE — Progress Notes (Addendum)
Progress Note  Patient Name: Barbara Maxwell Date of Encounter: 11/20/2019  [facility] Cardiologist: Quay Burow, MD   Subjective   No chest pain this morning.   Inpatient Medications    Scheduled Meds: . aspirin  81 mg Oral Daily  . clopidogrel  75 mg Oral Q breakfast  . ezetimibe  10 mg Oral Daily  . metoprolol succinate  25 mg Oral Daily  . rosuvastatin  40 mg Oral Daily  . sertraline  50 mg Oral Daily  . sodium chloride flush  3 mL Intravenous Q12H   Continuous Infusions: . sodium chloride     PRN Meds: sodium chloride, acetaminophen, ondansetron (ZOFRAN) IV, sodium chloride flush   Vital Signs    Vitals:   11/19/19 1630 11/19/19 1645 11/19/19 2007 11/20/19 0622  BP: (!) 119/52 (!) 118/96 (!) 128/53 124/64  Pulse: 62 65 68 93  Resp: 14 16 18 16   Temp:   98.7 F (37.1 C) 98.3 F (36.8 C)  TempSrc:   Oral Oral  SpO2: 98% 99% 98% 99%  Weight:    64 kg  Height:        Intake/Output Summary (Last 24 hours) at 11/20/2019 0839 Last data filed at 11/20/2019 0801 Gross per 24 hour  Intake 363 ml  Output --  Net 363 ml   Last 3 Weights 11/20/2019 11/19/2019 11/16/2019  Weight (lbs) 141 lb 140 lb 144 lb 6.4 oz  Weight (kg) 63.957 kg 63.504 kg 65.499 kg      Telemetry    SR - Personally Reviewed  ECG    SR 72 bpm - Personally Reviewed  Physical Exam  Pleasant older WF, sitting up in bed GEN: No acute distress.   Neck: No JVD Cardiac: RRR, no murmurs, rubs, or gallops.  Respiratory: Clear to auscultation bilaterally. GI: Soft, nontender, non-distended  MS: No edema; No deformity. Right radial cath site stable.  Neuro:  Nonfocal  Psych: Normal affect   Labs    High Sensitivity Troponin:  No results for input(s): TROPONINIHS in the last 720 hours.    Chemistry Recent Labs  Lab 11/16/19 1151 11/20/19 0238  NA 141 140  K 4.8 3.8  CL 103 109  CO2 23 20*  GLUCOSE 89 94  BUN 13 13  CREATININE 0.85 0.77  CALCIUM 9.7 8.8*  GFRNONAA 64 >60   GFRAA 74 >60  ANIONGAP  --  11     Hematology Recent Labs  Lab 11/16/19 1151 11/20/19 0238  WBC 6.6 6.2  RBC 4.45 4.14  HGB 10.4* 9.6*  HCT 34.0 32.3*  MCV 76* 78.0*  MCH 23.4* 23.2*  MCHC 30.6* 29.7*  RDW 15.6* 16.3*  PLT 325 281    BNPNo results for input(s): BNP, PROBNP in the last 168 hours.   DDimer No results for input(s): DDIMER in the last 168 hours.   Radiology    CARDIAC CATHETERIZATION  Result Date: 11/19/2019  Ost RCA to Prox RCA lesion is 99% stenosed.  Prox LAD to Mid LAD lesion is 60% stenosed.  Barbara Maxwell is a 84 y.o. female  [id number] LOCATION:  [facility] PHYSICIAN: Quay Burow, M.D. Nov 22, 1935 DATE OF PROCEDURE:  11/19/2019 DATE OF DISCHARGE: CARDIAC CATHETERIZATION / Aborted PCI Ostial RCA History obtained from chart review.Barbara Maxwell is a 84 y.o.  mildly overweight widowed Caucasian female (husband of 10 years recently passed away), mother of 3 living children (daughter 47 committed suicide), grandmother 6 grandchildren referred by Dr. Fatima Maxwell cardiovascular valuation  because of atypical chest pain and dyspnea.  I last saw her in the office 09/28/2019. Risk factors are only notable for hyperlipidemia treated. Her father apparently did have bypass surgery and she had a son who had a stent. She is never had a heart attack or stroke. She is fairly active and does water aerobics 2 days/week. She is noticed some dyspnea walking up an incline recently and some atypical chest pain. She does admit to anxiety since she is living alone currently.  I obtain a 2D echocardiogram on 10/15/2019 which was essentially normal with grade 1 diastolic dysfunction. A coronary calcium score was measured at 1681 with suggestion of proximal to mid RCA disease and LAD disease. PROCEDURE DESCRIPTION: The patient was brought to the second floor [facility] cath lab in the postabsorptive state.  She was not premedicated .  Her right wrist was prepped and shaved in  usual sterile fashion. Xylocaine 1% was used for local anesthesia. A 6 French sheath was inserted into the right radial artery using standard Seldinger technique.  Patient received 3000's of IV heparin.  He is a 5 [place] TIG catheter was used for selective coronary angiography and obtain left heart pressures.  Isovue dye was used for the entirety of the case.  Retrograde aorta, ventricular and pullback pressures were recorded.  Radial cocktail was administered via the SideArm sheath. The patient received an additional 4000 units of heparin in addition to 3000.  The ending ACT was 252.  The patient received Plavix 60 mg p.o. followed by Pepcid 20 mg IV.  On the podiatrist for the entirety of the intervention.  A 5 [place] balloontipped temporary transvenous pacemaker was then floated into the RV apex and paced MA of 5. The decision was made to perform orbital atherectomy of the ostium of the RCA.  There were grade 2-3 left-to-right collaterals.  Using a 6 [place] JR4 guide catheter along with a 300 cm 014 Fielder XT wire I was able to cross the lesion but was never convinced that I was in the true lumen beyond the proximal part of the vessel.  It did select several small branches.  After multiple attempts to find the true lumen I aborted the procedure.  Patient remained stable throughout the case.  The guidewire and guide catheter were removed.  The temper transvenous pacemaker was removed.  The radial sheath was removed and a TR band was placed in the right wrist to achieve patent hemostasis.  The femoral sheath was removed and pressure was held.  Patient left lab in stable condition.   Barbara Maxwell has moderate proximal LAD disease after the first moderate sized first diagonal branch and high-grade subtotally occluded ostial dominant RCA stenosis with heavy calcification and grade 2 3 left-to-right collaterals.  Her echo revealed normal LV function.  Plan will be to begin her on a good antianginal regimen.  If she  has continued effort angina despite being on adequate therapeutic doses of medications we will contemplate readdressing her ostial RCA.  Quay Burow. MD, [facility] 11/19/2019 2:56 PM    Cardiac Studies   Cath: 11/19/19  IMPRESSION: Ms. Archbold has moderate proximal LAD disease after the first moderate sized first diagonal branch and high-grade subtotally occluded ostial dominant RCA stenosis with heavy calcification and grade 2 3 left-to-right collaterals.  Her echo revealed normal LV function.  Plan will be to begin her on a good antianginal regimen.  If she has continued effort angina despite being on adequate therapeutic doses  of medications we will contemplate readdressing her ostial RCA.   Quay Burow. MD, [facility]   Patient Profile     84 y.o. female with PMH of HLD, HTN, depression who presented for outpatient cardiac cath after having an abnormal calcium score of 1681.   Assessment & Plan    1. CAD: underwent cardiac cath noted above with subtotally occluded ostial RCA with left to right collaterals, with moderate pLAD disease after 1st OM. Given the vessel is heavily calcified and multiple attempts made to treat, will plan for medical therapy. Consider readdressing if fails medical therapy. Normal LV function on recent echo -- on ASA, plavix, statin, zetia. Will add low dose Toprol 25mg  daily -- ambulate with cardiac rehab, possible dc this afternoon  2. HLD: on crestor 40mg  and zetia 10mg . Last LDL 4/21 was 79. Will plan to refer to lipid clinic at discharge for possible PCSK9.  3. HTN: Blood pressures have been elevated. Adding Toprol as above  For questions or updates, please contact [facility] Please consult [email or web address] for contact info under        Signed, Reino Bellis, NP  11/20/2019, 8:39 AM    I have examined the patient and reviewed assessment and plan and discussed with patient.  Agree with above as stated.    Right radial site without hematoma.  2+ right  radial pulse.  Right groin site stable as well.  Cath films reviewed.  Brisk left to right collaterals present.    Walk with rehab.  Increase medical therapy.  F/u with Dr. Gwenlyn Found.  Larae Grooms

## 2019-11-20 NOTE — Discharge Instructions (Signed)
Dyslipidemia Dyslipidemia is an imbalance of waxy, fat-like substances (lipids) in the blood. The body needs lipids in small amounts. Dyslipidemia often involves a high level of cholesterol or triglycerides, which are types of lipids. Common forms of dyslipidemia include:  High levels of LDL cholesterol. LDL is the type of cholesterol that causes fatty deposits (plaques) to build up in the blood vessels that carry blood away from your heart (arteries).  Low levels of HDL cholesterol. HDL cholesterol is the type of cholesterol that protects against heart disease. High levels of HDL remove the LDL buildup from arteries.  High levels of triglycerides. Triglycerides are a fatty substance in the blood that is linked to a buildup of plaques in the arteries. What are the causes? Primary dyslipidemia is caused by changes (mutations) in genes that are passed down through families (inherited). These mutations cause several types of dyslipidemia. Secondary dyslipidemia is caused by lifestyle choices and diseases that lead to dyslipidemia, such as:  Eating a diet that is high in animal fat.  Not getting enough exercise.  Having diabetes, kidney disease, liver disease, or thyroid disease.  Drinking large amounts of alcohol.  Using certain medicines. What increases the risk? You are more likely to develop this condition if you are an older man or if you are a woman who has gone through menopause. Other risk factors include:  Having a family history of dyslipidemia.  Taking certain medicines, including birth control pills, steroids, some diuretics, and beta-blockers.  Smoking cigarettes.  Eating a high-fat diet.  Having certain medical conditions such as diabetes, polycystic ovary syndrome (PCOS), kidney disease, liver disease, or hypothyroidism.  Not exercising regularly.  Being overweight or obese with too much belly fat. What are the signs or symptoms? In most cases, dyslipidemia does  not usually cause any symptoms. In severe cases, very high lipid levels can cause:  Fatty bumps under the skin (xanthomas).  White or gray ring around the black center (pupil) of the eye. Very high triglyceride levels can cause inflammation of the pancreas (pancreatitis). How is this diagnosed? Your health care provider may diagnose dyslipidemia based on a routine blood test (fasting blood test). Because most people do not have symptoms of the condition, this blood testing (lipid profile) is done on adults age 21 and older and is repeated every 5 years. This test checks:  Total cholesterol. This measures the total amount of cholesterol in your blood, including LDL cholesterol, HDL cholesterol, and triglycerides. A healthy number is below 200.  LDL cholesterol. The target number for LDL cholesterol is different for each person, depending on individual risk factors. Ask your health care provider what your LDL cholesterol should be.  HDL cholesterol. An HDL level of 60 or higher is best because it helps to protect against heart disease. A number below 87 for men or below 63 for women increases the risk for heart disease.  Triglycerides. A healthy triglyceride number is below 150. If your lipid profile is abnormal, your health care provider may do other blood tests. How is this treated? Treatment depends on the type of dyslipidemia that you have and your other risk factors for heart disease and stroke. Your health care provider will have a target range for your lipid levels based on this information. For many people, this condition may be treated by lifestyle changes, such as diet and exercise. Your health care provider may recommend that you:  Get regular exercise.  Make changes to your diet.  Quit smoking if  you smoke. If diet changes and exercise do not help you reach your goals, your health care provider may also prescribe medicine to lower lipids. The most commonly prescribed type of  medicine lowers your LDL cholesterol (statin drug). If you have a high triglyceride level, your provider may prescribe another type of drug (fibrate) or an omega-3 fish oil supplement, or both. Follow these instructions at home:  Eating and drinking  Follow instructions from your health care provider or dietitian about eating or drinking restrictions.  Eat a healthy diet as told by your health care provider. This can help you reach and maintain a healthy weight, lower your LDL cholesterol, and raise your HDL cholesterol. This may include: ? Limiting your calories, if you are overweight. ? Eating more fruits, vegetables, whole grains, fish, and lean meats. ? Limiting saturated fat, trans fat, and cholesterol.  If you drink alcohol: ? Limit how much you use. ? Be aware of how much alcohol is in your drink. In the [place] one drink equals one 12 oz bottle of beer (355 mL), one 5 oz glass of wine (148 mL), or one 1 oz glass of hard liquor (44 mL).  Do not drink alcohol if: ? Your health care provider tells you not to drink. ? You are pregnant, may be pregnant, or are planning to become pregnant. Activity  Get regular exercise. Start an exercise and strength training program as told by your health care provider. Ask your health care provider what activities are safe for you. Your health care provider may recommend: ? 30 minutes of aerobic activity 4-6 days a week. Brisk walking is an example of aerobic activity. ? Strength training 2 days a week. General instructions  Do not use any products that contain nicotine or tobacco, such as cigarettes, e-cigarettes, and chewing tobacco. If you need help quitting, ask your health care provider.  Take over-the-counter and prescription medicines only as told by your health care provider. This includes supplements.  Keep all follow-up visits as told by your health care provider. Contact a health care provider if:  You are: ? Having trouble sticking  to your exercise or diet plan. ? Struggling to quit smoking or control your use of alcohol. Summary  Dyslipidemia often involves a high level of cholesterol or triglycerides, which are types of lipids.  Treatment depends on the type of dyslipidemia that you have and your other risk factors for heart disease and stroke.  For many people, treatment starts with lifestyle changes, such as diet and exercise.  Your health care provider may prescribe medicine to lower lipids. This information is not intended to replace advice given to you by your health care provider. Make sure you discuss any questions you have with your health care provider. Document Revised: 10/03/2017 Document Reviewed: 09/09/2017 Elsevier Patient Education  [place] about your medication: Plavix (anti-platelet agent)  Generic Name (Brand): clopidogrel (Plavix), once daily medication  PURPOSE: You are taking this medication along with aspirin to lower your chance of having a heart attack, stroke, or blood clots in your heart stent. These can be fatal. Plavix and aspirin help prevent platelets from sticking together and forming a clot that can block an artery or your stent.   Common SIDE EFFECTS you may experience include: bruising or bleeding more easily, shortness of breath  Do not stop taking PLAVIX without talking to the doctor who prescribes it for you. People who are treated with a stent and stop taking Plavix too  soon, have a higher risk of getting a blood clot in the stent, having a heart attack, or dying. If you stop Plavix because of bleeding, or for other reasons, your risk of a heart attack or stroke may increase.   Avoid taking NSAID agents or anti-inflammatory medications such as ibuprofen, naproxen given increased bleed risk with plavix - can use acetaminophen (Tylenol) if needed for pain.  Avoid taking over the counter stomach medications omeprazole (Prilosec) or esomeprazole (Nexium)  since these do interact and make plavix less effective - ask your pharmacist or doctor for alterative agents if needed for heartburn or GERD.   Tell all of your doctors and dentists that you are taking Plavix. They should talk to the doctor who prescribed Plavix for you before you have any surgery or invasive procedure.   Contact your health care provider if you experience: severe or uncontrollable bleeding, pink/red/brown urine, vomiting blood or vomit that looks like "coffee grounds", red or black stools (looks like tar), coughing up blood or blood clots ----------------------------------------------------------------------------------------------------------------------

## 2019-11-20 NOTE — Progress Notes (Signed)
CARDIAC REHAB PHASE I   PRE:  Rate/Rhythm: 70 SR  BP:  Supine: 132/60  Sitting:   Standing:    SaO2: 97%RA  MODE:  Ambulation: 470 ft   POST:  Rate/Rhythm: 111 ST  BP:  Supine: 145/58  Sitting:   Standing:    SaO2: 97%RA 0845-0950 Pt walked 470 ft on RA with steady gait and no CP. Tolerated well. Gave pt heart healthy diet and encouraged her to watch carbs as last A1C at 6.0.  Reviewed NTG use and encouraged plavix. Pt knows to let cardiologist know if she has any CP. Discussed CRP 2 if she was interested and could talk to cardiologist for stable angina diagnosis, since pt did not have intervention. Pt plans to begin walking program at track and continue water aerobics. Pt knows not to restart water aerobics until seen by cardiology due to groin stick. Also encouraged her to start walking slowly and add minutes as she tolerates. Understanding voiced by pt.    Graylon Good, RN BSN  11/20/2019 9:46 AM

## 2019-11-23 ENCOUNTER — Other Ambulatory Visit: Payer: Self-pay

## 2019-11-27 ENCOUNTER — Other Ambulatory Visit: Payer: Self-pay

## 2019-11-27 ENCOUNTER — Encounter: Payer: Self-pay | Admitting: Cardiovascular Disease

## 2019-11-27 ENCOUNTER — Ambulatory Visit (INDEPENDENT_AMBULATORY_CARE_PROVIDER_SITE_OTHER): Payer: Medicare Other | Admitting: Cardiovascular Disease

## 2019-11-27 DIAGNOSIS — I10 Essential (primary) hypertension: Secondary | ICD-10-CM | POA: Diagnosis not present

## 2019-11-27 DIAGNOSIS — E782 Mixed hyperlipidemia: Secondary | ICD-10-CM

## 2019-11-27 DIAGNOSIS — R0789 Other chest pain: Secondary | ICD-10-CM | POA: Diagnosis not present

## 2019-11-27 DIAGNOSIS — R931 Abnormal findings on diagnostic imaging of heart and coronary circulation: Secondary | ICD-10-CM | POA: Diagnosis not present

## 2019-11-27 NOTE — Assessment & Plan Note (Signed)
History of atypical chest pain with a coronary CTA revealing elevated coronary calcium score of sixteen eighty-one with suggestion of disease in the proximal RCA and LAD.  Based on this she underwent outpatient cardiac catheterization by myself 11/19/2019 revealing a 60% LAD just beyond the first diagonal branch and a 99% calcified ostial RCA.  There were grade 2-3 left-to-right collaterals.  I did attempt to perform orbital atherectomy and stenting.  The procedure was done radially.  Did place a temporary transvenous pacemaker in the right common femoral vein.  Unfortunately I was not able to convince myself that the wire was intraluminal and therefore aborted the procedure.  My plan was to treat her medically knowing that she had good collaterals to the RCA and preserved intervention for recalcitrant symptoms.  Since being at home she is had one episode of atypical chest pain requiring sublingual nitroglycerin.

## 2019-11-27 NOTE — Progress Notes (Signed)
11/27/2019 Barbara Maxwell   09-28-1935  [id number]  Primary Physician [name], [place] Primary Cardiologist: Lorretta Harp MD Lupe Carney, [place]  HPI:  Barbara Maxwell is a 84 y.o.    mildly overweight widowed Caucasian female (husband of 76 years recently passed away), mother of 3 living children (daughter 15 committed suicide), grandmother 6 grandchildren referred by Dr. Fatima Blank cardiovascular valuation because of atypical chest pain and dyspnea.  I last saw her in the office 11/16/2019. Risk factors are only notable for hyperlipidemia treated. Her father apparently did have bypass surgery and she had a son who had a stent. She is never had a heart attack or stroke. She is fairly active and does water aerobics 2 days/week. She is noticed some dyspnea walking up an incline recently and some atypical chest pain. She does admit to anxiety since she is living alone currently.  I obtained a 2D echocardiogram on 10/15/2019 which was essentially normal with grade 1 diastolic dysfunction. A coronary calcium score was measured at 1681 with suggestion of proximal to mid RCA disease and LAD disease.  Based on this I performed outpatient diagnostic coronary angiography on her 11/19/2019 with a right radial approach revealing a 60% proximal LAD lesion just after the first diagonal branch which I did not think appeared physiologically significant and a 99% calcified ostial RCA with grade 2-3 left-to-right collaterals.  I placed a temporary transvenous pacemaker via the right common femoral vein and did attempt orbital atherectomy PCI and stenting however I was able to cross with a wire but was unable to confirm that I was intraluminal and therefore aborted the procedure.  My plan was to treat her medically initially with intentional medications were history of intervention for recalcitrant symptoms.   Current Meds  Medication Sig  . acetaminophen (TYLENOL) 325 MG tablet Take 325 mg by  mouth every 6 (six) hours as needed for moderate pain.   [name] aspirin EC 81 MG tablet Take 1 tablet (81 mg total) by mouth daily. Swallow whole.  . Calcium Carbonate-Vitamin D (CALCIUM + D PO) Take 1 tablet by mouth daily.  . Carboxymethylcellulose Sodium (REFRESH TEARS OP) Place 1 drop into both eyes daily.  . Cholecalciferol (VITAMIN D) 50 MCG (2000 UT) tablet Take 2,000 Units by mouth daily.  . clopidogrel (PLAVIX) 75 MG tablet Take 1 tablet (75 mg total) by mouth daily with breakfast.  . ezetimibe (ZETIA) 10 MG tablet TAKE 1 TABLET DAILY (Patient taking differently: Take 10 mg by mouth daily. )  . famotidine (PEPCID) 20 MG tablet Take 20 mg by mouth 2 (two) times daily.  . metoprolol succinate (TOPROL-XL) 25 MG 24 hr tablet Take 25 mg by mouth daily.  . nitroGLYCERIN (NITROSTAT) 0.4 MG SL tablet Place 1 tablet (0.4 mg total) under the tongue every 5 (five) minutes as needed for chest pain.  . rosuvastatin (CRESTOR) 40 MG tablet TAKE 1 TABLET DAILY (Patient taking differently: Take 40 mg by mouth daily. )  . sertraline (ZOLOFT) 50 MG tablet TAKE 1 TABLET DAILY (NEW DOSE) (Patient taking differently: Take 50 mg by mouth daily. )  . sodium chloride (OCEAN) 0.65 % SOLN nasal spray Place 1 spray into both nostrils daily.  . traMADol (ULTRAM) 50 MG tablet Take 1 tablet (50 mg total) by mouth as needed. (Patient taking differently: Take 50 mg by mouth daily as needed for moderate pain. )     Allergies  Allergen Reactions  . Bee Venom  Anaphylaxis  . Simvastatin     Myopathy  . Codeine Nausea Only  . Evista [Raloxifene]     Hot flashes  . Percocet [Oxycodone-Acetaminophen] Nausea And Vomiting    Social History   Socioeconomic History  . Marital status: Widowed    Spouse name: Not on file  . Number of children: Not on file  . Years of education: Not on file  . Highest education level: Not on file  Occupational History  . Not on file  Tobacco Use  . Smoking status: Never Smoker  .  Smokeless tobacco: Never Used  Vaping Use  . Vaping Use: Never used  Substance and Sexual Activity  . Alcohol use: No    Alcohol/week: 0.0 standard drinks  . Drug use: Not on file  . Sexual activity: Not Currently    Comment: 1st intercourse- 20, partners- 2, widow  Other Topics Concern  . Not on file  Social History Narrative  . Not on file   Social Determinants of Health   Financial Resource Strain:   . Difficulty of Paying Living Expenses: Not on file  Food Insecurity:   . Worried About Charity fundraiser in the Last Year: Not on file  . Ran Out of Food in the Last Year: Not on file  Transportation Needs:   . Lack of Transportation (Medical): Not on file  . Lack of Transportation (Non-Medical): Not on file  Physical Activity:   . Days of Exercise per Week: Not on file  . Minutes of Exercise per Session: Not on file  Stress:   . Feeling of Stress : Not on file  Social Connections:   . Frequency of Communication with Friends and Family: Not on file  . Frequency of Social Gatherings with Friends and Family: Not on file  . Attends Religious Services: Not on file  . Active Member of Clubs or Organizations: Not on file  . Attends Archivist Meetings: Not on file  . Marital Status: Not on file  Intimate Partner Violence:   . Fear of Current or Ex-Partner: Not on file  . Emotionally Abused: Not on file  . Physically Abused: Not on file  . Sexually Abused: Not on file     Review of Systems: General: negative for chills, fever, night sweats or weight changes.  Cardiovascular: negative for chest pain, dyspnea on exertion, edema, orthopnea, palpitations, paroxysmal nocturnal dyspnea or shortness of breath Dermatological: negative for rash Respiratory: negative for cough or wheezing Urologic: negative for hematuria Abdominal: negative for nausea, vomiting, diarrhea, bright red blood per rectum, melena, or hematemesis Neurologic: negative for visual changes, syncope,  or dizziness All other systems reviewed and are otherwise negative except as noted above.    Blood pressure (!) 118/56, pulse 64, height 5\' 3"  (1.6 m), weight 143 lb 9.6 oz (65.1 kg).  General appearance: alert and no distress Neck: no adenopathy, no carotid bruit, no JVD, supple, symmetrical, trachea midline and thyroid not enlarged, symmetric, no tenderness/mass/nodules Lungs: clear to auscultation bilaterally Heart: regular rate and rhythm, S1, S2 normal, no murmur, click, rub or gallop Extremities: extremities normal, atraumatic, no cyanosis or edema Pulses: 2+ and symmetric Skin: Ecchymosis right groin without hematoma Neurologic: Alert and oriented X 3, normal strength and tone. Normal symmetric reflexes. Normal coordination and gait  EKG not performed today  ASSESSMENT AND PLAN:   Essential hypertension History of essential hypertension a blood pressure measured today at 118/56.  She is on metoprolol.  Hyperlipidemia History of  hyperlipidemia on high-dose Crestor and Zetia with a lipid profile performed 11/12/2019 revealing total cholesterol of one hundred and nineteen, LDL of fifty-one and HDL forty-five.  Atypical chest pain History of atypical chest pain with a coronary CTA revealing elevated coronary calcium score of sixteen eighty-one with suggestion of disease in the proximal RCA and LAD.  Based on this she underwent outpatient cardiac catheterization by myself 11/19/2019 revealing a 60% LAD just beyond the first diagonal branch and a 99% calcified ostial RCA.  There were grade 2-3 left-to-right collaterals.  I did attempt to perform orbital atherectomy and stenting.  The procedure was done radially.  Did place a temporary transvenous pacemaker in the right common femoral vein.  Unfortunately I was not able to convince myself that the wire was intraluminal and therefore aborted the procedure.  My plan was to treat her medically knowing that she had good collaterals to the RCA and  preserved intervention for recalcitrant symptoms.  Since being at home she is had one episode of atypical chest pain requiring sublingual nitroglycerin.      Lorretta Harp MD FACP,FACC,FAHA, [facility] 11/27/2019 11:16 AM

## 2019-11-27 NOTE — Assessment & Plan Note (Signed)
History of essential hypertension a blood pressure measured today at 118/56.  She is on metoprolol.

## 2019-11-27 NOTE — Patient Instructions (Signed)
  Follow-Up: At [facility], you and your health needs are our priority.  As part of our continuing mission to provide you with exceptional heart care, we have created designated Provider Care Teams.  These Care Teams include your primary Cardiologist (physician) and Advanced Practice Providers (APPs -  Physician Assistants and Nurse Practitioners) who all work together to provide you with the care you need, when you need it.  We recommend signing up for the patient portal called "MyChart".  Sign up information is provided on this After Visit Summary.  MyChart is used to connect with patients for Virtual Visits (Telemedicine).  Patients are able to view lab/test results, encounter notes, upcoming appointments, etc.  Non-urgent messages can be sent to your provider as well.   To learn more about what you can do with MyChart, go to [email or web address].    Your next appointment:   3 month(s)  The format for your next appointment:   In Person  Provider:   You may see Quay Burow, MD or one of the following Advanced Practice Providers on your designated Care Team:    Kerin Ransom, PA-C  [place], [place]  Coletta Memos, [place]

## 2019-11-27 NOTE — Assessment & Plan Note (Addendum)
History of hyperlipidemia on high-dose Crestor and Zetia with a lipid profile performed 11/12/2019 revealing total cholesterol of one hundred and nineteen, LDL of fifty-one and HDL forty-five.

## 2019-12-11 ENCOUNTER — Other Ambulatory Visit: Payer: Self-pay

## 2019-12-11 ENCOUNTER — Ambulatory Visit (INDEPENDENT_AMBULATORY_CARE_PROVIDER_SITE_OTHER): Payer: Medicare Other | Admitting: Family

## 2019-12-11 ENCOUNTER — Ambulatory Visit (INDEPENDENT_AMBULATORY_CARE_PROVIDER_SITE_OTHER): Payer: Medicare Other

## 2019-12-11 ENCOUNTER — Other Ambulatory Visit: Payer: Self-pay | Admitting: Cardiovascular Disease

## 2019-12-11 VITALS — BP 120/62 | HR 77 | Temp 98.4°F | Ht 63.0 in | Wt 142.6 lb

## 2019-12-11 DIAGNOSIS — Z8781 Personal history of (healed) traumatic fracture: Secondary | ICD-10-CM | POA: Diagnosis not present

## 2019-12-11 DIAGNOSIS — S2232XA Fracture of one rib, left side, initial encounter for closed fracture: Secondary | ICD-10-CM | POA: Diagnosis not present

## 2019-12-11 MED ORDER — CLOPIDOGREL BISULFATE 75 MG PO TABS
75.0000 mg | ORAL_TABLET | Freq: Every day | ORAL | 2 refills | Status: DC
Start: 2019-12-11 — End: 2020-08-19

## 2019-12-11 MED ORDER — METOPROLOL SUCCINATE ER 25 MG PO TB24
25.0000 mg | ORAL_TABLET | Freq: Every day | ORAL | 2 refills | Status: DC
Start: 2019-12-11 — End: 2020-08-19

## 2019-12-11 NOTE — Telephone Encounter (Signed)
   *  STAT* If patient is at the pharmacy, call can be transferred to refill team.   1. Which medications need to be refilled? (please list name of each medication and dose if known)   clopidogrel (PLAVIX) 75 MG tablet    metoprolol succinate (TOPROL-XL) 25 MG 24 hr tablet    2. Which pharmacy/location (including street and city if local pharmacy) is medication to be sent to? [facility]  3. Do they need a 30 day or 90 day supply? 90 days   Pt only have 1 week worth of medications. [facility] her name is Barbara Maxwell [facility]

## 2019-12-11 NOTE — Progress Notes (Signed)
Barbara Maxwell is a 84 y.o. female with the following history as recorded in EpicCare:  Patient Active Problem List   Diagnosis Date Noted  . CAD (coronary artery disease) 11/19/2019  . Atypical chest pain 09/28/2019  . Dyspnea on exertion 09/28/2019  . H/O vitamin D deficiency 01/21/2015  . Osteopenia 01/21/2015  . Vaginal atrophy 01/21/2015  . Mixed hyperlipidemia 02/04/2014  . Family history of ovarian cancer 11/13/2012  . Rectocele 11/07/2012  . Hyperlipidemia 09/29/2012  . Essential hypertension 08/07/2009  . ALLERGIC RHINITIS 08/07/2009  . HYPERSOMNIA WITH SLEEP APNEA UNSPECIFIED 08/04/2009    Current Outpatient Medications  Medication Sig Dispense Refill  . acetaminophen (TYLENOL) 325 MG tablet Take 325 mg by mouth every 6 (six) hours as needed for moderate pain.     [name] aspirin EC 81 MG tablet Take 1 tablet (81 mg total) by mouth daily. Swallow whole. 90 tablet 3  . Calcium Carbonate-Vitamin D (CALCIUM + D PO) Take 1 tablet by mouth daily.    . Carboxymethylcellulose Sodium (REFRESH TEARS OP) Place 1 drop into both eyes daily.    . Cholecalciferol (VITAMIN D) 50 MCG (2000 UT) tablet Take 2,000 Units by mouth daily.    . clopidogrel (PLAVIX) 75 MG tablet Take 1 tablet (75 mg total) by mouth daily with breakfast. 90 tablet 3  . ezetimibe (ZETIA) 10 MG tablet TAKE 1 TABLET DAILY (Patient taking differently: Take 10 mg by mouth daily. ) 90 tablet 3  . famotidine (PEPCID) 20 MG tablet Take 20 mg by mouth 2 (two) times daily.    . metoprolol succinate (TOPROL-XL) 25 MG 24 hr tablet Take 25 mg by mouth daily.    . nitroGLYCERIN (NITROSTAT) 0.4 MG SL tablet Place 1 tablet (0.4 mg total) under the tongue every 5 (five) minutes as needed for chest pain. 25 tablet 3  . rosuvastatin (CRESTOR) 40 MG tablet TAKE 1 TABLET DAILY (Patient taking differently: Take 40 mg by mouth daily. ) 90 tablet 3  . sertraline (ZOLOFT) 50 MG tablet TAKE 1 TABLET DAILY (NEW DOSE) (Patient taking differently:  Take 50 mg by mouth daily. ) 90 tablet 1  . sodium chloride (OCEAN) 0.65 % SOLN nasal spray Place 1 spray into both nostrils daily.     No current facility-administered medications for this visit.    Allergies: Bee venom, Simvastatin, Codeine, Evista [raloxifene], and Percocet [oxycodone-acetaminophen]  Past Medical History:  Diagnosis Date  . Cancer ([place])    SMALL PLACE-  MELANOMA REMOVED FROM BACK   . Coronary artery disease   . Depression   . Hypercholesterolemia   . Hypertension     Past Surgical History:  Procedure Laterality Date  . ABDOMINAL HYSTERECTOMY     VAGINAL HYSTERECTOMY WITH OVARIAN PRESERVATION   . ABDOMINOPLASTY  1998  . APPENDECTOMY    . BREAST SURGERY  1985   BREAST REDUCTION   . CARDIAC CATHETERIZATION  11/19/2019  . CORONARY BALLOON ANGIOPLASTY N/A 11/19/2019   Procedure: CORONARY BALLOON ANGIOPLASTY;  Surgeon: Lorretta Harp, MD;  Location: [facility];  Service: Cardiovascular;  Laterality: N/A;  . LEFT HEART CATH AND CORONARY ANGIOGRAPHY N/A 11/19/2019   Procedure: LEFT HEART CATH AND CORONARY ANGIOGRAPHY;  Surgeon: Lorretta Harp, MD;  Location: [facility];  Service: Cardiovascular;  Laterality: N/A;  . TONSILLECTOMY AND ADENOIDECTOMY      Family History  Problem Relation Age of Onset  . Hypertension Mother   . Heart disease Father   . Diabetes  Father   . Cancer Sister 72       OVARIAN   . Thyroid disease Sister   . Heart disease Brother   . Thyroid disease Daughter   . Diabetes Paternal Grandmother     Social History   Tobacco Use  . Smoking status: Never Smoker  . Smokeless tobacco: Never Used  Substance Use Topics  . Alcohol use: No    Alcohol/week: 0.0 standard drinks    Subjective:  Requesting updated X-ray to follow-up on rib fracture; notes she is feeling better; no chest pain or shortness of breath;  Is planning to get COVID booster soon;  Scheduled for follow-up with cardiology in January 2022;    Objective:  Vitals:   12/11/19 1223  BP: 120/62  Pulse: 77  Temp: 98.4 F (36.9 C)  TempSrc: Oral  SpO2: 98%  Weight: 142 lb 9.6 oz (64.7 kg)  Height: 5\' 3"  (1.6 m)    General: Well developed, well nourished, in no acute distress  Skin : Warm and dry.  Head: Normocephalic and atraumatic  Lungs: Respirations unlabored; clear to auscultation bilaterally without wheeze, rales, rhonchi  Neurologic: Alert and oriented; speech intact; face symmetrical; moves all extremities well; CNII-XII intact without focal deficit   Assessment:  1. History of rib fracture     Plan:  Update x ray today; follow-up to be determined;  Plan for yearly exam in May 2022;  This visit occurred during the SARS-CoV-2 public health emergency.  Safety protocols were in place, including screening questions prior to the visit, additional usage of staff PPE, and extensive cleaning of exam room while observing appropriate contact time as indicated for disinfecting solutions.     No follow-ups on file.  Orders Placed This Encounter  Procedures  . DG Ribs Unilateral Left    Standing Status:   Future    Number of Occurrences:   1    Standing Expiration Date:   12/10/2020    Order Specific Question:   Reason for Exam (SYMPTOM  OR DIAGNOSIS REQUIRED)    Answer:   rib fracture    Order Specific Question:   Preferred imaging location?    Answer:   [facility]    Requested Prescriptions    No prescriptions requested or ordered in this encounter

## 2019-12-11 NOTE — Telephone Encounter (Signed)
Rx has been sent to the pharmacy electronically. ° °

## 2019-12-22 ENCOUNTER — Ambulatory Visit: Payer: Medicare Other | Attending: Internal Medicine

## 2019-12-22 DIAGNOSIS — Z23 Encounter for immunization: Secondary | ICD-10-CM

## 2019-12-22 NOTE — Progress Notes (Signed)
   Covid-19 Vaccination Clinic  Name:  JOSCELYNE RENVILLE    MRN: [id number] DOB: Aug 18, 1935  12/22/2019  Ms. Runkles was observed post Covid-19 immunization for 15 minutes without incident. She was provided with Vaccine Information Sheet and instruction to access the V-Safe system.   Ms. Clayton was instructed to call 911 with any severe reactions post vaccine: [name] Difficulty breathing  . Swelling of face and throat  . A fast heartbeat  . A bad rash all over body  . Dizziness and weakness

## 2020-01-28 ENCOUNTER — Other Ambulatory Visit: Payer: Self-pay | Admitting: Family

## 2020-01-28 ENCOUNTER — Telehealth: Payer: Self-pay | Admitting: Family

## 2020-01-28 DIAGNOSIS — Z8781 Personal history of (healed) traumatic fracture: Secondary | ICD-10-CM

## 2020-01-28 NOTE — Telephone Encounter (Signed)
    Patient calling to request order for xray of ribs. Last xray was 12/11/19, she is calling for the 2 month follow up xray

## 2020-01-28 NOTE — Telephone Encounter (Signed)
Notified patient that she may got to [facility] for x-ray after DEC 16.

## 2020-02-13 ENCOUNTER — Other Ambulatory Visit: Payer: Self-pay | Admitting: Endocrinology

## 2020-02-13 NOTE — Telephone Encounter (Signed)
Transferring to your care, thanks

## 2020-02-18 ENCOUNTER — Telehealth: Payer: Self-pay | Admitting: Cardiovascular Disease

## 2020-02-18 NOTE — Telephone Encounter (Signed)
Encounter not needed

## 2020-02-19 ENCOUNTER — Telehealth: Payer: Self-pay | Admitting: Cardiovascular Disease

## 2020-02-19 NOTE — Telephone Encounter (Signed)
Pt states she would like Dr. Hazle Coca opinion on whether it is safe for her to fly to [place] in February. She sees Dr. Allyson Sabal for follow up on January 12, but she would like to know sooner if possible so that she can go ahead and book her flight. Advised pt that Dr. Allyson Sabal is currently on vacation, but that we would let her know as soon as possible. Pt verbalizes understanding and gratitude for the help.

## 2020-02-19 NOTE — Telephone Encounter (Signed)
Patient calling to find out if it is okay for her to fly. She states she was planning to go to [place], but wants to make sure it is okay before she books anything.

## 2020-02-19 NOTE — Telephone Encounter (Signed)
OK to fly to [place] if she's not been experiencing any angina

## 2020-02-19 NOTE — Telephone Encounter (Signed)
Called to relay Dr. Hazle Coca advice that flying to [facility] is okay as long as she is not experiencing any angina. Pt confirms that she is not experiencing angina. Follow up appointment confirmed with pt. She voices understanding and gratitude for our assistance.

## 2020-03-05 ENCOUNTER — Ambulatory Visit (INDEPENDENT_AMBULATORY_CARE_PROVIDER_SITE_OTHER): Payer: Medicare Other | Admitting: Cardiovascular Disease

## 2020-03-05 ENCOUNTER — Encounter: Payer: Self-pay | Admitting: Cardiovascular Disease

## 2020-03-05 ENCOUNTER — Ambulatory Visit: Payer: Medicare Other | Admitting: Cardiovascular Disease

## 2020-03-05 ENCOUNTER — Other Ambulatory Visit: Payer: Self-pay

## 2020-03-05 DIAGNOSIS — I10 Essential (primary) hypertension: Secondary | ICD-10-CM

## 2020-03-05 DIAGNOSIS — I25118 Atherosclerotic heart disease of native coronary artery with other forms of angina pectoris: Secondary | ICD-10-CM | POA: Diagnosis not present

## 2020-03-05 DIAGNOSIS — E782 Mixed hyperlipidemia: Secondary | ICD-10-CM

## 2020-03-05 MED ORDER — AMLODIPINE BESYLATE 2.5 MG PO TABS: 2.5000 mg | ORAL_TABLET | Freq: Every day | ORAL | 4 refills | Status: DC

## 2020-03-05 NOTE — Assessment & Plan Note (Signed)
History of CAD status post cardiac catheterization performed 11/19/2019 after coronary calcium score was measured at 1681 with a suggestion of proximal to mid RCA disease.  This revealed a 99% calcified ostial RCA stenosis with grade 2-3 left-to-right collaterals.  I did attempt to perform orbital atherectomy and I crossed the lesion with a FlexTip Viper wire but was unsure that I was in the true lumen and therefore aborted the procedure.  She does complain of some dyspnea on exertion when doing water aerobics.  I am going to begin her on amlodipine 2.5 mg a day and we will see her back in 3 months for follow-up.

## 2020-03-05 NOTE — Assessment & Plan Note (Signed)
History of hyperlipidemia on statin therapy with lipid profile performed 11/20/2019 revealing total cholesterol of 119, LDL 51 and HDL of 45.

## 2020-03-05 NOTE — Progress Notes (Signed)
03/05/2020 Barbara Maxwell   August 16, 1935  [id number]  Primary Physician [name], [place] Primary Cardiologist: Lorretta Harp MD Lupe Carney, [place]  HPI:  Barbara Maxwell is a 85 y.o.  mildly overweight widowed Caucasian female (husband of 84 years recently passed away), mother of 3 living children (daughter 49 committed suicide), grandmother 6 grandchildren referred by Dr. Fatima Blank cardiovascular valuation because of atypical chest pain and dyspnea.I last saw her in the office  11/27/2019. Risk factors are only notable for hyperlipidemia treated. Her father apparently did have bypass surgery and she had a son who had a stent. She is never had a heart attack or stroke. She is fairly active and does water aerobics 2 days/week. She is noticed some dyspnea walking up an incline recently and some atypical chest pain. She does admit to anxiety since she is living alone currently.  I obtained a 2D echocardiogram on 10/15/2019 which was essentially normal with grade 1 diastolic dysfunction. A coronary calcium score was measured at 1681 with suggestion of proximal to mid RCA disease and LAD disease.  Based on this I performed outpatient diagnostic coronary angiography on her 11/19/2019 with a right radial approach revealing a 60% proximal LAD lesion just after the first diagonal branch which I did not think appeared physiologically significant and a 99% calcified ostial RCA with grade 2-3 left-to-right collaterals.  I placed a temporary transvenous pacemaker via the right common femoral vein and did attempt orbital atherectomy PCI and stenting however I was able to cross with a wire but was unable to confirm that I was intraluminal and therefore aborted the procedure.  My plan was to treat her medically initially with intentional medications were history of intervention for recalcitrant symptoms  Since I saw her 3 months ago she does complain of some dyspnea on exertion when doing  water aerobics or walking at a brisk pace.  Her symptoms have been fairly stable over the last several months.  She is scheduled to go out to [place] in the near future for 10 days to visit her fifth great-grandchild.   Current Meds  Medication Sig  . acetaminophen (TYLENOL) 325 MG tablet Take 325 mg by mouth every 6 (six) hours as needed for moderate pain.   [name] aspirin EC 81 MG tablet Take 1 tablet (81 mg total) by mouth daily. Swallow whole.  . Calcium Carbonate-Vitamin D (CALCIUM + D PO) Take 1 tablet by mouth daily.  . Carboxymethylcellulose Sodium (REFRESH TEARS OP) Place 1 drop into both eyes daily.  . Cholecalciferol (VITAMIN D) 50 MCG (2000 UT) tablet Take 2,000 Units by mouth daily.  . clopidogrel (PLAVIX) 75 MG tablet Take 1 tablet (75 mg total) by mouth daily with breakfast.  . ezetimibe (ZETIA) 10 MG tablet TAKE 1 TABLET DAILY (Patient taking differently: Take 10 mg by mouth daily.)  . famotidine (PEPCID) 20 MG tablet Take 20 mg by mouth 2 (two) times daily.  . metoprolol succinate (TOPROL-XL) 25 MG 24 hr tablet Take 1 tablet (25 mg total) by mouth daily.  . nitroGLYCERIN (NITROSTAT) 0.4 MG SL tablet Place 1 tablet (0.4 mg total) under the tongue every 5 (five) minutes as needed for chest pain.  . rosuvastatin (CRESTOR) 40 MG tablet TAKE 1 TABLET DAILY (Patient taking differently: Take 40 mg by mouth daily.)  . sertraline (ZOLOFT) 50 MG tablet Take 1 tablet (50 mg total) by mouth daily.  . sodium chloride (OCEAN) 0.65 % SOLN nasal spray Place  1 spray into both nostrils daily.     Allergies  Allergen Reactions  . Bee Venom Anaphylaxis  . Simvastatin     Myopathy  . Codeine Nausea Only  . Evista [Raloxifene]     Hot flashes  . Percocet [Oxycodone-Acetaminophen] Nausea And Vomiting    Social History   Socioeconomic History  . Marital status: Widowed    Spouse name: Not on file  . Number of children: Not on file  . Years of education: Not on file  . Highest education  level: Not on file  Occupational History  . Not on file  Tobacco Use  . Smoking status: Never Smoker  . Smokeless tobacco: Never Used  Vaping Use  . Vaping Use: Never used  Substance and Sexual Activity  . Alcohol use: No    Alcohol/week: 0.0 standard drinks  . Drug use: Not on file  . Sexual activity: Not Currently    Comment: 1st intercourse- 20, partners- 2, widow  Other Topics Concern  . Not on file  Social History Narrative  . Not on file   Social Determinants of Health   Financial Resource Strain: Not on file  Food Insecurity: Not on file  Transportation Needs: Not on file  Physical Activity: Not on file  Stress: Not on file  Social Connections: Not on file  Intimate Partner Violence: Not on file     Review of Systems: General: negative for chills, fever, night sweats or weight changes.  Cardiovascular: negative for chest pain, dyspnea on exertion, edema, orthopnea, palpitations, paroxysmal nocturnal dyspnea or shortness of breath Dermatological: negative for rash Respiratory: negative for cough or wheezing Urologic: negative for hematuria Abdominal: negative for nausea, vomiting, diarrhea, bright red blood per rectum, melena, or hematemesis Neurologic: negative for visual changes, syncope, or dizziness All other systems reviewed and are otherwise negative except as noted above.    Blood pressure 118/70, pulse 72, height 5\' 3"  (1.6 m), weight 145 lb 9.6 oz (66 kg).  General appearance: alert and no distress Neck: no adenopathy, no carotid bruit, no JVD, supple, symmetrical, trachea midline and thyroid not enlarged, symmetric, no tenderness/mass/nodules Lungs: clear to auscultation bilaterally Heart: regular rate and rhythm, S1, S2 normal, no murmur, click, rub or gallop Extremities: extremities normal, atraumatic, no cyanosis or edema Pulses: 2+ and symmetric Skin: Skin color, texture, turgor normal. No rashes or lesions Neurologic: Alert and oriented X 3,  normal strength and tone. Normal symmetric reflexes. Normal coordination and gait  EKG not performed today  ASSESSMENT AND PLAN:   Essential hypertension History of essential hypertension a blood pressure measured today at 118/70.  She is on metoprolol.  Hyperlipidemia History of hyperlipidemia on statin therapy with lipid profile performed 11/20/2019 revealing total cholesterol of 119, LDL 51 and HDL of 45.  CAD (coronary artery disease) History of CAD status post cardiac catheterization performed 11/19/2019 after coronary calcium score was measured at 1681 with a suggestion of proximal to mid RCA disease.  This revealed a 99% calcified ostial RCA stenosis with grade 2-3 left-to-right collaterals.  I did attempt to perform orbital atherectomy and I crossed the lesion with a FlexTip Viper wire but was unsure that I was in the true lumen and therefore aborted the procedure.  She does complain of some dyspnea on exertion when doing water aerobics.  I am going to begin her on amlodipine 2.5 mg a day and we will see her back in 3 months for follow-up.      Pearletha Forge.  Gwenlyn Found MD [facility], [facility] 03/05/2020 11:33 AM

## 2020-03-05 NOTE — Patient Instructions (Signed)
Medication Instructions:  Start taking Amlodipine (norvasc) 2.5mg  once daily.    *If you need a refill on your cardiac medications before your next appointment, please call your pharmacy*   Follow-Up: At [facility], you and your health needs are our priority.  As part of our continuing mission to provide you with exceptional heart care, we have created designated Provider Care Teams.  These Care Teams include your primary Cardiologist (physician) and Advanced Practice Providers (APPs -  Physician Assistants and Nurse Practitioners) who all work together to provide you with the care you need, when you need it.  We recommend signing up for the patient portal called "MyChart".  Sign up information is provided on this After Visit Summary.  MyChart is used to connect with patients for Virtual Visits (Telemedicine).  Patients are able to view lab/test results, encounter notes, upcoming appointments, etc.  Non-urgent messages can be sent to your provider as well.   To learn more about what you can do with MyChart, go to [email or web address].    Your next appointment:   3 month(s)  The format for your next appointment:   In Person  Provider:   Quay Burow, MD

## 2020-03-05 NOTE — Assessment & Plan Note (Signed)
History of essential hypertension a blood pressure measured today at 118/70.  She is on metoprolol.

## 2020-03-13 ENCOUNTER — Other Ambulatory Visit: Payer: Self-pay

## 2020-03-24 ENCOUNTER — Telehealth: Payer: Self-pay | Admitting: Cardiovascular Disease

## 2020-03-24 NOTE — Telephone Encounter (Signed)
*  STAT* If patient is at the pharmacy, call can be transferred to refill team.   1. Which medications need to be refilled? (please list name of each medication and dose if known)  amLODipine (NORVASC) 2.5 MG tablet   2. Which pharmacy/location (including street and city if local pharmacy) is medication to be sent to?  [facility] [place], [place]  3. Do they need a 30 day or 90 day supply? [place]

## 2020-03-24 NOTE — Telephone Encounter (Signed)
error 

## 2020-04-07 ENCOUNTER — Telehealth: Payer: Self-pay | Admitting: Cardiovascular Disease

## 2020-04-07 NOTE — Telephone Encounter (Signed)
°*  STAT* If patient is at the pharmacy, call can be transferred to refill team.   1. Which medications need to be refilled? (please list name of each medication and dose if known)   amLODipine (NORVASC) 2.5 MG tablet   2. Which pharmacy/location (including street and city if local pharmacy) is medication to be sent to? [facility] [place], [place]  3. Do they need a 30 day or 90 day supply? 90 day supply   Prescription was never received. [phone] [facility] requested a call on this number from the nurse in regards to prescription.

## 2020-04-08 ENCOUNTER — Encounter: Payer: Self-pay | Admitting: Family

## 2020-04-08 ENCOUNTER — Other Ambulatory Visit: Payer: Self-pay | Admitting: Endocrinology

## 2020-04-08 NOTE — Telephone Encounter (Signed)
error 

## 2020-04-08 NOTE — Telephone Encounter (Signed)
*  STAT* If patient is at the pharmacy, call can be transferred to refill team.   1. Which medications need to be refilled? (please list name of each medication and dose if known)  New prescription for local pharmacy- until mail order comes for Amlodipine  2. Which pharmacy/location (including street and city if local pharmacy) is medication to be sent to? [facility], [place]  3. Do they need a 30 day or 90 day supply?30 days     Daughter also would like for somebody to check on the prescription for this that was sent to [facility] please

## 2020-04-09 MED ORDER — AMLODIPINE BESYLATE 2.5 MG PO TABS: 2.5000 mg | ORAL_TABLET | Freq: Every day | ORAL | 3 refills | Status: DC

## 2020-04-10 ENCOUNTER — Other Ambulatory Visit: Payer: Medicare Other

## 2020-04-10 DIAGNOSIS — Z20822 Contact with and (suspected) exposure to covid-19: Secondary | ICD-10-CM | POA: Diagnosis not present

## 2020-04-11 LAB — NOVEL CORONAVIRUS, NAA: SARS-CoV-2, NAA: NOT DETECTED

## 2020-04-11 LAB — SARS-COV-2, NAA 2 DAY TAT

## 2020-04-14 ENCOUNTER — Other Ambulatory Visit: Payer: Self-pay

## 2020-04-14 ENCOUNTER — Ambulatory Visit
Admission: RE | Admit: 2020-04-14 | Discharge: 2020-04-14 | Disposition: A | Discharge status: Home / Self Care | Payer: Medicare Other | Source: Ambulatory Visit | Attending: Family | Admitting: Family

## 2020-04-14 ENCOUNTER — Encounter: Payer: Self-pay | Admitting: Family

## 2020-04-14 ENCOUNTER — Ambulatory Visit (INDEPENDENT_AMBULATORY_CARE_PROVIDER_SITE_OTHER): Payer: Medicare Other | Admitting: Family

## 2020-04-14 VITALS — BP 118/80 | HR 82 | Temp 98.7°F | Resp 18 | Ht 63.0 in | Wt 146.2 lb

## 2020-04-14 DIAGNOSIS — G8929 Other chronic pain: Secondary | ICD-10-CM

## 2020-04-14 DIAGNOSIS — R519 Headache, unspecified: Secondary | ICD-10-CM | POA: Diagnosis not present

## 2020-04-14 DIAGNOSIS — M545 Low back pain, unspecified: Secondary | ICD-10-CM

## 2020-04-14 DIAGNOSIS — H6092 Unspecified otitis externa, left ear: Secondary | ICD-10-CM | POA: Diagnosis not present

## 2020-04-14 MED ORDER — TRAMADOL HCL 50 MG PO TABS: 50.0000 mg | ORAL_TABLET | ORAL | 0 refills | Status: AC | PRN

## 2020-04-14 MED ORDER — NEOMYCIN-POLYMYXIN-HC 3.5-10000-1 OT SOLN: 3.0000 [drp] | Freq: Three times a day (TID) | OTIC | 0 refills | Status: DC

## 2020-04-14 NOTE — Progress Notes (Signed)
Barbara Maxwell is a 85 y.o. female with the following history as recorded in EpicCare:  Patient Active Problem List   Diagnosis Date Noted  . CAD (coronary artery disease) 11/19/2019  . Atypical chest pain 09/28/2019  . Dyspnea on exertion 09/28/2019  . H/O vitamin D deficiency 01/21/2015  . Osteopenia 01/21/2015  . Vaginal atrophy 01/21/2015  . Mixed hyperlipidemia 02/04/2014  . Family history of ovarian cancer 11/13/2012  . Rectocele 11/07/2012  . Hyperlipidemia 09/29/2012  . Essential hypertension 08/07/2009  . ALLERGIC RHINITIS 08/07/2009  . HYPERSOMNIA WITH SLEEP APNEA UNSPECIFIED 08/04/2009    Current Outpatient Medications  Medication Sig Dispense Refill  . acetaminophen (TYLENOL) 325 MG tablet Take 325 mg by mouth every 6 (six) hours as needed for moderate pain.     [name] amLODipine (NORVASC) 2.5 MG tablet Take 1 tablet (2.5 mg total) by mouth daily. 90 tablet 3  . aspirin EC 81 MG tablet Take 1 tablet (81 mg total) by mouth daily. Swallow whole. 90 tablet 3  . Calcium Carbonate-Vitamin D (CALCIUM + D PO) Take 1 tablet by mouth daily.    . Carboxymethylcellulose Sodium (REFRESH TEARS OP) Place 1 drop into both eyes daily.    . Cholecalciferol (VITAMIN D) 50 MCG (2000 UT) tablet Take 2,000 Units by mouth daily.    . clopidogrel (PLAVIX) 75 MG tablet Take 1 tablet (75 mg total) by mouth daily with breakfast. 90 tablet 2  . ezetimibe (ZETIA) 10 MG tablet TAKE 1 TABLET DAILY 90 tablet 3  . famotidine (PEPCID) 20 MG tablet Take 20 mg by mouth 2 (two) times daily.    . metoprolol succinate (TOPROL-XL) 25 MG 24 hr tablet Take 1 tablet (25 mg total) by mouth daily. 90 tablet 2  . neomycin-polymyxin-hydrocortisone (CORTISPORIN) OTIC solution Place 3 drops into the left ear 3 (three) times daily. 10 mL 0  . nitroGLYCERIN (NITROSTAT) 0.4 MG SL tablet Place 1 tablet (0.4 mg total) under the tongue every 5 (five) minutes as needed for chest pain. 25 tablet 3  . rosuvastatin (CRESTOR) 40 MG  tablet TAKE 1 TABLET DAILY 90 tablet 3  . sertraline (ZOLOFT) 50 MG tablet Take 1 tablet (50 mg total) by mouth daily. 90 tablet 3  . sodium chloride (OCEAN) 0.65 % SOLN nasal spray Place 1 spray into both nostrils daily.    . traMADol (ULTRAM) 50 MG tablet Take 1 tablet (50 mg total) by mouth as needed. 30 tablet 0   No current facility-administered medications for this visit.    Allergies: Bee venom, Simvastatin, Codeine, Evista [raloxifene], and Percocet [oxycodone-acetaminophen]  Past Medical History:  Diagnosis Date  . Cancer ([place])    [facility]-  MELANOMA REMOVED FROM BACK   . Coronary artery disease   . Depression   . Hypercholesterolemia   . Hypertension     Past Surgical History:  Procedure Laterality Date  . ABDOMINAL HYSTERECTOMY     VAGINAL HYSTERECTOMY WITH OVARIAN PRESERVATION   . ABDOMINOPLASTY  1998  . APPENDECTOMY    . BREAST SURGERY  1985   BREAST REDUCTION   . CARDIAC CATHETERIZATION  11/19/2019  . CORONARY BALLOON ANGIOPLASTY N/A 11/19/2019   Procedure: CORONARY BALLOON ANGIOPLASTY;  Surgeon: Lorretta Harp, MD;  Location: [place] [facility];  Service: Cardiovascular;  Laterality: N/A;  . LEFT HEART CATH AND CORONARY ANGIOGRAPHY N/A 11/19/2019   Procedure: LEFT HEART CATH AND CORONARY ANGIOGRAPHY;  Surgeon: Lorretta Harp, MD;  Location: [place] [facility];  Service:  Cardiovascular;  Laterality: N/A;  . TONSILLECTOMY AND ADENOIDECTOMY      Family History  Problem Relation Age of Onset  . Hypertension Mother   . Heart disease Father   . Diabetes Father   . Cancer Sister 27       OVARIAN   . Thyroid disease Sister   . Heart disease Brother   . Thyroid disease Daughter   . Diabetes Paternal Grandmother     Social History   Tobacco Use  . Smoking status: Never Smoker  . Smokeless tobacco: Never Used  Substance Use Topics  . Alcohol use: No    Alcohol/week: 0.0 standard drinks    Subjective:  Patient had "bad headache" on the right side of her  head for about 24 hours last week- notes that the area hurt so badly that "even her hair hurt." Symptoms had resolved within 24 hours; is traveling to [place] in 2 days and wanted to be sure it was safe to travel; of note, had shingles presentation in the same area almost 15-20 years ago; did not notice any blisters this particular time however; no vision or speech changes;  Requesting Rx for Tramadol which she keeps on hand for recurrent back issues- concerned with long flight upcoming to [place];     Objective:  Vitals:   04/14/20 0959  BP: 118/80  Pulse: 82  Resp: 18  Temp: 98.7 F (37.1 C)  TempSrc: Oral  SpO2: 98%  Weight: 146 lb 3.2 oz (66.3 kg)  Height: 5\' 3"  (1.6 m)    General: Well developed, well nourished, in no acute distress  Skin : Warm and dry.  Head: Normocephalic and atraumatic  Eyes: Sclera and conjunctiva clear; pupils round and reactive to light; extraocular movements intact  Ears: left External dried blood noted; limited visualization of left TM Oropharynx: Pink, supple. No suspicious lesions  Neck: Supple without thyromegaly, adenopathy  Lungs: Respirations unlabored; clear to auscultation bilaterally without wheeze, rales, rhonchi  CVS exam: normal rate and regular rhythm.  Neurologic: Alert and oriented; speech intact; face symmetrical; moves all extremities well; CNII-XII intact without focal deficit   Assessment:  1. Nonintractable headache, unspecified chronicity pattern, unspecified headache type   2. Chronic low back pain without sciatica, unspecified back pain laterality   3. Otitis externa of left ear, unspecified chronicity, unspecified type     Plan:  1. ? Atypical shingles presentation; physical exam today is reassuring; check head CT today; 2. Refill on Tramadol as requested; 3. Rx for Cortisporin Otic suspension- use as directed;  This visit occurred during the SARS-CoV-2 public health emergency.  Safety protocols were in place, including  screening questions prior to the visit, additional usage of staff PPE, and extensive cleaning of exam room while observing appropriate contact time as indicated for disinfecting solutions.     No follow-ups on file.  Orders Placed This Encounter  Procedures  . CT Head Wo Contrast    Standing Status:   Future    Number of Occurrences:   1    Standing Expiration Date:   04/14/2021    Order Specific Question:   Preferred imaging location?    Answer:   [id number]    Requested Prescriptions   Signed Prescriptions Disp Refills  . neomycin-polymyxin-hydrocortisone (CORTISPORIN) OTIC solution 10 mL 0    Sig: Place 3 drops into the left ear 3 (three) times daily.  . traMADol (ULTRAM) 50 MG tablet 30 tablet 0    Sig: Take 1  tablet (50 mg total) by mouth as needed.

## 2020-04-29 ENCOUNTER — Ambulatory Visit: Payer: Medicare Other | Admitting: Cardiovascular Disease

## 2020-05-25 ENCOUNTER — Emergency Department (HOSPITAL_COMMUNITY)
Admission: EM | Admit: 2020-05-25 | Discharge: 2020-05-25 | Disposition: A | Discharge status: Home / Self Care | Payer: Medicare Other | Attending: Emergency Medicine | Admitting: Emergency Medicine

## 2020-05-25 ENCOUNTER — Emergency Department (HOSPITAL_COMMUNITY): Payer: Medicare Other

## 2020-05-25 ENCOUNTER — Encounter (HOSPITAL_COMMUNITY): Payer: Self-pay

## 2020-05-25 DIAGNOSIS — D509 Iron deficiency anemia, unspecified: Secondary | ICD-10-CM | POA: Diagnosis not present

## 2020-05-25 DIAGNOSIS — I959 Hypotension, unspecified: Secondary | ICD-10-CM | POA: Diagnosis not present

## 2020-05-25 DIAGNOSIS — R079 Chest pain, unspecified: Secondary | ICD-10-CM | POA: Diagnosis not present

## 2020-05-25 DIAGNOSIS — Z85828 Personal history of other malignant neoplasm of skin: Secondary | ICD-10-CM | POA: Insufficient documentation

## 2020-05-25 DIAGNOSIS — I25118 Atherosclerotic heart disease of native coronary artery with other forms of angina pectoris: Secondary | ICD-10-CM | POA: Diagnosis not present

## 2020-05-25 DIAGNOSIS — M79603 Pain in arm, unspecified: Secondary | ICD-10-CM | POA: Diagnosis not present

## 2020-05-25 DIAGNOSIS — Z7902 Long term (current) use of antithrombotics/antiplatelets: Secondary | ICD-10-CM | POA: Insufficient documentation

## 2020-05-25 DIAGNOSIS — D649 Anemia, unspecified: Secondary | ICD-10-CM | POA: Diagnosis not present

## 2020-05-25 DIAGNOSIS — I209 Angina pectoris, unspecified: Secondary | ICD-10-CM | POA: Diagnosis not present

## 2020-05-25 DIAGNOSIS — Z7982 Long term (current) use of aspirin: Secondary | ICD-10-CM | POA: Diagnosis not present

## 2020-05-25 DIAGNOSIS — Z79899 Other long term (current) drug therapy: Secondary | ICD-10-CM | POA: Insufficient documentation

## 2020-05-25 DIAGNOSIS — I10 Essential (primary) hypertension: Secondary | ICD-10-CM | POA: Insufficient documentation

## 2020-05-25 DIAGNOSIS — R001 Bradycardia, unspecified: Secondary | ICD-10-CM | POA: Diagnosis not present

## 2020-05-25 DIAGNOSIS — R110 Nausea: Secondary | ICD-10-CM | POA: Diagnosis not present

## 2020-05-25 DIAGNOSIS — R61 Generalized hyperhidrosis: Secondary | ICD-10-CM | POA: Diagnosis not present

## 2020-05-25 DIAGNOSIS — R42 Dizziness and giddiness: Secondary | ICD-10-CM | POA: Diagnosis not present

## 2020-05-25 LAB — BASIC METABOLIC PANEL
Anion gap: 5 (ref 5–15)
BUN: 12 mg/dL (ref 8–23)
CO2: 26 mmol/L (ref 22–32)
Calcium: 9.1 mg/dL (ref 8.9–10.3)
Chloride: 109 mmol/L (ref 98–111)
Creatinine, Ser: 1.13 mg/dL — ABNORMAL HIGH (ref 0.44–1.00)
GFR, Estimated: 49 mL/min — ABNORMAL LOW (ref >60)
Glucose, Bld: 107 mg/dL — ABNORMAL HIGH (ref 70–99)
Potassium: 4.2 mmol/L (ref 3.5–5.1)
Sodium: 140 mmol/L (ref 135–145)

## 2020-05-25 LAB — CBC
HCT: 26.9 % — ABNORMAL LOW (ref 36.0–46.0)
Hemoglobin: 7.7 g/dL — ABNORMAL LOW (ref 12.0–15.0)
MCH: 21 pg — ABNORMAL LOW (ref 26.0–34.0)
MCHC: 28.6 g/dL — ABNORMAL LOW (ref 30.0–36.0)
MCV: 73.5 fL — ABNORMAL LOW (ref 80.0–100.0)
Platelets: 257 10*3/uL (ref 150–400)
RBC: 3.66 MIL/uL — ABNORMAL LOW (ref 3.87–5.11)
RDW: 16.7 % — ABNORMAL HIGH (ref 11.5–15.5)
WBC: 6.4 10*3/uL (ref 4.0–10.5)
nRBC: 0 % (ref 0.0–0.2)

## 2020-05-25 LAB — TROPONIN I (HIGH SENSITIVITY)
Troponin I (High Sensitivity): 6 ng/L (ref <18)
Troponin I (High Sensitivity): 6 ng/L (ref <18)

## 2020-05-25 LAB — PREPARE RBC (CROSSMATCH)

## 2020-05-25 LAB — RETICULOCYTES
Immature Retic Fract: 12.2 % (ref 2.3–15.9)
RBC.: 3.7 MIL/uL — ABNORMAL LOW (ref 3.87–5.11)
Retic Count, Absolute: 30.7 10*3/uL (ref 19.0–186.0)
Retic Ct Pct: 0.8 % (ref 0.4–3.1)

## 2020-05-25 LAB — ABO/RH: ABO/RH(D): A NEG

## 2020-05-25 LAB — POC OCCULT BLOOD, ED: Fecal Occult Bld: NEGATIVE

## 2020-05-25 MED ORDER — SODIUM CHLORIDE 0.9 % IV BOLUS
500.0000 mL | Freq: Once | INTRAVENOUS | Status: AC
  Administered 2020-05-25: 500 mL via INTRAVENOUS

## 2020-05-25 MED ORDER — SODIUM CHLORIDE 0.9 % IV SOLN
10.0000 mL/h | Freq: Once | INTRAVENOUS | Status: AC
  Administered 2020-05-25: 10 mL/h via INTRAVENOUS

## 2020-05-25 NOTE — ED Triage Notes (Addendum)
[place] [facility]. Hx o CAD with 90% blockage that cannot be stinted. Pt started having arm pain at church, took 1 sublingual nitro, became nauseated, dizzy, and diaphoretic. Pt denies CP, 12 lead unremarkable. EMS reports initial BP 96/40, No IV or meds. Pt A/O x4 BPcurrently 108/70.  HR 50's O2 96% RA

## 2020-05-25 NOTE — Discharge Instructions (Signed)
Please start taking ferrous sulfate 325 mg 3 times daily.  Absorption of iron is improved with consumption of citrus-containing food.  Coffee and tea can can for absorption of iron.  If you develop nausea or constipation, please move to taking the iron every other day.

## 2020-05-25 NOTE — ED Provider Notes (Signed)
[facility] EMERGENCY DEPARTMENT Provider Note   CSN: [id number] Arrival date & time: 05/25/20  1255     History Chief Complaint  Patient presents with  . Dizziness    Barbara Maxwell is a 85 y.o. female.  Barbara Maxwell presents after she developed an episode of chest pain at church.  The chest pain started approximately 2 hours ago at 12:15 PM.  She has a known history of coronary artery disease and recently had a cardiac cath that revealed a significant right coronary artery stenosis.  Intervention was not successful, and she was prescribed medical management.  When the patient developed chest pain, she took nitroglycerin.  However, she immediately became very nauseated, dizzy, and diaphoretic.  She was taken to the hospital via EMS.  Currently she is feeling better.  The history is provided by the patient.  Chest Pain Pain location:  L chest Pain quality: pressure   Pain radiates to:  L jaw and L arm Pain severity:  Moderate Onset quality:  Sudden Duration:  15 minutes Timing:  Constant Progression:  Resolved Chronicity:  Recurrent Context: at rest   Relieved by:  Nitroglycerin Worsened by:  Nothing Associated symptoms: diaphoresis and dizziness   Associated symptoms: no abdominal pain, no back pain, no cough, no fever, no palpitations, no shortness of breath and no vomiting   Risk factors: coronary artery disease        Past Medical History:  Diagnosis Date  . Cancer ([place])    [facility]-  MELANOMA REMOVED FROM BACK   . Coronary artery disease   . Depression   . Hypercholesterolemia   . Hypertension     Patient Active Problem List   Diagnosis Date Noted  . CAD (coronary artery disease) 11/19/2019  . Atypical chest pain 09/28/2019  . Dyspnea on exertion 09/28/2019  . H/O vitamin D deficiency 01/21/2015  . Osteopenia 01/21/2015  . Vaginal atrophy 01/21/2015  . Mixed hyperlipidemia 02/04/2014  . Family history of ovarian cancer 11/13/2012  .  Rectocele 11/07/2012  . Hyperlipidemia 09/29/2012  . Essential hypertension 08/07/2009  . ALLERGIC RHINITIS 08/07/2009  . HYPERSOMNIA WITH SLEEP APNEA UNSPECIFIED 08/04/2009    Past Surgical History:  Procedure Laterality Date  . ABDOMINAL HYSTERECTOMY     VAGINAL HYSTERECTOMY WITH OVARIAN PRESERVATION   . ABDOMINOPLASTY  1998  . APPENDECTOMY    . BREAST SURGERY  1985   BREAST REDUCTION   . CARDIAC CATHETERIZATION  11/19/2019  . CORONARY BALLOON ANGIOPLASTY N/A 11/19/2019   Procedure: CORONARY BALLOON ANGIOPLASTY;  Surgeon: Lorretta Harp, MD;  Location: [facility];  Service: Cardiovascular;  Laterality: N/A;  . LEFT HEART CATH AND CORONARY ANGIOGRAPHY N/A 11/19/2019   Procedure: LEFT HEART CATH AND CORONARY ANGIOGRAPHY;  Surgeon: Lorretta Harp, MD;  Location: [facility];  Service: Cardiovascular;  Laterality: N/A;  . TONSILLECTOMY AND ADENOIDECTOMY       OB History    Gravida  4   Para  4   Term      Preterm      AB      Living  4     SAB      IAB      Ectopic      Multiple      Live Births              Family History  Problem Relation Age of Onset  . Hypertension Mother   . Heart disease Father   .  Diabetes Father   . Cancer Sister 17       OVARIAN   . Thyroid disease Sister   . Heart disease Brother   . Thyroid disease Daughter   . Diabetes Paternal Grandmother     Social History   Tobacco Use  . Smoking status: Never Smoker  . Smokeless tobacco: Never Used  Vaping Use  . Vaping Use: Never used  Substance Use Topics  . Alcohol use: No    Alcohol/week: 0.0 standard drinks    Home Medications Prior to Admission medications   Medication Sig Start Date End Date Taking? Authorizing Provider  acetaminophen (TYLENOL) 325 MG tablet Take 325 mg by mouth every 6 (six) hours as needed for moderate pain.     [provider]  amLODipine (NORVASC) 2.5 MG tablet Take 1 tablet (2.5 mg total) by mouth daily. 04/09/20  07/08/20  Lorretta Harp, MD  aspirin EC 81 MG tablet Take 1 tablet (81 mg total) by mouth daily. Swallow whole. 11/20/19 11/19/20  Cheryln Manly, NP  Calcium Carbonate-Vitamin D (CALCIUM + D PO) Take 1 tablet by mouth daily.    [provider]  Carboxymethylcellulose Sodium (REFRESH TEARS OP) Place 1 drop into both eyes daily.    [provider]  Cholecalciferol (VITAMIN D) 50 MCG (2000 UT) tablet Take 2,000 Units by mouth daily.    [provider]  clopidogrel (PLAVIX) 75 MG tablet Take 1 tablet (75 mg total) by mouth daily with breakfast. 12/11/19   Lorretta Harp, MD  ezetimibe (ZETIA) 10 MG tablet TAKE 1 TABLET DAILY 04/08/20   Elayne Snare, MD  famotidine (PEPCID) 20 MG tablet Take 20 mg by mouth 2 (two) times daily.    [provider]  metoprolol succinate (TOPROL-XL) 25 MG 24 hr tablet Take 1 tablet (25 mg total) by mouth daily. 12/11/19   Lorretta Harp, MD  neomycin-polymyxin-hydrocortisone (CORTISPORIN) OTIC solution Place 3 drops into the left ear 3 (three) times daily. 04/14/20   [name], FNP  nitroGLYCERIN (NITROSTAT) 0.4 MG SL tablet Place 1 tablet (0.4 mg total) under the tongue every 5 (five) minutes as needed for chest pain. 11/20/19 11/19/20  Reino Bellis B, NP  rosuvastatin (CRESTOR) 40 MG tablet TAKE 1 TABLET DAILY 04/08/20   Elayne Snare, MD  sertraline (ZOLOFT) 50 MG tablet Take 1 tablet (50 mg total) by mouth daily. 02/13/20   [name], FNP  sodium chloride (OCEAN) 0.65 % SOLN nasal spray Place 1 spray into both nostrils daily.    [provider]  traMADol (ULTRAM) 50 MG tablet Take 1 tablet (50 mg total) by mouth as needed. 04/14/20 07/13/20  [name], FNP    Allergies    Bee venom, Simvastatin, Codeine, Evista [raloxifene], and Percocet [oxycodone-acetaminophen]  Review of Systems   Review of Systems  Constitutional: Positive for diaphoresis. Negative for chills and fever.   HENT: Negative for ear pain and sore throat.   Eyes: Negative for pain and visual disturbance.  Respiratory: Negative for cough and shortness of breath.   Cardiovascular: Positive for chest pain. Negative for palpitations.  Gastrointestinal: Negative for abdominal pain and vomiting.  Genitourinary: Negative for dysuria and hematuria.  Musculoskeletal: Negative for arthralgias and back pain.  Skin: Negative for color change and rash.  Neurological: Positive for dizziness and light-headedness. Negative for seizures and syncope.  All other systems reviewed and are negative.   Physical Exam Updated Vital Signs BP (!) 102/46  Pulse (!) 37   Temp 97.6 F (36.4 C) (Oral)   Resp 17   SpO2 99%   Physical Exam Vitals and nursing note reviewed.  Constitutional:      General: She is not in acute distress.    Appearance: She is well-developed.  HENT:     Head: Normocephalic and atraumatic.  Eyes:     Conjunctiva/sclera: Conjunctivae normal.  Cardiovascular:     Rate and Rhythm: Regular rhythm. Bradycardia present.     Heart sounds: No murmur heard.   Pulmonary:     Effort: Pulmonary effort is normal. No respiratory distress.     Breath sounds: Normal breath sounds.  Abdominal:     Palpations: Abdomen is soft.     Tenderness: There is no abdominal tenderness.  Genitourinary:    Rectum: Normal.     Comments: Brown stool Musculoskeletal:     Cervical back: Neck supple.  Skin:    General: Skin is warm and dry.  Neurological:     Mental Status: She is alert.     ED Results / Procedures / Treatments   Labs (all labs ordered are listed, but only abnormal results are displayed) Labs Reviewed  BASIC METABOLIC PANEL - Abnormal; Notable for the following components:      Result Value   Glucose, Bld 107 (*)    Creatinine, Ser 1.13 (*)    GFR, Estimated 49 (*)    All other components within normal limits  CBC - Abnormal; Notable for the following components:   RBC 3.66 (*)     Hemoglobin 7.7 (*)    HCT 26.9 (*)    MCV 73.5 (*)    MCH 21.0 (*)    MCHC 28.6 (*)    RDW 16.7 (*)    All other components within normal limits  RETICULOCYTES - Abnormal; Notable for the following components:   RBC. 3.70 (*)    All other components within normal limits  POC OCCULT BLOOD, ED  PREPARE RBC (CROSSMATCH)  TYPE AND SCREEN  ABO/RH  TROPONIN I (HIGH SENSITIVITY)  TROPONIN I (HIGH SENSITIVITY)    EKG EKG Interpretation  Date/Time:  Sunday May 25 2020 13:01:18 EDT Ventricular Rate:  53 PR Interval:  153 QRS Duration: 123 QT Interval:  484 QTC Calculation: 455 R Axis:   43 Text Interpretation: Sinus rhythm Nonspecific intraventricular conduction delay axis normal no acute ischemia Confirmed by Lorre Munroe ([id number]) on 05/25/2020 1:59:52 PM   Radiology DG Chest Port 1 View  Result Date: 05/25/2020 CLINICAL DATA:  Chest pain and left arm pain with nausea and dizziness as well as diaphoresis. EXAM: PORTABLE CHEST 1 VIEW COMPARISON:  12/11/2019 and 11/13/2018 FINDINGS: Lungs are adequately inflated and otherwise clear. Cardiomediastinal silhouette and remainder of the exam is unchanged. IMPRESSION: No active disease. Electronically Signed   By: Marin Olp M.D.   On: 05/25/2020 15:24    Procedures Procedures   Medications Ordered in ED Medications  sodium chloride 0.9 % bolus 500 mL (has no administration in time range)    ED Course  I have reviewed the triage vital signs and the nursing notes.  Pertinent labs & imaging results that were available during my care of the patient were reviewed by me and considered in my medical decision making (see chart for details).  Clinical Course as of 05/25/20 2040  Sun May 25, 2020  1808 Troponin I (High Sensitivity) [AW]  [phone] I spoke with cardiology.  They did agree with transfusing packed red  blood cells.  The patient should be careful with nitroglycerin and may not tolerate it.  She might be a candidate for Ranexa, but  this needs to be started with close cardiac follow-up secondary to needing serial EKGs to monitor for side effects. [AW]    Clinical Course User Index [AW] Arnaldo Natal, MD   MDM Rules/Calculators/A&P                          Barbara Maxwell presented with chest pain followed by a presyncopal event that was likely induced by nitroglycerin.  She has known, medically managed coronary artery disease and a significant RCA lesion.  She was given some IV fluids for her hypotension, and she was evaluated for acute coronary syndrome.  Her troponins were stable at 6, and her EKG was unremarkable.  She was found to be quite anemic, and her hemoglobin of 7.7 was down from 9.6 6 months ago.  She was given 1 unit of packed red blood cells.  It is possible her anemia could have been triggering her angina.  She does have cardiology follow-up this week, and she plans to keep this appointment.  She was advised to avoid nitroglycerin until she follows up.  As discussed above, she may be a candidate for Ranexa.  She was also advised to start taking iron supplementation, and she was told she would need to follow-up with her primary care doctor in regards to this anemia.  I spent quite some time talking to multiple family members about the patient, and she was given careful instructions on management of her condition, follow-up instructions, and return precautions. Final Clinical Impression(s) / ED Diagnoses Final diagnoses:  Iron deficiency anemia, unspecified iron deficiency anemia type  Angina pectoris (HCC)  Coronary artery disease of native artery of native heart with stable angina pectoris [facility])    Rx / DC Orders ED Discharge Orders    None       Arnaldo Natal, MD 05/25/20 2043

## 2020-05-25 NOTE — ED Notes (Signed)
Helene Kelp (daughter) [phone]

## 2020-05-26 LAB — TYPE AND SCREEN
ABO/RH(D): A NEG
Antibody Screen: NEGATIVE
Unit division: 0

## 2020-05-26 LAB — BPAM RBC
Blood Product Expiration Date: 202205012359
ISSUE DATE / TIME: 202204031848
Unit Type and Rh: 600

## 2020-05-27 ENCOUNTER — Ambulatory Visit (INDEPENDENT_AMBULATORY_CARE_PROVIDER_SITE_OTHER): Payer: Medicare Other | Admitting: Family

## 2020-05-27 ENCOUNTER — Other Ambulatory Visit: Payer: Self-pay

## 2020-05-27 ENCOUNTER — Encounter: Payer: Self-pay | Admitting: Gastroenterology

## 2020-05-27 ENCOUNTER — Encounter: Payer: Self-pay | Admitting: Family

## 2020-05-27 ENCOUNTER — Telehealth: Payer: Self-pay

## 2020-05-27 VITALS — BP 110/60 | HR 75 | Temp 99.5°F | Ht 63.0 in | Wt 141.6 lb

## 2020-05-27 DIAGNOSIS — Z9289 Personal history of other medical treatment: Secondary | ICD-10-CM | POA: Diagnosis not present

## 2020-05-27 DIAGNOSIS — D509 Iron deficiency anemia, unspecified: Secondary | ICD-10-CM

## 2020-05-27 LAB — COMPREHENSIVE METABOLIC PANEL
ALT: 11 U/L (ref 0–35)
AST: 19 U/L (ref 0–37)
Albumin: 4.3 g/dL (ref 3.5–5.2)
Alkaline Phosphatase: 48 U/L (ref 39–117)
BUN: 16 mg/dL (ref 6–23)
CO2: 28 mEq/L (ref 19–32)
Calcium: 9.7 mg/dL (ref 8.4–10.5)
Chloride: 108 mEq/L (ref 96–112)
Creatinine, Ser: 1.02 mg/dL (ref 0.40–1.20)
GFR: 51.08 mL/min — ABNORMAL LOW (ref >60.00)
Glucose, Bld: 98 mg/dL (ref 70–99)
Potassium: 5 mEq/L (ref 3.5–5.1)
Sodium: 142 mEq/L (ref 135–145)
Total Bilirubin: 0.7 mg/dL (ref 0.2–1.2)
Total Protein: 6.7 g/dL (ref 6.0–8.3)

## 2020-05-27 LAB — CBC WITH DIFFERENTIAL/PLATELET
Basophils Absolute: 0 10*3/uL (ref 0.0–0.1)
Basophils Relative: 0.6 % (ref 0.0–3.0)
Eosinophils Absolute: 0.1 10*3/uL (ref 0.0–0.7)
Eosinophils Relative: 1.4 % (ref 0.0–5.0)
HCT: 28.7 % — ABNORMAL LOW (ref 36.0–46.0)
Hemoglobin: 9.1 g/dL — ABNORMAL LOW (ref 12.0–15.0)
Lymphocytes Relative: 31.4 % (ref 12.0–46.0)
Lymphs Abs: 1.8 10*3/uL (ref 0.7–4.0)
MCHC: 31.7 g/dL (ref 30.0–36.0)
MCV: 69.6 fl — ABNORMAL LOW (ref 78.0–100.0)
Monocytes Absolute: 0.5 10*3/uL (ref 0.1–1.0)
Monocytes Relative: 8.6 % (ref 3.0–12.0)
Neutro Abs: 3.3 10*3/uL (ref 1.4–7.7)
Neutrophils Relative %: 58 % (ref 43.0–77.0)
Platelets: 274 10*3/uL (ref 150.0–400.0)
RBC: 4.12 Mil/uL (ref 3.87–5.11)
RDW: 19 % — ABNORMAL HIGH (ref 11.5–15.5)
WBC: 5.7 10*3/uL (ref 4.0–10.5)

## 2020-05-27 LAB — FERRITIN: Ferritin: 8.6 ng/mL — ABNORMAL LOW (ref 10.0–291.0)

## 2020-05-27 MED ORDER — PANTOPRAZOLE SODIUM 40 MG PO TBEC: 40.0000 mg | DELAYED_RELEASE_TABLET | Freq: Two times a day (BID) | ORAL | 1 refills | Status: DC

## 2020-05-27 NOTE — Progress Notes (Signed)
Barbara Maxwell is a 85 y.o. female with the following history as recorded in Epic[employer]:  Patient Active Problem List   Diagnosis Date Noted  . CAD (coronary artery disease) 11/19/2019  . Atypical chest pain 09/28/2019  . Dyspnea on exertion 09/28/2019  . H/O vitamin D deficiency 01/21/2015  . Osteopenia 01/21/2015  . Vaginal atrophy 01/21/2015  . Mixed hyperlipidemia 02/04/2014  . Family history of ovarian cancer 11/13/2012  . Rectocele 11/07/2012  . Hyperlipidemia 09/29/2012  . Essential hypertension 08/07/2009  . ALLERGIC RHINITIS 08/07/2009  . HYPERSOMNIA WITH SLEEP APNEA UNSPECIFIED 08/04/2009    Current Outpatient Medications  Medication Sig Dispense Refill  . acetaminophen (TYLENOL) 325 MG tablet Take 325 mg by mouth every 6 (six) hours as needed for moderate pain.     [name] amLODipine (NORVASC) 2.5 MG tablet Take 1 tablet (2.5 mg total) by mouth daily. 90 tablet 3  . aspirin EC 81 MG tablet Take 1 tablet (81 mg total) by mouth daily. Swallow whole. 90 tablet 3  . Calcium Carbonate-Vitamin D (CALCIUM + D PO) Take 1 tablet by mouth daily.    . Carboxymethylcellulose Sodium (REFRESH TEARS OP) Place 1 drop into both eyes daily.    . clopidogrel (PLAVIX) 75 MG tablet Take 1 tablet (75 mg total) by mouth daily with breakfast. 90 tablet 2  . ezetimibe (ZETIA) 10 MG tablet TAKE 1 TABLET DAILY 90 tablet 3  . metoprolol succinate (TOPROL-XL) 25 MG 24 hr tablet Take 1 tablet (25 mg total) by mouth daily. 90 tablet 2  . pantoprazole (PROTONIX) 40 MG tablet Take 1 tablet (40 mg total) by mouth 2 (two) times daily before a meal. 60 tablet 1  . rosuvastatin (CRESTOR) 40 MG tablet TAKE 1 TABLET DAILY 90 tablet 3  . sertraline (ZOLOFT) 50 MG tablet Take 1 tablet (50 mg total) by mouth daily. 90 tablet 3  . sodium chloride (OCEAN) 0.65 % SOLN nasal spray Place 1 spray into both nostrils daily.    . traMADol (ULTRAM) 50 MG tablet Take 1 tablet (50 mg total) by mouth as needed. 30 tablet 0  .  neomycin-polymyxin-hydrocortisone (CORTISPORIN) OTIC solution Place 3 drops into the left ear 3 (three) times daily. (Patient not taking: No sig reported) 10 mL 0  . nitroGLYCERIN (NITROSTAT) 0.4 MG SL tablet Place 1 tablet (0.4 mg total) under the tongue every 5 (five) minutes as needed for chest pain. (Patient not taking: Reported on 05/27/2020) 25 tablet 3   No current facility-administered medications for this visit.    Allergies: Bee venom, Simvastatin, Codeine, Evista [raloxifene], and Percocet [oxycodone-acetaminophen]  Past Medical History:  Diagnosis Date  . Cancer ([place])    [facility]-  MELANOMA REMOVED FROM BACK   . Coronary artery disease   . Depression   . Hypercholesterolemia   . Hypertension     Past Surgical History:  Procedure Laterality Date  . ABDOMINAL HYSTERECTOMY     VAGINAL HYSTERECTOMY WITH OVARIAN PRESERVATION   . ABDOMINOPLASTY  1998  . APPENDECTOMY    . BREAST SURGERY  1985   BREAST REDUCTION   . CARDIAC CATHETERIZATION  11/19/2019  . CORONARY BALLOON ANGIOPLASTY N/A 11/19/2019   Procedure: CORONARY BALLOON ANGIOPLASTY;  Surgeon: Lorretta Harp, MD;  Location: [facility];  Service: Cardiovascular;  Laterality: N/A;  . LEFT HEART CATH AND CORONARY ANGIOGRAPHY N/A 11/19/2019   Procedure: LEFT HEART CATH AND CORONARY ANGIOGRAPHY;  Surgeon: Lorretta Harp, MD;  Location: [facility];  Service: Cardiovascular;  Laterality: N/A;  . TONSILLECTOMY AND ADENOIDECTOMY      Family History  Problem Relation Age of Onset  . Hypertension Mother   . Heart disease Father   . Diabetes Father   . Cancer Sister 20       OVARIAN   . Thyroid disease Sister   . Heart disease Brother   . Thyroid disease Daughter   . Diabetes Paternal Grandmother     Social History   Tobacco Use  . Smoking status: Never Smoker  . Smokeless tobacco: Never Used  Substance Use Topics  . Alcohol use: No    Alcohol/week: 0.0 standard drinks    Subjective:    Accompanied by daughter today; was taken by ambulance from church on Sunday; at ER, was found to be very anemic at 7.7; EKG and CXR were done; patient was given transfusion but per patient and family, no discussion about admission; was told to follow-up with PCP; Patient is on Plavix; she denies any rectal bleeding or coughing up coffee grounds; stool test at ER was unremarkable;  Per patient, she has been feeling some better since getting her blood transfusion;  Patient admits that she has been having increased fatigue/ shortness of breath recently but thought it was related to her CAD; had failed stent in the past few months and is actually scheduled to see cardiology later this week;    Objective:  Vitals:   05/27/20 0938  BP: 110/60  Pulse: 75  Temp: 99.5 F (37.5 C)  TempSrc: Oral  SpO2: 97%  Weight: 141 lb 9.6 oz (64.2 kg)  Height: 5' 3"  (1.6 m)    General: Well developed, well nourished, in no acute distress  Skin : Warm and dry.  Head: Normocephalic and atraumatic  Eyes: Sclera and conjunctiva clear; pupils round and reactive to light; extraocular movements intact  Ears: External normal; canals clear; tympanic membranes normal  Oropharynx: Pink, supple. No suspicious lesions  Neck: Supple without thyromegaly, adenopathy  Lungs: Respirations unlabored; clear to auscultation bilaterally without wheeze, rales, rhonchi  CVS exam: normal rate and regular rhythm.  Abdomen: Soft; nontender; nondistended;  Musculoskeletal: No deformities; no active joint inflammation  Extremities: No edema, cyanosis, clubbing  Vessels: Symmetric bilaterally  Neurologic: Alert and oriented; speech intact; face symmetrical; moves all extremities well; CNII-XII intact without focal deficit   Assessment:  1. Iron deficiency anemia, unspecified iron deficiency anemia type   2. History of blood transfusion     Plan:  Patient will start taking OTC Slow FE 325 mg as directed by ER; Rx for Protonix  40 mg bid; Emergent referral to GI- need to determine source of the anemia; Re-check CBC, CMP, ferritin/ TIBC/ iron today; to consider abdominal imaging and hematology evaluation if GI unable to find source of bleed;  Patient and family in agreement; strict ER precautions- to [facility] if needs to go back to ER;  This visit occurred during the SARS-CoV-2 public health emergency.  Safety protocols were in place, including screening questions prior to the visit, additional usage of staff PPE, and extensive cleaning of exam room while observing appropriate contact time as indicated for disinfecting solutions.      No follow-ups on file.  Orders Placed This Encounter  Procedures  . CBC with Differential/Platelet    Standing Status:   Future    Number of Occurrences:   1    Standing Expiration Date:   05/27/2021  . Ferritin    Standing Status:  Future    Number of Occurrences:   1    Standing Expiration Date:   05/27/2021  . Iron,Total/Total Iron Binding Cap    Standing Status:   Future    Number of Occurrences:   1    Standing Expiration Date:   05/27/2021  . Comp Met (CMET)    Standing Status:   Future    Number of Occurrences:   1    Standing Expiration Date:   05/27/2021  . Ambulatory referral to Gastroenterology    Referral Priority:   Emergency    Referral Type:   Consultation    Referral Reason:   Specialty Services Required    Number of Visits Requested:   1    Requested Prescriptions   Signed Prescriptions Disp Refills  . pantoprazole (PROTONIX) 40 MG tablet 60 tablet 1    Sig: Take 1 tablet (40 mg total) by mouth 2 (two) times daily before a meal.

## 2020-05-27 NOTE — Telephone Encounter (Signed)
I have given the pt a call to give her the lab results and she went on to talk about her colonoscopy. Pt reports that daughter called the GI office to get it scheduled and they stated that it is not recommend for people over the age of 5.   Pt does have a visit for the GI scheduled for 06/13/20.  Thanks

## 2020-05-28 ENCOUNTER — Other Ambulatory Visit: Payer: Self-pay | Admitting: Family

## 2020-05-28 DIAGNOSIS — Z9289 Personal history of other medical treatment: Secondary | ICD-10-CM

## 2020-05-28 DIAGNOSIS — D509 Iron deficiency anemia, unspecified: Secondary | ICD-10-CM

## 2020-05-28 LAB — IRON, TOTAL/TOTAL IRON BINDING CAP
%SAT: 7 % (calc) — ABNORMAL LOW (ref 16–45)
Iron: 31 ug/dL — ABNORMAL LOW (ref 45–160)
TIBC: 459 mcg/dL (calc) — ABNORMAL HIGH (ref 250–450)

## 2020-05-28 MED ORDER — POLYSACCHARIDE IRON COMPLEX 150 MG PO CAPS: 150.0000 mg | ORAL_CAPSULE | Freq: Every day | ORAL | 1 refills | Status: DC

## 2020-05-28 NOTE — Progress Notes (Signed)
Reviewed with patient; she understands and agrees to referral to hematology to discuss treatment for anemia/ iron infusions; will also call in Rx for Niferex to use in the interim. She will keep planned follow up with cardiology and GI as well.

## 2020-05-29 ENCOUNTER — Telehealth: Payer: Self-pay | Admitting: Physician Assistant

## 2020-05-29 NOTE — Telephone Encounter (Signed)
Received a new hem referral from Jodi Mourning for [name]. I returned Barbara Maxwell's call and scheduled her to see Cassie on 4/11 at 1:30pm w/labs at 1pm. Pt aware toa rrive 15 minutes early.

## 2020-05-30 ENCOUNTER — Other Ambulatory Visit: Payer: Self-pay

## 2020-05-30 ENCOUNTER — Encounter: Payer: Self-pay | Admitting: Cardiovascular Disease

## 2020-05-30 ENCOUNTER — Ambulatory Visit (INDEPENDENT_AMBULATORY_CARE_PROVIDER_SITE_OTHER): Payer: Medicare Other | Admitting: Cardiovascular Disease

## 2020-05-30 ENCOUNTER — Other Ambulatory Visit: Payer: Self-pay | Admitting: Physician Assistant

## 2020-05-30 DIAGNOSIS — I209 Angina pectoris, unspecified: Secondary | ICD-10-CM | POA: Diagnosis not present

## 2020-05-30 DIAGNOSIS — D539 Nutritional anemia, unspecified: Secondary | ICD-10-CM

## 2020-05-30 DIAGNOSIS — I10 Essential (primary) hypertension: Secondary | ICD-10-CM

## 2020-05-30 DIAGNOSIS — I25118 Atherosclerotic heart disease of native coronary artery with other forms of angina pectoris: Secondary | ICD-10-CM

## 2020-05-30 DIAGNOSIS — E782 Mixed hyperlipidemia: Secondary | ICD-10-CM | POA: Diagnosis not present

## 2020-05-30 DIAGNOSIS — D509 Iron deficiency anemia, unspecified: Secondary | ICD-10-CM

## 2020-05-30 NOTE — Assessment & Plan Note (Signed)
History of CAD status post cardiac catheterization performed by myself 11/19/2019 revealing moderate proximal LAD disease after the first moderate sized first diagonal branch and high-grade subtotally occluded calcified ostial RCA stenosis with grade 2-3 left-to-right collaterals.  I had intended to perform orbital atherectomy but unfortunately after crossing the lesion with a [place]ielder XT wire I never felt convinced I was in the true lumen and aborted the attempt.  I elected to treat her medically and reserve reintervention for recalcitrant symptoms.  She was in church recently and noticed some left upper extremity discomfort.  She took a nitroglycerin after that became diaphoretic and presyncopal.  She is brought to the emergency room for evaluation.  She is found to have a hemoglobin of 7.7 and was transfused 1 unit of packed red blood cells.  Her hemoglobin came back up to 9.1.  Etiology of her anemia is being further worked up.

## 2020-05-30 NOTE — Progress Notes (Signed)
05/30/2020 Barbara Maxwell   [birth year]  [id number]  Primary Physician [name], [place] Primary Cardiologist: Lorretta Harp MD Lupe Carney, [place]  HPI:  Barbara Maxwell is a 85 y.o. mildly overweight widowed Caucasian female (husband of 24 years recently passed away), mother of 3 living children (daughter 36 committed suicide), grandmother 6 grandchildren referred by Dr. Fatima Blank cardiovascular valuation because of atypical chest pain and dyspnea.I last saw her in the office 03/05/2020. Risk factors are only notable for hyperlipidemia treated. Her father apparently did have bypass surgery and she had a son who had a stent. She is never had a heart attack or stroke. She is fairly active and does water aerobics 2 days/week. She is noticed some dyspnea walking up an incline recently and some atypical chest pain. She does admit to anxiety since she is living alone currently.  Iobtaineda 2D echocardiogram on 10/15/2019 which was essentially normal with grade 1 diastolic dysfunction. A coronary calcium score was measured at 1681 with suggestion of proximal to mid RCA disease and LAD disease.  Based on this I performed outpatient diagnostic coronary angiography on her 11/19/2019 with a right radial approach revealing a 60% proximal LAD lesion just after the first diagonal branch which I did not think appeared physiologically significant and a 99% calcified ostial RCA with grade 2-3 left-to-right collaterals. I placed a temporary transvenous pacemaker via the right common femoral vein and did attempt orbital atherectomy PCI and stenting however I was able to cross with a wire but was unable to confirm that I was intraluminal and therefore aborted the procedure. My plan was to treat her medically initially with intentional medications were history of intervention for recalcitrant symptoms  Since I saw her 3 months ago she was seen in the ER after developing left arm pain during  a church service and taking sublingual nitroglycerin.  She became presyncopal.  She was evaluated and found to be anemic with a hemoglobin of 7.7 for unclear reasons and was transfused 1 unit of packed red blood cells resulting in an increase in her hemoglobin to 9.1.  She is on oral iron replacement and is scheduled to have colonoscopy.  When I initially saw her she was having significant dyspnea walking up a hill but this has become less of an issue.   Current Meds  Medication Sig  . acetaminophen (TYLENOL) 325 MG tablet Take 325 mg by mouth every 6 (six) hours as needed for moderate pain.   [name] amLODipine (NORVASC) 2.5 MG tablet Take 1 tablet (2.5 mg total) by mouth daily.  [name] aspirin EC 81 MG tablet Take 1 tablet (81 mg total) by mouth daily. Swallow whole.  . Calcium Carbonate-Vitamin D (CALCIUM + D PO) Take 1 tablet by mouth daily.  . Carboxymethylcellulose Sodium (REFRESH TEARS OP) Place 1 drop into both eyes daily.  . clopidogrel (PLAVIX) 75 MG tablet Take 1 tablet (75 mg total) by mouth daily with breakfast.  . ezetimibe (ZETIA) 10 MG tablet TAKE 1 TABLET DAILY  . ferrous sulfate 325 (65 FE) MG EC tablet Take 325 mg by mouth 3 (three) times daily with meals.  . iron polysaccharides (NIFEREX) 150 MG capsule Take 1 capsule (150 mg total) by mouth daily.  . metoprolol succinate (TOPROL-XL) 25 MG 24 hr tablet Take 1 tablet (25 mg total) by mouth daily.  [name] neomycin-polymyxin-hydrocortisone (CORTISPORIN) OTIC solution Place 3 drops into the left ear 3 (three) times daily.  . nitroGLYCERIN (NITROSTAT) 0.4  MG SL tablet Place 1 tablet (0.4 mg total) under the tongue every 5 (five) minutes as needed for chest pain.  . pantoprazole (PROTONIX) 40 MG tablet Take 1 tablet (40 mg total) by mouth 2 (two) times daily before a meal.  . rosuvastatin (CRESTOR) 40 MG tablet TAKE 1 TABLET DAILY  . sertraline (ZOLOFT) 50 MG tablet Take 1 tablet (50 mg total) by mouth daily.  . sodium chloride (OCEAN) 0.65 % SOLN  nasal spray Place 1 spray into both nostrils daily.  . traMADol (ULTRAM) 50 MG tablet Take 1 tablet (50 mg total) by mouth as needed.     Allergies  Allergen Reactions  . Bee Venom Anaphylaxis  . Simvastatin     Myopathy  . Codeine Nausea Only  . Evista [Raloxifene]     Hot flashes  . Percocet [Oxycodone-Acetaminophen] Nausea And Vomiting    Social History   Socioeconomic History  . Marital status: Widowed    Spouse name: Not on file  . Number of children: Not on file  . Years of education: Not on file  . Highest education level: Not on file  Occupational History  . Not on file  Tobacco Use  . Smoking status: Never Smoker  . Smokeless tobacco: Never Used  Vaping Use  . Vaping Use: Never used  Substance and Sexual Activity  . Alcohol use: No    Alcohol/week: 0.0 standard drinks  . Drug use: Not on file  . Sexual activity: Not Currently    Comment: 1st intercourse- 20, partners- 2, widow  Other Topics Concern  . Not on file  Social History Narrative  . Not on file   Social Determinants of Health   Financial Resource Strain: Not on file  Food Insecurity: Not on file  Transportation Needs: Not on file  Physical Activity: Not on file  Stress: Not on file  Social Connections: Not on file  Intimate Partner Violence: Not on file     Review of Systems: General: negative for chills, fever, night sweats or weight changes.  Cardiovascular: negative for chest pain, dyspnea on exertion, edema, orthopnea, palpitations, paroxysmal nocturnal dyspnea or shortness of breath Dermatological: negative for rash Respiratory: negative for cough or wheezing Urologic: negative for hematuria Abdominal: negative for nausea, vomiting, diarrhea, bright red blood per rectum, melena, or hematemesis Neurologic: negative for visual changes, syncope, or dizziness All other systems reviewed and are otherwise negative except as noted above.    Blood pressure 128/69, pulse 73, height 5\' 3"   (1.6 m), weight 144 lb 6.4 oz (65.5 kg), SpO2 99 %.  General appearance: alert and no distress Neck: no adenopathy, no carotid bruit, no JVD, supple, symmetrical, trachea midline and thyroid not enlarged, symmetric, no tenderness/mass/nodules Lungs: clear to auscultation bilaterally Heart: regular rate and rhythm, S1, S2 normal, no murmur, click, rub or gallop Extremities: extremities normal, atraumatic, no cyanosis or edema Pulses: 2+ and symmetric Skin: Skin color, texture, turgor normal. No rashes or lesions Neurologic: Alert and oriented X 3, normal strength and tone. Normal symmetric reflexes. Normal coordination and gait  EKG not performed today  ASSESSMENT AND PLAN:   Essential hypertension History of essential hypertension a blood pressure measured today 120/69.  She is on amlodipine and metoprolol.  Hyperlipidemia History of hyperlipidemia on Zetia and high-dose rosuvastatin with lipid profile performed 11/20/2019 revealing total cholesterol of 119, LDL 51 and HDL of 45.  CAD (coronary artery disease) History of CAD status post cardiac catheterization performed by myself 11/19/2019 revealing moderate  proximal LAD disease after the first moderate sized first diagonal branch and high-grade subtotally occluded calcified ostial RCA stenosis with grade 2-3 left-to-right collaterals.  I had intended to perform orbital atherectomy but unfortunately after crossing the lesion with a Fielder XT wire I never felt convinced I was in the true lumen and aborted the attempt.  I elected to treat her medically and reserve reintervention for recalcitrant symptoms.  She was in church recently and noticed some left upper extremity discomfort.  She took a nitroglycerin after that became diaphoretic and presyncopal.  She is brought to the emergency room for evaluation.  She is found to have a hemoglobin of 7.7 and was transfused 1 unit of packed red blood cells.  Her hemoglobin came back up to 9.1.  Etiology  of her anemia is being further worked up.      Lorretta Harp MD FACP,FACC,FAHA, [facility] 05/30/2020 11:03 AM

## 2020-05-30 NOTE — Assessment & Plan Note (Signed)
History of essential hypertension a blood pressure measured today 120/69.  She is on amlodipine and metoprolol.

## 2020-05-30 NOTE — Progress Notes (Signed)
[place] Telephone:([phone]) [phone]   [phone]  CONSULT NOTE  REFERRING PHYSICIAN: Jodi Mourning  REASON FOR CONSULTATION:  Anemia  HPI Barbara Maxwell is a 85 y.o. female with a past medical history significant for hypertension, melanoma, CAD, allergic rhinitis, rectocele, osteopenia, and hyperlipidemia is referred to the clinic for evaluation of anemia.   The patient's most recent evaluation began on 05/25/2020 with the patient was at church and she developed an episode of chest pain and left arm numbness.  The patient has a history of coronary artery disease and recently had a cardiac catheterization in December 2021 that revealed significant right coronary artery stenosis. She reports she has a 99% blockage in one of her arteries. Invention was not successful and medication management was recommended. She estimates she has been on plavix since December 2021. Therefore, during this recent episode of chest pain on 05/25/20, the patient took nitroglycerin and immediately began to feel nauseated, dizzy, and diaphoretic.  She was taken to the ER via EMS.  Upon presenting to the emergency room her blood pressure was 102/46, pulse 37, and her hemoglobin was found to be low at 7.7.  She was given IV fluids and 1 unit of RBCs.  She also was instructed to start oral iron supplements. She started 65 mg of iron supplements p.o. daily. She was discharged with close follow-up with cardiology, who she saw on 05/30/20.  The patient followed up with her primary care provider on 05/27/2020 to follow up on the new anemia.  The patient was referred to GI to determine the source of anemia.  She has an appointment with them on 06/13/20. She last had a colonoscopy ~5 years ago under the care of Dr. Earlean Shawl. She states her colonoscopy was "normal". She denies hemorrhoids. Her PCP also was started on prescription Fe and given Protonix 40 mg twice daily.  She had iron studies and ferritin drawn on 05/27/2020  which showed a low ferritin at 8.6, low iron at 31, TIBC elevated at 459, and low saturation 7%. She was subsequently referred to the clinic today for supportive management and consideration of IV iron infusions.   Per chart review, it appears that the anemia is a new problem.  Her CBC records date back to 2015.  She did not have evidence of microcytic anemia until September 2021. She has never required a blood or iron infusion before besides with her recent ER visit. The patient has been taking her oral iron supplement and has been compliant with this. She had never needed to take iron prior to the recent ER visit. She also has been consciously trying to eat more iron rich food such as beats and kale. She reports her fatigue, decreased exercise tolerance, shortness of breath with exertion, and headaches resolved/improved the day following her blood transfusion. She denies craving ice. She reports she sometimes noticed palpitations and mid chest discomfort but not as severe as her incident on 05/25/20.  She does not crave ice. She denies any abnormal bleeding including epistaxis, ginigal bleeding, rectal bleeding, melena, hemoptysis, or hematemesis. She is currently taking plavix and has been taking this for December 2021. She denies NSAID use. She denies abdominal pain. She takes fiber daily and does not report any changes with her bowel habits or constipation. She denies unintentional weight loss. She denies pencil thin stools. She denies fevers, chill, or night sweats. Denies history of bariatric surgery.   Her family history consists of a daughter who  has multiple myeloma.  Her daughter is 77 years old.  The patient's father had heart disease and Parkinson's disease.  The patient's mother passed with age 74 after falling and fracturing her hip.  The patient had 1 sister died in a motor vehicle accident.  The patient has another brother who is 10 who has heart disease.  The patient has another sister who  recently passed away in 2019-01-27 due to ovarian cancer.  Patient denies any family history of colorectal cancer.  The patient is widowed and used to work at [employer].  The patient had 4 children.  She denies any smoking, drug, or alcohol use.    HPI  Past Medical History:  Diagnosis Date  . Cancer ([place])    [facility]-  MELANOMA REMOVED FROM BACK   . Coronary artery disease   . Depression   . Hypercholesterolemia   . Hypertension     Past Surgical History:  Procedure Laterality Date  . ABDOMINAL HYSTERECTOMY     VAGINAL HYSTERECTOMY WITH OVARIAN PRESERVATION   . ABDOMINOPLASTY  1998  . APPENDECTOMY    . BREAST SURGERY  1985   BREAST REDUCTION   . CARDIAC CATHETERIZATION  11/19/2019  . CORONARY BALLOON ANGIOPLASTY N/A 11/19/2019   Procedure: CORONARY BALLOON ANGIOPLASTY;  Surgeon: Lorretta Harp, MD;  Location: [facility];  Service: Cardiovascular;  Laterality: N/A;  . LEFT HEART CATH AND CORONARY ANGIOGRAPHY N/A 11/19/2019   Procedure: LEFT HEART CATH AND CORONARY ANGIOGRAPHY;  Surgeon: Lorretta Harp, MD;  Location: [facility];  Service: Cardiovascular;  Laterality: N/A;  . TONSILLECTOMY AND ADENOIDECTOMY      Family History  Problem Relation Age of Onset  . Hypertension Mother   . Heart disease Father   . Diabetes Father   . Cancer Sister 55       OVARIAN   . Thyroid disease Sister   . Heart disease Brother   . Thyroid disease Daughter   . Diabetes Paternal Grandmother     Social History Social History   Tobacco Use  . Smoking status: Never Smoker  . Smokeless tobacco: Never Used  Vaping Use  . Vaping Use: Never used  Substance Use Topics  . Alcohol use: No    Alcohol/week: 0.0 standard drinks    Allergies  Allergen Reactions  . Bee Venom Anaphylaxis  . Simvastatin     Myopathy  . Codeine Nausea Only  . Evista [Raloxifene]     Hot flashes  . Percocet [Oxycodone-Acetaminophen] Nausea And Vomiting    Current  Outpatient Medications  Medication Sig Dispense Refill  . acetaminophen (TYLENOL) 325 MG tablet Take 325 mg by mouth every 6 (six) hours as needed for moderate pain.     [name] amLODipine (NORVASC) 2.5 MG tablet Take 1 tablet (2.5 mg total) by mouth daily. 90 tablet 3  . aspirin EC 81 MG tablet Take 1 tablet (81 mg total) by mouth daily. Swallow whole. 90 tablet 3  . Calcium Carbonate-Vitamin D (CALCIUM + D PO) Take 1 tablet by mouth daily.    . Carboxymethylcellulose Sodium (REFRESH TEARS OP) Place 1 drop into both eyes daily.    . clopidogrel (PLAVIX) 75 MG tablet Take 1 tablet (75 mg total) by mouth daily with breakfast. 90 tablet 2  . ezetimibe (ZETIA) 10 MG tablet TAKE 1 TABLET DAILY 90 tablet 3  . ferrous sulfate 325 (65 FE) MG EC tablet Take 325 mg by mouth 3 (three) times daily  with meals.    . iron polysaccharides (NIFEREX) 150 MG capsule Take 1 capsule (150 mg total) by mouth daily. 30 capsule 1  . metoprolol succinate (TOPROL-XL) 25 MG 24 hr tablet Take 1 tablet (25 mg total) by mouth daily. 90 tablet 2  . neomycin-polymyxin-hydrocortisone (CORTISPORIN) OTIC solution Place 3 drops into the left ear 3 (three) times daily. 10 mL 0  . nitroGLYCERIN (NITROSTAT) 0.4 MG SL tablet Place 1 tablet (0.4 mg total) under the tongue every 5 (five) minutes as needed for chest pain. 25 tablet 3  . pantoprazole (PROTONIX) 40 MG tablet Take 1 tablet (40 mg total) by mouth 2 (two) times daily before a meal. 60 tablet 1  . rosuvastatin (CRESTOR) 40 MG tablet TAKE 1 TABLET DAILY 90 tablet 3  . sertraline (ZOLOFT) 50 MG tablet Take 1 tablet (50 mg total) by mouth daily. 90 tablet 3  . sodium chloride (OCEAN) 0.65 % SOLN nasal spray Place 1 spray into both nostrils daily.    . traMADol (ULTRAM) 50 MG tablet Take 1 tablet (50 mg total) by mouth as needed. 30 tablet 0   No current facility-administered medications for this visit.    REVIEW OF SYSTEMS:   Review of Systems  Constitutional: Negative for appetite  change, chills, fatigue (improved), fever and unexpected weight change.  HENT: Negative for mouth sores, nosebleeds, sore throat and trouble swallowing.   Eyes: Negative for eye problems and icterus.  Respiratory: Negative for cough, hemoptysis, shortness of breath (improved) and wheezing.   Cardiovascular: Negative for chest pain and leg swelling.  Gastrointestinal: Negative for abdominal pain, constipation, diarrhea, nausea and vomiting.  Genitourinary: Negative for bladder incontinence, difficulty urinating, dysuria, frequency and hematuria.   Musculoskeletal: Negative for back pain, gait problem, neck pain and neck stiffness.  Skin: Negative for itching and rash.  Neurological: Negative for dizziness, extremity weakness, gait problem, headaches, light-headedness and seizures.  Hematological: Negative for adenopathy. Does not bleed easily. Bruises easily but denies large ecchymosis.  Psychiatric/Behavioral: Negative for confusion, depression and sleep disturbance. The patient is not nervous/anxious.     PHYSICAL EXAMINATION:  Blood pressure 130/60, pulse 78, temperature 97.6 F (36.4 C), temperature source Tympanic, resp. rate 16, height _0  (1.6 m), weight 146 lb 4.8 oz (66.4 kg), SpO2 99 %.  ECOG PERFORMANCE STATUS: 1 - Symptomatic but completely ambulatory  Physical Exam  Constitutional: Oriented to person, place, and time and well-developed, well-nourished, and in no distress.  HENT:  Head: Normocephalic and atraumatic.  Mouth/Throat: Oropharynx is clear and moist. No oropharyngeal exudate.  Eyes: Conjunctivae are normal. Right eye exhibits no discharge. Left eye exhibits no discharge. No scleral icterus.  Neck: Normal range of motion. Neck supple.  Cardiovascular: Normal rate, regular rhythm, normal heart sounds and intact distal pulses.   Pulmonary/Chest: Effort normal and breath sounds normal. No respiratory distress. No wheezes. No rales.  Abdominal: Soft. Bowel sounds are  normal. Exhibits no distension and no mass. There is no tenderness.  Musculoskeletal: Normal range of motion. Exhibits no edema.  Lymphadenopathy:    No cervical adenopathy.  Neurological: Alert and oriented to person, place, and time. Exhibits normal muscle tone. Gait normal. Coordination normal.  Skin: Small bruises on forearms. Skin is warm and dry. No rash noted. Not diaphoretic. No erythema. No pallor.  Psychiatric: Mood, memory and judgment normal.  Vitals reviewed.  LABORATORY DATA: Lab Results  Component Value Date   WBC 7.0 06/02/2020   HGB 9.1 (L) 06/02/2020   HCT  30.3 (L) 06/02/2020   MCV 73.9 (L) 06/02/2020   PLT 316 06/02/2020      Chemistry      Component Value Date/Time   NA 143 06/02/2020 1251   NA 141 11/16/2019 1151   K 3.7 06/02/2020 1251   CL 110 06/02/2020 1251   CO2 22 06/02/2020 1251   BUN 15 06/02/2020 1251   BUN 13 11/16/2019 1151   CREATININE 1.01 (H) 06/02/2020 1251   CREATININE 0.77 09/07/2019 0956      Component Value Date/Time   CALCIUM 9.1 06/02/2020 1251   ALKPHOS 53 06/02/2020 1251   AST 21 06/02/2020 1251   ALT 12 06/02/2020 1251   BILITOT 0.8 06/02/2020 1251       RADIOGRAPHIC STUDIES: DG Chest Port 1 View  Result Date: 05/25/2020 CLINICAL DATA:  Chest pain and left arm pain with nausea and dizziness as well as diaphoresis. EXAM: PORTABLE CHEST 1 VIEW COMPARISON:  12/11/2019 and 11/13/2018 FINDINGS: Lungs are adequately inflated and otherwise clear. Cardiomediastinal silhouette and remainder of the exam is unchanged. IMPRESSION: No active disease. Electronically Signed   By: Marin Olp M.D.   On: 05/25/2020 15:24    ASSESSMENT: This is a very pleasant 85 year old Caucasian female referred for clinic for iron deficiency anemia   PLAN: The patient was seen with Dr. Julien Nordmann today.  The patient had several lab studies performed including a CBC, CMP, iron studies, ferritin, SPEP with immunofixation, vitamin B12, and folate.  Her  CBC shows persistent microcytic anemia with a hemoglobin of 9.1.  Her MCV is 73.9.  Patient CMP is unremarkable.  Her folate and SPEP with immunofixation are still pending at this time. Her iron studies show continued iron deficiency.   We will arrange for the patient to have iron infusions with Venofer 300 mg weekly x3.  Her B12 is borderline low. I will arrange for weekly B12 injections x4 then monthly there on after.   We will see the patient back for follow-up visit in 8 weeks for evaluation repeat CBC, iron studies, and ferritin.  In the meantime, the patient was instructed to continue to take her oral iron supplement as well. I provided the patient with a handout of iron rich food. She was instructed to take her iron supplement with vitamin C to help absorption.   The patient was strongly encouraged to follow up with GI to rule out GI blood loss.   The patient voices understanding of current disease status and treatment options and is in agreement with the current care plan.  All questions were answered. The patient knows to call the clinic with any problems, questions or concerns. We can certainly see the patient much sooner if necessary.  Thank you so much for allowing me to participate in the care of [place]. I will continue to follow up the patient with you and assist in her care.   Disclaimer: This note was dictated with voice recognition software. Similar sounding words can inadvertently be transcribed and may not be corrected upon review.   Barbara Maxwell June 02, 2020, 2:57 PM  ADDENDUM: Hematology/Oncology Attending: I had a face-to-face encounter with the patient today.  I reviewed her records, lab and recommended her care plan.  This is a very pleasant 85 years old white female who presented for evaluation of persistent microcytic anemia secondary to iron deficiency.  She has a history of hypertension, coronary artery disease, osteopenia, dyslipidemia  as well as melanoma.  The patient had  an episode of chest pain and left arm numbness in the church and because of her coronary artery disease she took a nitroglycerin followed by nausea, dizziness and diaphoresis.  During her evaluation at the emergency department her hemoglobin was down to 7.7.  The patient received IV hydration as well as 1 unit of PRBCs transfusion.  She started oral iron tablet and she is tolerating over-the-counter medication fairly well. She was seen recently by her primary care physician and referred to [place] for further evaluation of her condition. When seen today the patient is feeling fine with no concerning complaints except for mild fatigue. Repeat blood work today showed persistent anemia with hemoglobin of 9.1 and hematocrit 30.3% with MCV of 73.9%.  Serum ferritin improved to 13 after the patient started the oral iron tablet.  She continues to have low serum iron of 27 with iron saturation of 6% and total iron binding capacity of 470. She was also referred to gastroenterology for evaluation and to rule out any gastrointestinal source of the bleeding. I recommended for the patient to consider iron infusion with Venofer for the next few weeks. We will arrange for the patient to come back for follow-up visit in 2 months for evaluation and repeat CBC, iron study and ferritin. The patient was advised to call immediately if she has any concerning symptoms in the interval.  The total time spent in the appointment was 60 minutes. Disclaimer: This note was dictated with voice recognition software. Similar sounding words can inadvertently be transcribed and may be missed upon review. Eilleen Kempf, MD 06/02/20

## 2020-05-30 NOTE — Assessment & Plan Note (Signed)
History of hyperlipidemia on Zetia and high-dose rosuvastatin with lipid profile performed 11/20/2019 revealing total cholesterol of 119, LDL 51 and HDL of 45.

## 2020-05-30 NOTE — Patient Instructions (Signed)
Medication Instructions:  Your physician recommends that you continue on your current medications as directed. Please refer to the Current Medication list given to you today.  *If you need a refill on your cardiac medications before your next appointment, please call your pharmacy*   Follow-Up: At [facility], you and your health needs are our priority.  As part of our continuing mission to provide you with exceptional heart care, we have created designated Provider Care Teams.  These Care Teams include your primary Cardiologist (physician) and Advanced Practice Providers (APPs -  Physician Assistants and Nurse Practitioners) who all work together to provide you with the care you need, when you need it.  We recommend signing up for the patient portal called "MyChart".  Sign up information is provided on this After Visit Summary.  MyChart is used to connect with patients for Virtual Visits (Telemedicine).  Patients are able to view lab/test results, encounter notes, upcoming appointments, etc.  Non-urgent messages can be sent to your provider as well.   To learn more about what you can do with MyChart, go to [email or web address].    Your next appointment:   3 month(s)  The format for your next appointment:   In Person  Provider:   You will see one of the following Advanced Practice Providers on your designated Care Team:    Sande Rives, PA-C  Coletta Memos, FNP  Then, Quay Burow, MD will plan to see you again in 6 month(s).

## 2020-06-02 ENCOUNTER — Inpatient Hospital Stay: Payer: Medicare Other | Attending: Physician Assistant | Admitting: Physician Assistant

## 2020-06-02 ENCOUNTER — Other Ambulatory Visit: Payer: Self-pay

## 2020-06-02 ENCOUNTER — Encounter: Payer: Self-pay | Admitting: Physician Assistant

## 2020-06-02 ENCOUNTER — Inpatient Hospital Stay: Payer: Medicare Other

## 2020-06-02 VITALS — BP 130/60 | HR 78 | Temp 97.6°F | Resp 16 | Ht 63.0 in | Wt 146.3 lb

## 2020-06-02 DIAGNOSIS — Z833 Family history of diabetes mellitus: Secondary | ICD-10-CM | POA: Diagnosis not present

## 2020-06-02 DIAGNOSIS — M8580 Other specified disorders of bone density and structure, unspecified site: Secondary | ICD-10-CM | POA: Diagnosis not present

## 2020-06-02 DIAGNOSIS — I2510 Atherosclerotic heart disease of native coronary artery without angina pectoris: Secondary | ICD-10-CM | POA: Insufficient documentation

## 2020-06-02 DIAGNOSIS — Z85820 Personal history of malignant melanoma of skin: Secondary | ICD-10-CM | POA: Diagnosis not present

## 2020-06-02 DIAGNOSIS — Z8349 Family history of other endocrine, nutritional and metabolic diseases: Secondary | ICD-10-CM | POA: Diagnosis not present

## 2020-06-02 DIAGNOSIS — R079 Chest pain, unspecified: Secondary | ICD-10-CM | POA: Diagnosis not present

## 2020-06-02 DIAGNOSIS — E538 Deficiency of other specified B group vitamins: Secondary | ICD-10-CM | POA: Diagnosis not present

## 2020-06-02 DIAGNOSIS — Z79899 Other long term (current) drug therapy: Secondary | ICD-10-CM | POA: Insufficient documentation

## 2020-06-02 DIAGNOSIS — Z8249 Family history of ischemic heart disease and other diseases of the circulatory system: Secondary | ICD-10-CM | POA: Insufficient documentation

## 2020-06-02 DIAGNOSIS — L814 Other melanin hyperpigmentation: Secondary | ICD-10-CM | POA: Diagnosis not present

## 2020-06-02 DIAGNOSIS — D539 Nutritional anemia, unspecified: Secondary | ICD-10-CM

## 2020-06-02 DIAGNOSIS — D225 Melanocytic nevi of trunk: Secondary | ICD-10-CM | POA: Diagnosis not present

## 2020-06-02 DIAGNOSIS — E785 Hyperlipidemia, unspecified: Secondary | ICD-10-CM | POA: Insufficient documentation

## 2020-06-02 DIAGNOSIS — L821 Other seborrheic keratosis: Secondary | ICD-10-CM | POA: Diagnosis not present

## 2020-06-02 DIAGNOSIS — R61 Generalized hyperhidrosis: Secondary | ICD-10-CM | POA: Insufficient documentation

## 2020-06-02 DIAGNOSIS — Z8041 Family history of malignant neoplasm of ovary: Secondary | ICD-10-CM | POA: Diagnosis not present

## 2020-06-02 DIAGNOSIS — D509 Iron deficiency anemia, unspecified: Secondary | ICD-10-CM | POA: Diagnosis not present

## 2020-06-02 DIAGNOSIS — D2262 Melanocytic nevi of left upper limb, including shoulder: Secondary | ICD-10-CM | POA: Diagnosis not present

## 2020-06-02 DIAGNOSIS — D1801 Hemangioma of skin and subcutaneous tissue: Secondary | ICD-10-CM | POA: Diagnosis not present

## 2020-06-02 DIAGNOSIS — I10 Essential (primary) hypertension: Secondary | ICD-10-CM | POA: Insufficient documentation

## 2020-06-02 LAB — CMP (CANCER CENTER ONLY)
ALT: 12 U/L (ref 0–44)
AST: 21 U/L (ref 15–41)
Albumin: 4.1 g/dL (ref 3.5–5.0)
Alkaline Phosphatase: 53 U/L (ref 38–126)
Anion gap: 11 (ref 5–15)
BUN: 15 mg/dL (ref 8–23)
CO2: 22 mmol/L (ref 22–32)
Calcium: 9.1 mg/dL (ref 8.9–10.3)
Chloride: 110 mmol/L (ref 98–111)
Creatinine: 1.01 mg/dL — ABNORMAL HIGH (ref 0.44–1.00)
GFR, Estimated: 56 mL/min — ABNORMAL LOW (ref >60)
Glucose, Bld: 153 mg/dL — ABNORMAL HIGH (ref 70–99)
Potassium: 3.7 mmol/L (ref 3.5–5.1)
Sodium: 143 mmol/L (ref 135–145)
Total Bilirubin: 0.8 mg/dL (ref 0.3–1.2)
Total Protein: 7.1 g/dL (ref 6.5–8.1)

## 2020-06-02 LAB — CBC WITH DIFFERENTIAL (CANCER CENTER ONLY)
Abs Immature Granulocytes: 0.02 10*3/uL (ref 0.00–0.07)
Basophils Absolute: 0 10*3/uL (ref 0.0–0.1)
Basophils Relative: 1 %
Eosinophils Absolute: 0.1 10*3/uL (ref 0.0–0.5)
Eosinophils Relative: 2 %
HCT: 30.3 % — ABNORMAL LOW (ref 36.0–46.0)
Hemoglobin: 9.1 g/dL — ABNORMAL LOW (ref 12.0–15.0)
Immature Granulocytes: 0 %
Lymphocytes Relative: 35 %
Lymphs Abs: 2.4 10*3/uL (ref 0.7–4.0)
MCH: 22.2 pg — ABNORMAL LOW (ref 26.0–34.0)
MCHC: 30 g/dL (ref 30.0–36.0)
MCV: 73.9 fL — ABNORMAL LOW (ref 80.0–100.0)
Monocytes Absolute: 0.5 10*3/uL (ref 0.1–1.0)
Monocytes Relative: 7 %
Neutro Abs: 3.9 10*3/uL (ref 1.7–7.7)
Neutrophils Relative %: 55 %
Platelet Count: 316 10*3/uL (ref 150–400)
RBC: 4.1 MIL/uL (ref 3.87–5.11)
RDW: 19.6 % — ABNORMAL HIGH (ref 11.5–15.5)
WBC Count: 7 10*3/uL (ref 4.0–10.5)
nRBC: 0 % (ref 0.0–0.2)

## 2020-06-02 LAB — IRON AND TIBC
Iron: 27 ug/dL — ABNORMAL LOW (ref 41–142)
Saturation Ratios: 6 % — ABNORMAL LOW (ref 21–57)
TIBC: 470 ug/dL — ABNORMAL HIGH (ref 236–444)
UIBC: 443 ug/dL — ABNORMAL HIGH (ref 120–384)

## 2020-06-02 LAB — VITAMIN B12: Vitamin B-12: 199 pg/mL (ref 180–914)

## 2020-06-02 LAB — FERRITIN: Ferritin: 13 ng/mL (ref 11–307)

## 2020-06-02 LAB — FOLATE: Folate: 23 ng/mL (ref >5.9)

## 2020-06-02 NOTE — Patient Instructions (Signed)

## 2020-06-04 ENCOUNTER — Telehealth: Payer: Self-pay | Admitting: Internal Medicine

## 2020-06-04 LAB — PROTEIN ELECTROPHORESIS, SERUM, WITH REFLEX
A/G Ratio: 1.3 (ref 0.7–1.7)
Albumin ELP: 3.9 g/dL (ref 2.9–4.4)
Alpha-1-Globulin: 0.2 g/dL (ref 0.0–0.4)
Alpha-2-Globulin: 0.8 g/dL (ref 0.4–1.0)
Beta Globulin: 1 g/dL (ref 0.7–1.3)
Gamma Globulin: 0.8 g/dL (ref 0.4–1.8)
Globulin, Total: 2.9 g/dL (ref 2.2–3.9)
Total Protein ELP: 6.8 g/dL (ref 6.0–8.5)

## 2020-06-04 NOTE — Telephone Encounter (Signed)
Scheduled appts per los. Called and spoke with patient. She is not willing to go to another location . I will have to work on her schedule to see when the earliest date is to fit her in here. Advised patient I will give her a call back

## 2020-06-05 ENCOUNTER — Ambulatory Visit: Payer: Medicare Other | Admitting: Obstetrics & Gynecology

## 2020-06-12 ENCOUNTER — Ambulatory Visit: Payer: Medicare Other

## 2020-06-13 ENCOUNTER — Ambulatory Visit (INDEPENDENT_AMBULATORY_CARE_PROVIDER_SITE_OTHER): Payer: Medicare Other | Admitting: Gastroenterology

## 2020-06-13 ENCOUNTER — Telehealth: Payer: Self-pay

## 2020-06-13 ENCOUNTER — Encounter: Payer: Self-pay | Admitting: Gastroenterology

## 2020-06-13 VITALS — BP 138/60 | HR 67 | Ht 63.0 in | Wt 145.0 lb

## 2020-06-13 DIAGNOSIS — D509 Iron deficiency anemia, unspecified: Secondary | ICD-10-CM

## 2020-06-13 DIAGNOSIS — I209 Angina pectoris, unspecified: Secondary | ICD-10-CM | POA: Diagnosis not present

## 2020-06-13 DIAGNOSIS — Z7902 Long term (current) use of antithrombotics/antiplatelets: Secondary | ICD-10-CM

## 2020-06-13 MED ORDER — CLENPIQ 10-3.5-12 MG-GM -GM/160ML PO SOLN: 1.0000 | Freq: Once | ORAL | 0 refills | Status: AC

## 2020-06-13 NOTE — Progress Notes (Signed)
06/13/2020 Barbara Maxwell [id number] 19-Mar-1935   HISTORY OF PRESENT ILLNESS:  This is a pleasant 85 year old female who is new to our practice.  She is here today at the request of [name], FNP, for evaluation of IDA.  7 months ago her hemoglobin was 10.4 g then 2 weeks ago was found to be 7.7 g.  She was transfused with 1 unit of packed red blood cells.  Her iron studies are low and vitamin B12 is borderline low.  There are plans to have her receive IV iron infusions and she is now taking oral iron supplements once daily.  She is also going to be receiving vitamin B12 injections.  She tells me that her last colonoscopy was about 7 years ago, when she was 85 years old, with Dr. Earlean Shawl.  She says that she has had 2 colonoscopies, one when she was 85 years old and one when she was 85 years old.  She has never had an endoscopy.  She tells me that she does not see any blood in her stools and she has not had any dark stools that she has thought to be an issue.  They are dark now that she is on the iron.  She is actually Hemoccult negative.  She denies really any GI complaints.  She is on Plavix and that is controlled by her cardiologist, Dr. Gwenlyn Found, for history of coronary artery disease.   Past Medical History:  Diagnosis Date  . Anemia   . B12 deficiency   . Cancer ([place])    [facility]-  MELANOMA REMOVED FROM BACK   . Coronary artery disease   . Depression   . Hypercholesterolemia   . Hypertension    Past Surgical History:  Procedure Laterality Date  . ABDOMINAL HYSTERECTOMY     VAGINAL HYSTERECTOMY WITH OVARIAN PRESERVATION   . ABDOMINOPLASTY  1998  . APPENDECTOMY    . BREAST SURGERY  1985   BREAST REDUCTION   . CARDIAC CATHETERIZATION  11/19/2019  . CORONARY BALLOON ANGIOPLASTY N/A 11/19/2019   Procedure: CORONARY BALLOON ANGIOPLASTY;  Surgeon: Lorretta Harp, MD;  Location: [facility];  Service: Cardiovascular;  Laterality: N/A;  . LEFT HEART CATH AND  CORONARY ANGIOGRAPHY N/A 11/19/2019   Procedure: LEFT HEART CATH AND CORONARY ANGIOGRAPHY;  Surgeon: Lorretta Harp, MD;  Location: [facility];  Service: Cardiovascular;  Laterality: N/A;  . TONSILLECTOMY AND ADENOIDECTOMY      reports that she has never smoked. She has never used smokeless tobacco. She reports that she does not drink alcohol. No history on file for drug use. family history includes Cancer (age of onset: 48) in her sister; Diabetes in her father and paternal grandmother; Heart disease in her brother and father; Hypertension in her mother; Thyroid disease in her daughter and sister. Allergies  Allergen Reactions  . Bee Venom Anaphylaxis  . Simvastatin     Myopathy  . Codeine Nausea Only  . Evista [Raloxifene]     Hot flashes  . Percocet [Oxycodone-Acetaminophen] Nausea And Vomiting      Outpatient Encounter Medications as of 06/13/2020  Medication Sig  . acetaminophen (TYLENOL) 325 MG tablet Take 325 mg by mouth every 6 (six) hours as needed for moderate pain.   [name] amLODipine (NORVASC) 2.5 MG tablet Take 1 tablet (2.5 mg total) by mouth daily.  [name] aspirin EC 81 MG tablet Take 1 tablet (81 mg total) by mouth daily. Swallow whole.  [name]  Calcium Carbonate-Vitamin D (CALCIUM + D PO) Take 1 tablet by mouth daily.  . Carboxymethylcellulose Sodium (REFRESH TEARS OP) Place 1 drop into both eyes daily.  . clopidogrel (PLAVIX) 75 MG tablet Take 1 tablet (75 mg total) by mouth daily with breakfast.  . ezetimibe (ZETIA) 10 MG tablet TAKE 1 TABLET DAILY  . ferrous sulfate 325 (65 FE) MG EC tablet Take 325 mg by mouth daily with breakfast.  . iron polysaccharides (NIFEREX) 150 MG capsule Take 1 capsule (150 mg total) by mouth daily.  . metoprolol succinate (TOPROL-XL) 25 MG 24 hr tablet Take 1 tablet (25 mg total) by mouth daily.  [name] neomycin-polymyxin-hydrocortisone (CORTISPORIN) OTIC solution Place 3 drops into the left ear 3 (three) times daily.  . nitroGLYCERIN (NITROSTAT) 0.4  MG SL tablet Place 1 tablet (0.4 mg total) under the tongue every 5 (five) minutes as needed for chest pain.  . pantoprazole (PROTONIX) 40 MG tablet Take 1 tablet (40 mg total) by mouth 2 (two) times daily before a meal.  . rosuvastatin (CRESTOR) 40 MG tablet TAKE 1 TABLET DAILY  . sertraline (ZOLOFT) 50 MG tablet Take 1 tablet (50 mg total) by mouth daily.  . sodium chloride (OCEAN) 0.65 % SOLN nasal spray Place 1 spray into both nostrils daily.  . traMADol (ULTRAM) 50 MG tablet Take 1 tablet (50 mg total) by mouth as needed.   No facility-administered encounter medications on file as of 06/13/2020.     REVIEW OF SYSTEMS  : All other systems reviewed and negative except where noted in the History of Present Illness.   PHYSICAL EXAM: BP 138/60   Pulse 67   Ht 5\' 3"  (1.6 m)   Wt 145 lb (65.8 kg)   BMI 25.69 kg/m  General: Well developed white female in no acute distress Head: Normocephalic and atraumatic Eyes:  Sclerae anicteric, conjunctiva pink. Ears: Normal auditory acuity Lungs: Clear throughout to auscultation; no W/R/R. Heart: Regular rate and rhythm; no M/R/G. Abdomen: Soft, non-distended.  BS present.  Non-tender. Rectal:  Will be done at the time of colonoscopy. Musculoskeletal: Symmetrical with no gross deformities  Skin: No lesions on visible extremities Extremities: No edema  Neurological: Alert oriented x 4, grossly non-focal Psychological:  Alert and cooperative. Normal mood and affect  ASSESSMENT AND PLAN: *IDA:  Hgb declining over the past several months, down to 7.7 grams.  Iron studies low and Vitamin B12 borderline low.  She received one unit PRBCs and is going to receive IV iron infusions and Vitamin B12 injections.  Is also on oral iron once a day.  She is heme negative and denies any overt sign of GI bleeding.  Last colonoscopy about 7 years ago and has never had an EGD.  Will plan for both of those with Dr. Ardis Hughs. *Antiplatelet use with Plavix for CAD:  Hold  Plavix for 5 days before procedure - will instruct when and how to resume after procedure. Risks and benefits of procedure including bleeding, perforation, infection, missed lesions, medication reactions and possible hospitalization or surgery if complications occur explained. Additional rare but real risk of cardiovascular event such as heart attack or ischemia/infarct of other organs off of Plavix explained and need to seek urgent help if this occurs. Will communicate by phone or EMR with patient's prescribing provider that to confirm holding Plavix is reasonable in this case.   CC:  [name],*

## 2020-06-13 NOTE — Telephone Encounter (Signed)
I have no objections to her holding her Plavix for 5 days prior to her endoscopy.

## 2020-06-13 NOTE — Patient Instructions (Signed)
If you are age 83 or older, your body mass index should be between 23-30. Your Body mass index is 25.69 kg/m. If this is out of the aforementioned range listed, please consider follow up with your Primary Care Provider.  If you are age 14 or younger, your body mass index should be between 19-25. Your Body mass index is 25.69 kg/m. If this is out of the aformentioned range listed, please consider follow up with your Primary Care Provider.   You have been scheduled for an endoscopy and colonoscopy. Please follow the written instructions given to you at your visit today. Please pick up your prep supplies at the pharmacy within the next 1-3 days. If you use inhalers (even only as needed), please bring them with you on the day of your procedure.

## 2020-06-13 NOTE — Telephone Encounter (Signed)
    Barbara Maxwell DOB:  Jul 25, 1935  MRN:  [id number]   Primary Cardiologist: Quay Burow, MD  Chart reviewed as part of pre-operative protocol coverage.   Patient has a history of CAD with 60% mLAD stenosis and 99% pRCA stenosis with aborted atherectomy attempt on [facility] 10/2019.   She was last seen by cardiology at an outpatient visit with Dr. Gwenlyn Found 05/30/20 and had improvement in [place]. Recently found to be anemic requiring transfusion and is planned for EGD/C-scope to further evaluate.   Dr. Gwenlyn Found - any objections to holding plavix 5 days prior to her procedure? Please route your response back to [facility] PREOP. Thanks!  Abigail Butts, PA-C 06/13/2020, 1:03 PM

## 2020-06-13 NOTE — Telephone Encounter (Signed)
[facility] Pre-operative Risk Assessment     Request for surgical clearance:     Endoscopy Procedure  What type of surgery is being performed?     Endo/Colon  When is this surgery scheduled?     08/27/2020  What type of clearance is required ?   Pharmacy  Are there any medications that need to be held prior to surgery and how long? Plavix - 5 days  Practice name and name of physician performing surgery?      [facility]  What is your office phone and fax number?      Phone- [phone]  [phone]  Anesthesia type (None, local, MAC, general) ?       MAC

## 2020-06-13 NOTE — Progress Notes (Signed)
I agree with the above note, plan 

## 2020-06-13 NOTE — Telephone Encounter (Signed)
    Barbara Maxwell DOB:  11-20-1935  MRN:  [id number]   Primary Cardiologist: Quay Burow, MD  Chart reviewed as part of pre-operative protocol coverage.  Per Dr. Gwenlyn Found, patient can hold plavix 5 days prior to her upcoming EGD/C-scope with plans to restart as soon as she is cleared to do so by her gastroenterologist.   I will route this recommendation to the requesting party via [place] fax function and remove from pre-op pool.  Please call with questions.  Abigail Butts, PA-C 06/13/2020, 2:01 PM

## 2020-06-16 ENCOUNTER — Telehealth: Payer: Self-pay

## 2020-06-16 NOTE — Telephone Encounter (Signed)
Lm on vm that patient could hold her Plavix for 5 days prior to her procedure.  Asked her to call and let me know she received this information.

## 2020-06-19 ENCOUNTER — Other Ambulatory Visit: Payer: Self-pay

## 2020-06-19 ENCOUNTER — Ambulatory Visit: Payer: Medicare Other

## 2020-06-19 ENCOUNTER — Inpatient Hospital Stay: Payer: Medicare Other

## 2020-06-19 VITALS — BP 134/57 | HR 55 | Temp 98.2°F | Resp 16

## 2020-06-19 DIAGNOSIS — I2510 Atherosclerotic heart disease of native coronary artery without angina pectoris: Secondary | ICD-10-CM | POA: Diagnosis not present

## 2020-06-19 DIAGNOSIS — M8580 Other specified disorders of bone density and structure, unspecified site: Secondary | ICD-10-CM | POA: Diagnosis not present

## 2020-06-19 DIAGNOSIS — D509 Iron deficiency anemia, unspecified: Secondary | ICD-10-CM | POA: Diagnosis not present

## 2020-06-19 DIAGNOSIS — R079 Chest pain, unspecified: Secondary | ICD-10-CM | POA: Diagnosis not present

## 2020-06-19 DIAGNOSIS — E785 Hyperlipidemia, unspecified: Secondary | ICD-10-CM | POA: Diagnosis not present

## 2020-06-19 DIAGNOSIS — I10 Essential (primary) hypertension: Secondary | ICD-10-CM | POA: Diagnosis not present

## 2020-06-19 MED ORDER — ACETAMINOPHEN 325 MG PO TABS
650.0000 mg | ORAL_TABLET | Freq: Once | ORAL | Status: AC
Start: 2020-06-19 — End: 2020-06-19
  Administered 2020-06-19: 650 mg via ORAL

## 2020-06-19 MED ORDER — SODIUM CHLORIDE 0.9 % IV SOLN
300.0000 mg | Freq: Once | INTRAVENOUS | Status: AC
  Administered 2020-06-19: 300 mg via INTRAVENOUS
  Filled 2020-06-19: qty 300

## 2020-06-19 MED ORDER — CYANOCOBALAMIN 1000 MCG/ML IJ SOLN
1000.0000 ug | Freq: Once | INTRAMUSCULAR | Status: AC
Start: 2020-06-19 — End: 2020-06-19
  Administered 2020-06-19: 1000 ug via INTRAMUSCULAR

## 2020-06-19 MED ORDER — SODIUM CHLORIDE 0.9 % IV SOLN
Freq: Once | INTRAVENOUS | Status: AC
Start: 2020-06-19 — End: 2020-06-19
  Filled 2020-06-19: qty 250

## 2020-06-19 MED ORDER — DIPHENHYDRAMINE HCL 25 MG PO CAPS
25.0000 mg | ORAL_CAPSULE | Freq: Once | ORAL | Status: AC
Start: 2020-06-19 — End: 2020-06-19
  Administered 2020-06-19: 25 mg via ORAL

## 2020-06-19 MED ORDER — DIPHENHYDRAMINE HCL 25 MG PO CAPS
ORAL_CAPSULE | ORAL | Status: AC
  Filled 2020-06-19: qty 1

## 2020-06-19 MED ORDER — ACETAMINOPHEN 325 MG PO TABS
ORAL_TABLET | ORAL | Status: AC
  Filled 2020-06-19: qty 2

## 2020-06-19 MED ORDER — CYANOCOBALAMIN 1000 MCG/ML IJ SOLN
INTRAMUSCULAR | Status: AC
  Filled 2020-06-19: qty 1

## 2020-06-19 NOTE — Patient Instructions (Signed)
Iron Sucrose injection What is this medicine? IRON SUCROSE (AHY ern SOO krohs) is an iron complex. Iron is used to make healthy red blood cells, which carry oxygen and nutrients throughout the body. This medicine is used to treat iron deficiency anemia in people with chronic kidney disease. This medicine may be used for other purposes; ask your health care provider or pharmacist if you have questions. COMMON BRAND NAME(S): Venofer What should I tell my health care provider before I take this medicine? They need to know if you have any of these conditions:  anemia not caused by low iron levels  heart disease  high levels of iron in the blood  kidney disease  liver disease  an unusual or allergic reaction to iron, other medicines, foods, dyes, or preservatives  pregnant or trying to get pregnant  breast-feeding How should I use this medicine? This medicine is for infusion into a vein. It is given by a health care professional in a hospital or clinic setting. Talk to your pediatrician regarding the use of this medicine in children. While this drug may be prescribed for children as young as 2 years for selected conditions, precautions do apply. Overdosage: If you think you have taken too much of this medicine contact a poison control center or emergency room at once. NOTE: This medicine is only for you. Do not share this medicine with others. What if I miss a dose? It is important not to miss your dose. Call your doctor or health care professional if you are unable to keep an appointment. What may interact with this medicine? Do not take this medicine with any of the following medications:  deferoxamine  dimercaprol  other iron products This medicine may also interact with the following medications:  chloramphenicol  deferasirox This list may not describe all possible interactions. Give your health care provider a list of all the medicines, herbs, non-prescription drugs, or  dietary supplements you use. Also tell them if you smoke, drink alcohol, or use illegal drugs. Some items may interact with your medicine. What should I watch for while using this medicine? Visit your doctor or healthcare professional regularly. Tell your doctor or healthcare professional if your symptoms do not start to get better or if they get worse. You may need blood work done while you are taking this medicine. You may need to follow a special diet. Talk to your doctor. Foods that contain iron include: whole grains/cereals, dried fruits, beans, or peas, leafy green vegetables, and organ meats (liver, kidney). What side effects may I notice from receiving this medicine? Side effects that you should report to your doctor or health care professional as soon as possible:  allergic reactions like skin rash, itching or hives, swelling of the face, lips, or tongue  breathing problems  changes in blood pressure  cough  fast, irregular heartbeat  feeling faint or lightheaded, falls  fever or chills  flushing, sweating, or hot feelings  joint or muscle aches/pains  seizures  swelling of the ankles or feet  unusually weak or tired Side effects that usually do not require medical attention (report to your doctor or health care professional if they continue or are bothersome):  diarrhea  feeling achy  headache  irritation at site where injected  nausea, vomiting  stomach upset  tiredness This list may not describe all possible side effects. Call your doctor for medical advice about side effects. You may report side effects to FDA at 1-800-FDA-1088. Where should I keep   my medicine? This drug is given in a hospital or clinic and will not be stored at home. NOTE: This sheet is a summary. It may not cover all possible information. If you have questions about this medicine, talk to your doctor, pharmacist, or health care provider.  2021 Elsevier/Gold Standard (2010-11-19  17:14:35)  Cyanocobalamin, Vitamin B12 injection What is this medicine? CYANOCOBALAMIN (sye an oh koe BAL a min) is a man made form of vitamin B12. Vitamin B12 is used in the growth of healthy blood cells, nerve cells, and proteins in the body. It also helps with the metabolism of fats and carbohydrates. This medicine is used to treat people who can not absorb vitamin B12. This medicine may be used for other purposes; ask your health care provider or pharmacist if you have questions. COMMON BRAND NAME(S): B-12 Compliance Kit, B-12 Injection Kit, Cyomin, LA-12, Nutri-Twelve, Physicians EZ Use B-12, Primabalt What should I tell my health care provider before I take this medicine? They need to know if you have any of these conditions:  kidney disease  Leber's disease  megaloblastic anemia  an unusual or allergic reaction to cyanocobalamin, cobalt, other medicines, foods, dyes, or preservatives  pregnant or trying to get pregnant  breast-feeding How should I use this medicine? This medicine is injected into a muscle or deeply under the skin. It is usually given by a health care professional in a clinic or doctor's office. However, your doctor may teach you how to inject yourself. Follow all instructions. Talk to your pediatrician regarding the use of this medicine in children. Special care may be needed. Overdosage: If you think you have taken too much of this medicine contact a poison control center or emergency room at once. NOTE: This medicine is only for you. Do not share this medicine with others. What if I miss a dose? If you are given your dose at a clinic or doctor's office, call to reschedule your appointment. If you give your own injections and you miss a dose, take it as soon as you can. If it is almost time for your next dose, take only that dose. Do not take double or extra doses. What may interact with this medicine?  colchicine  heavy alcohol intake This list may not  describe all possible interactions. Give your health care provider a list of all the medicines, herbs, non-prescription drugs, or dietary supplements you use. Also tell them if you smoke, drink alcohol, or use illegal drugs. Some items may interact with your medicine. What should I watch for while using this medicine? Visit your doctor or health care professional regularly. You may need blood work done while you are taking this medicine. You may need to follow a special diet. Talk to your doctor. Limit your alcohol intake and avoid smoking to get the best benefit. What side effects may I notice from receiving this medicine? Side effects that you should report to your doctor or health care professional as soon as possible:  allergic reactions like skin rash, itching or hives, swelling of the face, lips, or tongue  blue tint to skin  chest tightness, pain  difficulty breathing, wheezing  dizziness  red, swollen painful area on the leg Side effects that usually do not require medical attention (report to your doctor or health care professional if they continue or are bothersome):  diarrhea  headache This list may not describe all possible side effects. Call your doctor for medical advice about side effects. You may report side  to FDA at 1-800-FDA-1088. Where should I keep my medicine? Keep out of the reach of children. Store at room temperature between 15 and 30 degrees C (59 and 85 degrees F). Protect from light. Throw away any unused medicine after the expiration date. NOTE: This sheet is a summary. It may not cover all possible information. If you have questions about this medicine, talk to your doctor, pharmacist, or health care provider.  2021 Elsevier/Gold Standard (2007-05-22 22:10:20)  

## 2020-06-26 NOTE — Telephone Encounter (Signed)
Spoke with patient who understands to hold her Plavix for five days prior to her procedure

## 2020-07-02 ENCOUNTER — Other Ambulatory Visit: Payer: Self-pay

## 2020-07-02 ENCOUNTER — Telehealth: Payer: Self-pay | Admitting: Family

## 2020-07-02 ENCOUNTER — Inpatient Hospital Stay: Payer: Medicare Other | Attending: Physician Assistant

## 2020-07-02 VITALS — BP 121/47 | HR 54 | Temp 98.1°F | Resp 16

## 2020-07-02 DIAGNOSIS — D509 Iron deficiency anemia, unspecified: Secondary | ICD-10-CM | POA: Diagnosis not present

## 2020-07-02 DIAGNOSIS — E538 Deficiency of other specified B group vitamins: Secondary | ICD-10-CM | POA: Insufficient documentation

## 2020-07-02 MED ORDER — CYANOCOBALAMIN 1000 MCG/ML IJ SOLN
INTRAMUSCULAR | Status: AC
  Filled 2020-07-02: qty 1

## 2020-07-02 MED ORDER — SODIUM CHLORIDE 0.9 % IV SOLN
Freq: Once | INTRAVENOUS | Status: AC
  Filled 2020-07-02: qty 250

## 2020-07-02 MED ORDER — CYANOCOBALAMIN 1000 MCG/ML IJ SOLN
1000.0000 ug | Freq: Once | INTRAMUSCULAR | Status: AC
  Administered 2020-07-02: 1000 ug via INTRAMUSCULAR

## 2020-07-02 MED ORDER — DIPHENHYDRAMINE HCL 25 MG PO CAPS
ORAL_CAPSULE | ORAL | Status: AC
  Filled 2020-07-02: qty 1

## 2020-07-02 MED ORDER — ACETAMINOPHEN 325 MG PO TABS
650.0000 mg | ORAL_TABLET | Freq: Once | ORAL | Status: AC
  Administered 2020-07-02: 650 mg via ORAL

## 2020-07-02 MED ORDER — DIPHENHYDRAMINE HCL 25 MG PO CAPS
25.0000 mg | ORAL_CAPSULE | Freq: Once | ORAL | Status: AC
Start: 2020-07-02 — End: 2020-07-02
  Administered 2020-07-02: 25 mg via ORAL

## 2020-07-02 MED ORDER — IRON SUCROSE 20 MG/ML IV SOLN
300.0000 mg | Freq: Once | INTRAVENOUS | Status: AC
  Administered 2020-07-02: 300 mg via INTRAVENOUS
  Filled 2020-07-02: qty 300

## 2020-07-02 MED ORDER — ACETAMINOPHEN 325 MG PO TABS
ORAL_TABLET | ORAL | Status: AC
  Filled 2020-07-02: qty 2

## 2020-07-02 NOTE — Patient Instructions (Signed)
[facility]  Discharge Instructions: Thank you for choosing [facility] to provide your oncology and hematology care.   If you have a lab appointment with the [facility], please go directly to the [facility] and check in at the registration area.   Wear comfortable clothing and clothing appropriate for easy access to any Portacath or PICC line.   We strive to give you quality time with your provider. You may need to reschedule your appointment if you arrive late (15 or more minutes).  Arriving late affects you and other patients whose appointments are after yours.  Also, if you miss three or more appointments without notifying the office, you may be dismissed from the clinic at the provider's discretion.      For prescription refill requests, have your pharmacy contact our office and allow 72 hours for refills to be completed.    Today you received venofer   To help prevent nausea and vomiting after your treatment, we encourage you to take your nausea medication as directed.  BELOW ARE SYMPTOMS THAT SHOULD BE REPORTED IMMEDIATELY: . *FEVER GREATER THAN 100.4 F (38 C) OR HIGHER . *CHILLS OR SWEATING . *NAUSEA AND VOMITING THAT IS NOT CONTROLLED WITH YOUR NAUSEA MEDICATION . *UNUSUAL SHORTNESS OF BREATH . *UNUSUAL BRUISING OR BLEEDING . *URINARY PROBLEMS (pain or burning when urinating, or frequent urination) . *BOWEL PROBLEMS (unusual diarrhea, constipation, pain near the anus) . TENDERNESS IN MOUTH AND THROAT WITH OR WITHOUT PRESENCE OF ULCERS (sore throat, sores in mouth, or a toothache) . UNUSUAL RASH, SWELLING OR PAIN  . UNUSUAL VAGINAL DISCHARGE OR ITCHING   Items with * indicate a potential emergency and should be followed up as soon as possible or go to the Emergency Department if any problems should occur.  Please show the CHEMOTHERAPY ALERT CARD or IMMUNOTHERAPY ALERT CARD at check-in to the Emergency Department and triage  nurse.  Should you have questions after your visit or need to cancel or reschedule your appointment, please contact [facility]  Dept: [phone]  and follow the prompts.  Office hours are 8:00 a.m. to 4:30 p.m. Monday - Friday. Please note that voicemails left after 4:00 p.m. may not be returned until the following business day.  We are closed weekends and major holidays. You have access to a nurse at all times for urgent questions. Please call the main number to the clinic Dept: [phone] and follow the prompts.   For any non-urgent questions, you may also contact your provider using MyChart. We now offer e-Visits for anyone 23 and older to request care online for non-urgent symptoms. For details visit [email or web address].   Also download the MyChart app! Go to the app store, search "MyChart", open the app, select [facility], and log in with your MyChart username and password.  Due to Covid, a mask is required upon entering the hospital/clinic. If you do not have a mask, one will be given to you upon arrival. For doctor visits, patients may have 1 support person aged 21 or older with them. For treatment visits, patients cannot have anyone with them due to current Covid guidelines and our immunocompromised population.

## 2020-07-02 NOTE — Progress Notes (Signed)
Pt declined to stay for 30 min after iron infusion, VSS throughout treatment, pt stable at discharge, ambulatory to lobby.

## 2020-07-02 NOTE — Telephone Encounter (Signed)
LVM for pt to rtn my call to schedule awv with nha. Please schedule this appt if pt calls the office.

## 2020-07-09 ENCOUNTER — Other Ambulatory Visit: Payer: Self-pay

## 2020-07-09 ENCOUNTER — Inpatient Hospital Stay: Payer: Medicare Other

## 2020-07-09 ENCOUNTER — Ambulatory Visit: Payer: Medicare Other

## 2020-07-09 VITALS — BP 120/62 | HR 66 | Temp 99.1°F | Resp 18

## 2020-07-09 DIAGNOSIS — D509 Iron deficiency anemia, unspecified: Secondary | ICD-10-CM

## 2020-07-09 DIAGNOSIS — E538 Deficiency of other specified B group vitamins: Secondary | ICD-10-CM | POA: Diagnosis not present

## 2020-07-09 MED ORDER — DIPHENHYDRAMINE HCL 25 MG PO CAPS
25.0000 mg | ORAL_CAPSULE | Freq: Once | ORAL | Status: AC
  Administered 2020-07-09: 25 mg via ORAL

## 2020-07-09 MED ORDER — ACETAMINOPHEN 325 MG PO TABS
ORAL_TABLET | ORAL | Status: AC
  Filled 2020-07-09: qty 2

## 2020-07-09 MED ORDER — SODIUM CHLORIDE 0.9 % IV SOLN
Freq: Once | INTRAVENOUS | Status: AC
  Filled 2020-07-09: qty 250

## 2020-07-09 MED ORDER — CYANOCOBALAMIN 1000 MCG/ML IJ SOLN
INTRAMUSCULAR | Status: AC
  Filled 2020-07-09: qty 1

## 2020-07-09 MED ORDER — ACETAMINOPHEN 325 MG PO TABS
650.0000 mg | ORAL_TABLET | Freq: Once | ORAL | Status: AC
  Administered 2020-07-09: 650 mg via ORAL

## 2020-07-09 MED ORDER — CYANOCOBALAMIN 1000 MCG/ML IJ SOLN
1000.0000 ug | Freq: Once | INTRAMUSCULAR | Status: AC
Start: 2020-07-09 — End: 2020-07-09
  Administered 2020-07-09: 1000 ug via INTRAMUSCULAR

## 2020-07-09 MED ORDER — SODIUM CHLORIDE 0.9 % IV SOLN
300.0000 mg | Freq: Once | INTRAVENOUS | Status: AC
  Administered 2020-07-09: 300 mg via INTRAVENOUS
  Filled 2020-07-09: qty 300

## 2020-07-09 MED ORDER — DIPHENHYDRAMINE HCL 25 MG PO CAPS
ORAL_CAPSULE | ORAL | Status: AC
  Filled 2020-07-09: qty 1

## 2020-07-20 ENCOUNTER — Other Ambulatory Visit: Payer: Self-pay | Admitting: Family

## 2020-07-28 ENCOUNTER — Encounter: Payer: Self-pay | Admitting: Physician Assistant

## 2020-08-04 ENCOUNTER — Other Ambulatory Visit: Payer: Self-pay

## 2020-08-04 ENCOUNTER — Inpatient Hospital Stay: Payer: Medicare Other | Attending: Physician Assistant | Admitting: Internal Medicine

## 2020-08-04 ENCOUNTER — Inpatient Hospital Stay: Payer: Medicare Other

## 2020-08-04 ENCOUNTER — Encounter: Payer: Self-pay | Admitting: Internal Medicine

## 2020-08-04 VITALS — BP 118/59 | HR 64 | Temp 98.4°F | Resp 18 | Ht 63.0 in | Wt 141.7 lb

## 2020-08-04 DIAGNOSIS — K922 Gastrointestinal hemorrhage, unspecified: Secondary | ICD-10-CM | POA: Diagnosis not present

## 2020-08-04 DIAGNOSIS — D509 Iron deficiency anemia, unspecified: Secondary | ICD-10-CM

## 2020-08-04 DIAGNOSIS — I2510 Atherosclerotic heart disease of native coronary artery without angina pectoris: Secondary | ICD-10-CM | POA: Insufficient documentation

## 2020-08-04 DIAGNOSIS — Z79899 Other long term (current) drug therapy: Secondary | ICD-10-CM | POA: Diagnosis not present

## 2020-08-04 DIAGNOSIS — D500 Iron deficiency anemia secondary to blood loss (chronic): Secondary | ICD-10-CM | POA: Diagnosis not present

## 2020-08-04 DIAGNOSIS — E538 Deficiency of other specified B group vitamins: Secondary | ICD-10-CM

## 2020-08-04 LAB — VITAMIN B12: Vitamin B-12: 409 pg/mL (ref 180–914)

## 2020-08-04 LAB — CBC WITH DIFFERENTIAL (CANCER CENTER ONLY)
Abs Immature Granulocytes: 0.01 10*3/uL (ref 0.00–0.07)
Basophils Absolute: 0 10*3/uL (ref 0.0–0.1)
Basophils Relative: 1 %
Eosinophils Absolute: 0.2 10*3/uL (ref 0.0–0.5)
Eosinophils Relative: 4 %
HCT: 38.6 % (ref 36.0–46.0)
Hemoglobin: 12.3 g/dL (ref 12.0–15.0)
Immature Granulocytes: 0 %
Lymphocytes Relative: 42 %
Lymphs Abs: 2.1 10*3/uL (ref 0.7–4.0)
MCH: 26.1 pg (ref 26.0–34.0)
MCHC: 31.9 g/dL (ref 30.0–36.0)
MCV: 81.8 fL (ref 80.0–100.0)
Monocytes Absolute: 0.4 10*3/uL (ref 0.1–1.0)
Monocytes Relative: 7 %
Neutro Abs: 2.4 10*3/uL (ref 1.7–7.7)
Neutrophils Relative %: 46 %
Platelet Count: 237 10*3/uL (ref 150–400)
RBC: 4.72 MIL/uL (ref 3.87–5.11)
RDW: 23.4 % — ABNORMAL HIGH (ref 11.5–15.5)
WBC Count: 5.1 10*3/uL (ref 4.0–10.5)
nRBC: 0 % (ref 0.0–0.2)

## 2020-08-04 LAB — IRON AND TIBC
Iron: 77 ug/dL (ref 41–142)
Saturation Ratios: 22 % (ref 21–57)
TIBC: 354 ug/dL (ref 236–444)
UIBC: 277 ug/dL (ref 120–384)

## 2020-08-04 LAB — FERRITIN: Ferritin: 105 ng/mL (ref 11–307)

## 2020-08-04 NOTE — Progress Notes (Signed)
[place] [phone]   [phone]  OFFICE PROGRESS NOTE  [name], [address] [place] [address]  DIAGNOSIS: Microcytic anemia secondary to iron deficiency.  PRIOR THERAPY: Iron infusion with Venofer 300 Mg IV x3 doses.  Last dose was given 07/09/2020.  CURRENT THERAPY: Over-the-counter oral iron tablet 1 tablet p.o. daily.  INTERVAL HISTORY: Barbara Maxwell 85 y.o. female returns to the clinic today for follow-up visit.  The patient is feeling fine today with no concerning complaints.  She tolerated the previous iron infusion with Venofer fairly well.  The patient also continues on over-the-counter oral iron tablet 1 tablet p.o. daily and tolerating it well.  She was seen recently by Dr. Ardis Hughs and expected to have EGD and colonoscopy in July 2022.  She felt much better after the iron infusion.  She denied having any fatigue or weakness.  She has no nausea, vomiting, diarrhea or constipation.  She has no chest pain, shortness of breath, cough or hemoptysis.  She is here today for evaluation with repeat CBC, iron study, ferritin as well as vitamin B12 level.  MEDICAL HISTORY: Past Medical History:  Diagnosis Date   Anemia    B12 deficiency    Cancer ([place])    [facility]-  MELANOMA REMOVED FROM BACK    Coronary artery disease    Depression    Hypercholesterolemia    Hypertension     ALLERGIES:  is allergic to bee venom, simvastatin, codeine, evista [raloxifene], and percocet [oxycodone-acetaminophen].  MEDICATIONS:  Current Outpatient Medications  Medication Sig Dispense Refill   acetaminophen (TYLENOL) 325 MG tablet Take 325 mg by mouth every 6 (six) hours as needed for moderate pain.      amLODipine (NORVASC) 2.5 MG tablet Take 1 tablet (2.5 mg total) by mouth daily. 90 tablet 3   aspirin EC 81 MG tablet Take 1 tablet (81 mg total) by mouth daily. Swallow whole. 90 tablet 3   Calcium  Carbonate-Vitamin D (CALCIUM + D PO) Take 1 tablet by mouth daily.     Carboxymethylcellulose Sodium (REFRESH TEARS OP) Place 1 drop into both eyes daily.     clopidogrel (PLAVIX) 75 MG tablet Take 1 tablet (75 mg total) by mouth daily with breakfast. 90 tablet 2   ezetimibe (ZETIA) 10 MG tablet TAKE 1 TABLET DAILY 90 tablet 3   ferrous sulfate 325 (65 FE) MG EC tablet Take 325 mg by mouth daily with breakfast.     iron polysaccharides (NIFEREX) 150 MG capsule Take 1 capsule (150 mg total) by mouth daily. 30 capsule 1   metoprolol succinate (TOPROL-XL) 25 MG 24 hr tablet Take 1 tablet (25 mg total) by mouth daily. 90 tablet 2   neomycin-polymyxin-hydrocortisone (CORTISPORIN) OTIC solution Place 3 drops into the left ear 3 (three) times daily. 10 mL 0   nitroGLYCERIN (NITROSTAT) 0.4 MG SL tablet Place 1 tablet (0.4 mg total) under the tongue every 5 (five) minutes as needed for chest pain. 25 tablet 3   pantoprazole (PROTONIX) 40 MG tablet TAKE 1 TABLET(40 MG) BY MOUTH TWICE DAILY BEFORE A MEAL 60 tablet 1   rosuvastatin (CRESTOR) 40 MG tablet TAKE 1 TABLET DAILY 90 tablet 3   sertraline (ZOLOFT) 50 MG tablet Take 1 tablet (50 mg total) by mouth daily. 90 tablet 3   sodium chloride (OCEAN) 0.65 % SOLN nasal spray Place 1 spray into both nostrils daily.  No current facility-administered medications for this visit.    SURGICAL HISTORY:  Past Surgical History:  Procedure Laterality Date   ABDOMINAL HYSTERECTOMY     VAGINAL HYSTERECTOMY WITH OVARIAN PRESERVATION    ABDOMINOPLASTY  1998   APPENDECTOMY     BREAST SURGERY  1985   BREAST REDUCTION    CARDIAC CATHETERIZATION  11/19/2019   CORONARY BALLOON ANGIOPLASTY N/A 11/19/2019   Procedure: CORONARY BALLOON ANGIOPLASTY;  Surgeon: Lorretta Harp, MD;  Location: [facility];  Service: Cardiovascular;  Laterality: N/A;   LEFT HEART CATH AND CORONARY ANGIOGRAPHY N/A 11/19/2019   Procedure: LEFT HEART CATH AND CORONARY ANGIOGRAPHY;   Surgeon: Lorretta Harp, MD;  Location: [facility];  Service: Cardiovascular;  Laterality: N/A;   TONSILLECTOMY AND ADENOIDECTOMY      REVIEW OF SYSTEMS:  A comprehensive review of systems was negative.   PHYSICAL EXAMINATION: General appearance: alert, cooperative, and no distress Head: Normocephalic, without obvious abnormality, atraumatic Neck: no adenopathy, no JVD, supple, symmetrical, trachea midline, and thyroid not enlarged, symmetric, no tenderness/mass/nodules Lymph nodes: Cervical, supraclavicular, and axillary nodes normal. Resp: clear to auscultation bilaterally Back: symmetric, no curvature. ROM normal. No CVA tenderness. Cardio: regular rate and rhythm, S1, S2 normal, no murmur, click, rub or gallop GI: soft, non-tender; bowel sounds normal; no masses,  no organomegaly Extremities: extremities normal, atraumatic, no cyanosis or edema  ECOG PERFORMANCE STATUS: 1 - Symptomatic but completely ambulatory  Blood pressure (!) 118/59, pulse 64, temperature 98.4 F (36.9 C), temperature source Oral, resp. rate 18, height 5\' 3"  (1.6 m), weight 141 lb 11.2 oz (64.3 kg), SpO2 99 %.  LABORATORY DATA: Lab Results  Component Value Date   WBC 5.1 08/04/2020   HGB 12.3 08/04/2020   HCT 38.6 08/04/2020   MCV 81.8 08/04/2020   PLT 237 08/04/2020      Chemistry      Component Value Date/Time   NA 143 06/02/2020 1251   NA 141 11/16/2019 1151   K 3.7 06/02/2020 1251   CL 110 06/02/2020 1251   CO2 22 06/02/2020 1251   BUN 15 06/02/2020 1251   BUN 13 11/16/2019 1151   CREATININE 1.01 (H) 06/02/2020 1251   CREATININE 0.77 09/07/2019 0956      Component Value Date/Time   CALCIUM 9.1 06/02/2020 1251   ALKPHOS 53 06/02/2020 1251   AST 21 06/02/2020 1251   ALT 12 06/02/2020 1251   BILITOT 0.8 06/02/2020 1251       RADIOGRAPHIC STUDIES: No results found.  ASSESSMENT AND PLAN: This is a very pleasant 85 years old white female with microcytic anemia secondary to  iron deficiency likely from gastrointestinal blood loss.  The patient also has mild vitamin B12 deficiency. She was treated with iron infusion with Venofer 300 mg IV weekly for 3 weeks and tolerated it fairly well. The patient is currently on over-the-counter iron tablet 1 tablet p.o. daily and tolerating it well. Repeat CBC today showed significant improvement in her hemoglobin and hematocrit and hemoglobin is in the normal range of 12.3. I recommended for the patient to continue with the oral iron tablet for now. She is scheduled for EGD and colonoscopy next months. I will see her back for follow-up visit in 3 months for evaluation with repeat CBC, iron study and ferritin. The patient was advised to call immediately if she has any other concerning symptoms in the interval.  The patient voices understanding of current disease status and treatment options and is  in agreement with the current care plan.  All questions were answered. The patient knows to call the clinic with any problems, questions or concerns. We can certainly see the patient much sooner if necessary.  The total time spent in the appointment was 20 minutes.  Disclaimer: This note was dictated with voice recognition software. Similar sounding words can inadvertently be transcribed and may not be corrected upon review.

## 2020-08-06 ENCOUNTER — Inpatient Hospital Stay: Payer: Medicare Other

## 2020-08-06 ENCOUNTER — Other Ambulatory Visit: Payer: Self-pay

## 2020-08-06 ENCOUNTER — Encounter: Payer: Self-pay | Admitting: Physician Assistant

## 2020-08-06 DIAGNOSIS — D509 Iron deficiency anemia, unspecified: Secondary | ICD-10-CM

## 2020-08-06 DIAGNOSIS — I2510 Atherosclerotic heart disease of native coronary artery without angina pectoris: Secondary | ICD-10-CM | POA: Diagnosis not present

## 2020-08-06 DIAGNOSIS — D500 Iron deficiency anemia secondary to blood loss (chronic): Secondary | ICD-10-CM | POA: Diagnosis not present

## 2020-08-06 DIAGNOSIS — K922 Gastrointestinal hemorrhage, unspecified: Secondary | ICD-10-CM | POA: Diagnosis not present

## 2020-08-06 DIAGNOSIS — Z79899 Other long term (current) drug therapy: Secondary | ICD-10-CM | POA: Diagnosis not present

## 2020-08-06 MED ORDER — CYANOCOBALAMIN 1000 MCG/ML IJ SOLN
1000.0000 ug | Freq: Once | INTRAMUSCULAR | Status: AC
  Administered 2020-08-06: 1000 ug via INTRAMUSCULAR

## 2020-08-06 MED ORDER — CYANOCOBALAMIN 1000 MCG/ML IJ SOLN
INTRAMUSCULAR | Status: AC
  Filled 2020-08-06: qty 1

## 2020-08-08 ENCOUNTER — Telehealth: Payer: Self-pay | Admitting: Internal Medicine

## 2020-08-08 NOTE — Telephone Encounter (Signed)
Scheduled per los. Called , not able to leave a msg. Mailed printout

## 2020-08-18 ENCOUNTER — Encounter: Payer: Self-pay | Admitting: Physician Assistant

## 2020-08-19 ENCOUNTER — Other Ambulatory Visit: Payer: Self-pay | Admitting: Cardiovascular Disease

## 2020-08-26 NOTE — Progress Notes (Signed)
Cardiology Clinic Note   Patient Name: Barbara Maxwell Date of Encounter: 08/29/2020  Primary Care Provider:  [name], [place] Primary Cardiologist:  Quay Burow, MD  Patient Profile    Barbara Maxwell 85 year old female presents to clinic today for follow-up evaluation of her essential hypertension and coronary artery disease.  Past Medical History    Past Medical History:  Diagnosis Date   Anemia    B12 deficiency    Cancer ([place])    [facility]-  MELANOMA REMOVED FROM BACK    Coronary artery disease    Depression    Hypercholesterolemia    Hypertension    Past Surgical History:  Procedure Laterality Date   ABDOMINAL HYSTERECTOMY     VAGINAL HYSTERECTOMY WITH OVARIAN PRESERVATION    ABDOMINOPLASTY  1998   APPENDECTOMY     BREAST SURGERY  1985   BREAST REDUCTION    CARDIAC CATHETERIZATION  11/19/2019   CORONARY BALLOON ANGIOPLASTY N/A 11/19/2019   Procedure: CORONARY BALLOON ANGIOPLASTY;  Surgeon: Lorretta Harp, MD;  Location: [facility];  Service: Cardiovascular;  Laterality: N/A;   LEFT HEART CATH AND CORONARY ANGIOGRAPHY N/A 11/19/2019   Procedure: LEFT HEART CATH AND CORONARY ANGIOGRAPHY;  Surgeon: Lorretta Harp, MD;  Location: [facility];  Service: Cardiovascular;  Laterality: N/A;   TONSILLECTOMY AND ADENOIDECTOMY      Allergies  Allergies  Allergen Reactions   Bee Venom Anaphylaxis   Simvastatin     Myopathy   Codeine Nausea Only   Evista [Raloxifene]     Hot flashes   Percocet [Oxycodone-Acetaminophen] Nausea And Vomiting    History of Present Illness    Barbara Maxwell has a PMH of essential hypertension, mixed hyperlipidemia, and coronary artery disease.  Her husband of 80 years recently passed away.  She has 3 living children and 6 grandchildren.  She was referred by Dr. Valere Dross for cardiac evaluation due to atypical chest pain and dyspnea.  Her father had bypass surgery and she has a son who had a stent placed.  She  has been fairly active and does water aerobics 2 days/week.  She does notice some dyspnea walking up inclines and notices some atypical chest pain.  She reports some anxiety with living alone.  Her echocardiogram 10/15/2019 showed G1 DD.  She had a coronary calcium score of 1681 which suggested proximal to mid RCA disease and LAD disease.  She underwent outpatient diagnostic coronary angiography 11/19/2019 which showed 60% proximal LAD and 99% ostial RCA with grade 2-3 left to right collaterals.  A temporary transvenous pacemaker was placed and orbital arthrectomy PCI and stenting were attempted.  However the lesion was unable to be crossed.  The procedure was aborted and medical management was recommended.  She was last seen by Dr. Gwenlyn Found on 05/30/2020.  During that time she reported that she had been to the emergency department after left arm pain.  She was taking sublingual nitroglycerin and became presyncopal.  She was evaluated and found to be anemic with a hemoglobin of 7.7.  Reasons were unclear.  She received 1 unit of PRBCs which increased her hemoglobin to 9.1.  She was placed on oral iron supplementation.  She was scheduled for colonoscopy.  Her dyspnea with walking up inclines had reduced.  She presents the clinic today for follow-up evaluation states she feels well.  She recently underwent colonoscopy.  She was found to have some esophageal irritation, hiatal hernia and is being evaluated  for H. pylori.  She also had colonoscopy which showed a polyp and no active bleed.  GI felt that the bleed was related to her esophageal irritation.  She has been receiving iron infusions from the infusion center as well as B12 injections.  She reports that she is able to walk down to her mailbox and back without increasing shortness of breath.  She has changed her diet and is eating more iron rich foods.  She presents with her daughter who feels she is doing much better.  Her daughter is a retired Counsellor.  I will  have her continue her current medications, maintain her physical activity and follow-up in 6 months.  We will repeat fasting lipids and LFTs in September.  Today she denies chest pain, shortness of breath, lower extremity edema, fatigue, palpitations, melena, hematuria, hemoptysis, diaphoresis, weakness, presyncope, syncope, orthopnea, and PND.   Home Medications    Prior to Admission medications   Medication Sig Start Date End Date Taking? Authorizing Provider  acetaminophen (TYLENOL) 325 MG tablet Take 325 mg by mouth every 6 (six) hours as needed for moderate pain.     [provider]  amLODipine (NORVASC) 2.5 MG tablet Take 1 tablet (2.5 mg total) by mouth daily. 04/09/20 07/08/20  Lorretta Harp, MD  aspirin EC 81 MG tablet Take 1 tablet (81 mg total) by mouth daily. Swallow whole. 11/20/19 11/19/20  Cheryln Manly, NP  Calcium Carbonate-Vitamin D (CALCIUM + D PO) Take 1 tablet by mouth daily.    [provider]  Carboxymethylcellulose Sodium (REFRESH TEARS OP) Place 1 drop into both eyes daily.    [provider]  clopidogrel (PLAVIX) 75 MG tablet TAKE 1 TABLET DAILY WITH BREAKFAST 08/19/20   Lorretta Harp, MD  ezetimibe (ZETIA) 10 MG tablet TAKE 1 TABLET DAILY 04/08/20   Elayne Snare, MD  ferrous sulfate 325 (65 FE) MG EC tablet Take 325 mg by mouth daily with breakfast.    [provider]  iron polysaccharides (NIFEREX) 150 MG capsule Take 1 capsule (150 mg total) by mouth daily. 05/28/20   [name], FNP  metoprolol succinate (TOPROL-XL) 25 MG 24 hr tablet TAKE 1 TABLET DAILY 08/19/20   Lorretta Harp, MD  neomycin-polymyxin-hydrocortisone (CORTISPORIN) OTIC solution Place 3 drops into the left ear 3 (three) times daily. 04/14/20   [name], FNP  nitroGLYCERIN (NITROSTAT) 0.4 MG SL tablet Place 1 tablet (0.4 mg total) under the tongue every 5 (five) minutes as needed for chest pain. 11/20/19 11/19/20  Cheryln Manly, NP   pantoprazole (PROTONIX) 40 MG tablet TAKE 1 TABLET(40 MG) BY MOUTH TWICE DAILY BEFORE A MEAL 07/23/20   [name], FNP  rosuvastatin (CRESTOR) 40 MG tablet TAKE 1 TABLET DAILY 04/08/20   Elayne Snare, MD  sertraline (ZOLOFT) 50 MG tablet Take 1 tablet (50 mg total) by mouth daily. 02/13/20   [name], FNP  sodium chloride (OCEAN) 0.65 % SOLN nasal spray Place 1 spray into both nostrils daily.    [provider]    Family History    Family History  Problem Relation Age of Onset   Hypertension Mother    Heart disease Father    Diabetes Father    Cancer Sister 73       OVARIAN    Thyroid disease Sister    Heart disease Brother    Diabetes Paternal Grandmother    Thyroid disease Daughter    Colon cancer Neg  Hx    Esophageal cancer Neg Hx    Rectal cancer Neg Hx    Stomach cancer Neg Hx    She indicated that her mother is deceased. She indicated that her father is deceased. She indicated that the status of her sister is unknown. She indicated that the status of her brother is unknown. She indicated that the status of her paternal grandmother is unknown. She indicated that her daughter is deceased. She indicated that the status of her neg hx is unknown.  Social History    Social History   Socioeconomic History   Marital status: Widowed    Spouse name: Not on file   Number of children: 4   Years of education: Not on file   Highest education level: Not on file  Occupational History   Occupation: retired  Tobacco Use   Smoking status: Never   Smokeless tobacco: Never  Vaping Use   Vaping Use: Never used  Substance and Sexual Activity   Alcohol use: No    Alcohol/week: 0.0 standard drinks   Drug use: Not on file   Sexual activity: Not Currently    Comment: 1st intercourse- 20, partners- 2, widow  Other Topics Concern   Not on file  Social History Narrative   Not on file   Social Determinants of Health   Financial Resource Strain: Not on  file  Food Insecurity: Not on file  Transportation Needs: Not on file  Physical Activity: Not on file  Stress: Not on file  Social Connections: Not on file  Intimate Partner Violence: Not on file     Review of Systems    General:  No chills, fever, night sweats or weight changes.  Cardiovascular:  No chest pain, dyspnea on exertion, edema, orthopnea, palpitations, paroxysmal nocturnal dyspnea. Dermatological: No rash, lesions/masses Respiratory: No cough, dyspnea Urologic: No hematuria, dysuria Abdominal:   No nausea, vomiting, diarrhea, bright red blood per rectum, melena, or hematemesis Neurologic:  No visual changes, wkns, changes in mental status. All other systems reviewed and are otherwise negative except as noted above.  Physical Exam    VS:  BP 112/68 (BP Location: Left Arm, Patient Position: Sitting, Cuff Size: Normal)   Pulse 64   Ht 5\' 3"  (1.6 m)   Wt 144 lb 3.2 oz (65.4 kg)   SpO2 97%   BMI 25.54 kg/m  , BMI Body mass index is 25.54 kg/m. GEN: Well nourished, well developed, in no acute distress. HEENT: normal. Neck: Supple, no JVD, carotid bruits, or masses. Cardiac: RRR, no murmurs, rubs, or gallops. No clubbing, cyanosis, edema.  Radials/DP/PT 2+ and equal bilaterally.  Respiratory:  Respirations regular and unlabored, clear to auscultation bilaterally. GI: Soft, nontender, nondistended, BS + x 4. MS: no deformity or atrophy. Skin: warm and dry, no rash. Neuro:  Strength and sensation are intact. Psych: Normal affect.  Accessory Clinical Findings    Recent Labs: 06/02/2020: ALT 12; BUN 15; Creatinine 1.01; Potassium 3.7; Sodium 143 08/04/2020: Hemoglobin 12.3; Platelet Count 237   Recent Lipid Panel    Component Value Date/Time   CHOL 119 11/20/2019 0828   TRIG 117 11/20/2019 0828   HDL 45 11/20/2019 0828   CHOLHDL 2.6 11/20/2019 0828   VLDL 23 11/20/2019 0828   LDLCALC 51 11/20/2019 0828    ECG personally reviewed by me today-none  today.  Echocardiogram 10/15/2019 IMPRESSIONS     1. Left ventricular ejection fraction, by estimation, is 55 to 60%. The  left ventricle has normal  function. The left ventricle has no regional  wall motion abnormalities. There is mild left ventricular hypertrophy.  Left ventricular diastolic parameters  are consistent with Grade I diastolic dysfunction (impaired relaxation).   2. Right ventricular systolic function is normal. The right ventricular  size is normal. There is normal pulmonary artery systolic pressure. The  estimated right ventricular systolic pressure is 30.0 mmHg.   3. Left atrial size was mildly dilated.   4. The mitral valve is normal in structure. Trivial mitral valve  regurgitation. No evidence of mitral stenosis.   5. The aortic valve is tricuspid. Aortic valve regurgitation is not  visualized. No aortic stenosis is present.   6. The inferior vena cava is normal in size with greater than 50%  respiratory variability, suggesting right atrial pressure of 3 mmHg.   Cardiac catheterization 11/19/2019 Ost RCA to Prox RCA lesion is 99% stenosed. Prox LAD to Mid LAD lesion is 60% stenosed.   Barbara Maxwell is a 85 y.o. female      [id number] LOCATION:  FACILITY: [facility] PHYSICIAN: Quay Burow, M.D. Jan 30, 1936  Diagnostic Dominance: Right    Intervention   Assessment & Plan   1.  Essential hypertension-BP today 112/68.  Well-controlled at home. Continue amlodipine, metoprolol Heart healthy low-sodium diet-salty 6 given Increase physical activity as tolerated  Coronary artery disease-denies chest pain today.  No recent episodes of arm neck back or chest pain.  Underwent cardiac catheterization 11/19/2019 and was noted to have moderate proximal LAD disease, and occluded calcified ostial RCA stenosis with high-grade 2-3 left to right collaterals.  Arthrectomy was attempted but lesion was not will be crossed.  Medical management was recommended. Continue  amlodipine, metoprolol, rosuvastatin, Zetia, aspirin, nitroglycerin Heart healthy low-sodium diet-salty 6 given Increase physical activity as tolerated  Hyperlipidemia-11/20/2019: Cholesterol 119; HDL 45; LDL Cholesterol 51; Triglycerides 117; VLDL 23 Continue rosuvastatin, ezetimibe Heart healthy low-sodium high-fiber diet.   Increase physical activity as tolerated Repeat fasting lipids and LFTs after 9/22  Disposition: Follow-up with Dr. Gwenlyn Found in 6 months.  Jossie Ng. Lawanda Holzheimer NP-C    08/29/2020, 12:09 PM [place] [address] Office [phone] Fax [phone]  Notice: This dictation was prepared with Dragon dictation along with smaller phrase technology. Any transcriptional errors that result from this process are unintentional and may not be corrected upon review.  I spent 15 minutes examining this patient, reviewing medications, and using patient centered shared decision making involving her cardiac care.  Prior to her visit I spent greater than 20 minutes reviewing her past medical history,  medications, and prior cardiac tests.

## 2020-08-27 ENCOUNTER — Encounter: Payer: Self-pay | Admitting: Gastroenterology

## 2020-08-27 ENCOUNTER — Other Ambulatory Visit: Payer: Self-pay

## 2020-08-27 ENCOUNTER — Ambulatory Visit (AMBULATORY_SURGERY_CENTER): Payer: Medicare Other | Admitting: Gastroenterology

## 2020-08-27 VITALS — BP 120/60 | HR 61 | Temp 96.2°F | Resp 13 | Ht 63.0 in | Wt 145.0 lb

## 2020-08-27 DIAGNOSIS — K2970 Gastritis, unspecified, without bleeding: Secondary | ICD-10-CM

## 2020-08-27 DIAGNOSIS — K5730 Diverticulosis of large intestine without perforation or abscess without bleeding: Secondary | ICD-10-CM

## 2020-08-27 DIAGNOSIS — K449 Diaphragmatic hernia without obstruction or gangrene: Secondary | ICD-10-CM

## 2020-08-27 DIAGNOSIS — D649 Anemia, unspecified: Secondary | ICD-10-CM | POA: Diagnosis not present

## 2020-08-27 DIAGNOSIS — Z7902 Long term (current) use of antithrombotics/antiplatelets: Secondary | ICD-10-CM | POA: Diagnosis not present

## 2020-08-27 DIAGNOSIS — D509 Iron deficiency anemia, unspecified: Secondary | ICD-10-CM | POA: Diagnosis not present

## 2020-08-27 DIAGNOSIS — D123 Benign neoplasm of transverse colon: Secondary | ICD-10-CM

## 2020-08-27 DIAGNOSIS — K2960 Other gastritis without bleeding: Secondary | ICD-10-CM | POA: Diagnosis not present

## 2020-08-27 DIAGNOSIS — B9681 Helicobacter pylori [H. pylori] as the cause of diseases classified elsewhere: Secondary | ICD-10-CM | POA: Diagnosis not present

## 2020-08-27 DIAGNOSIS — K219 Gastro-esophageal reflux disease without esophagitis: Secondary | ICD-10-CM | POA: Diagnosis not present

## 2020-08-27 MED ORDER — SODIUM CHLORIDE 0.9 % IV SOLN
500.0000 mL | Freq: Once | INTRAVENOUS | Status: DC
Start: 2020-08-27 — End: 2020-08-27

## 2020-08-27 NOTE — Progress Notes (Signed)
A and O x3. Report to RN. Tolerated MAC anesthesia well.Teeth unchanged after procedure. 

## 2020-08-27 NOTE — Progress Notes (Signed)
VS taken by VV

## 2020-08-27 NOTE — Op Note (Addendum)
[facility] Patient Name: Barbara Maxwell Procedure Date: 08/27/2020 1:50 PM MRN: [id number] Endoscopist: Milus Banister , MD Age: 85 Referring MD:  Date of Birth: 03/15/1935 Gender: Female Account #: [id number] Procedure:                Upper GI endoscopy Indications:              Iron deficiency anemia, hemocult negative stool Medicines:                Monitored Anesthesia Care Procedure:                Pre-Anesthesia Assessment:                           - Prior to the procedure, a History and Physical                            was performed, and patient medications and                            allergies were reviewed. The patient's tolerance of                            previous anesthesia was also reviewed. The risks                            and benefits of the procedure and the sedation                            options and risks were discussed with the patient.                            All questions were answered, and informed consent                            was obtained. Prior Anticoagulants: The patient has                            taken Plavix (clopidogrel), last dose was 5 days                            prior to procedure. ASA Grade Assessment: II - A                            patient with mild systemic disease. After reviewing                            the risks and benefits, the patient was deemed in                            satisfactory condition to undergo the procedure.                           After obtaining informed consent, the endoscope was  passed under direct vision. Throughout the                            procedure, the patient's blood pressure, pulse, and                            oxygen saturations were monitored continuously. The                            GIF [id number] #[id number] was introduced through the                            mouth, and advanced to the second part of duodenum.                             The upper GI endoscopy was accomplished without                            difficulty. The patient tolerated the procedure                            well. Scope In: Scope Out: Findings:                 Normal esophagus.                           Medium sized hiatal hernia with several classic                            appearing Cameron's type erosions.                           Moderate inflammation characterized by erythema,                            friability and granularity was found in the gastric                            antrum. Biopsies were taken with a cold forceps for                            histology. jar 2                           Norma duodenum was biopsied. Jar 1.                           The exam was otherwise without abnormality. Complications:            No immediate complications. Estimated blood loss:                            None. Estimated Blood Loss:     Estimated blood loss: none. Impression:               - Hiatal hernia with classic appearing  Cameron's                            type erosions. Possibly the source of her anemia.                           - Moderate, non-specific gastritis was biospied to                            check for H. pylori.                           - Normal duodenum was biopsied to check for celiac                            sprue                           - The examination was otherwise normal. Recommendation:           - Patient has a contact number available for                            emergencies. The signs and symptoms of potential                            delayed complications were discussed with the                            patient. Return to normal activities tomorrow.                            Written discharge instructions were provided to the                            patient.                           - Resume previous diet.                           - Continue present medications. Continue once daily                             iron supplement.                           - Await pathology results.                           - Dr. Ardis Hughs office will arrange CBC in 3 months.                           - It is safe to resume your plavix today. Milus Banister, MD 08/27/2020 2:26:04 PM This report has been signed electronically.

## 2020-08-27 NOTE — Progress Notes (Signed)
Called to room to assist during endoscopic procedure.  Patient ID and intended procedure confirmed with present staff. Received instructions for my participation in the procedure from the performing physician.  

## 2020-08-27 NOTE — Patient Instructions (Signed)
Discharge instructions given. Handouts on polyps and diverticulosis. Resume previous medications. Resume Plavix today. YOU HAD AN ENDOSCOPIC PROCEDURE TODAY AT [facility]:   Refer to the procedure report that was given to you for any specific questions about what was found during the examination.  If the procedure report does not answer your questions, please call your gastroenterologist to clarify.  If you requested that your care partner not be given the details of your procedure findings, then the procedure report has been included in a sealed envelope for you to review at your convenience later.  YOU SHOULD EXPECT: Some feelings of bloating in the abdomen. Passage of more gas than usual.  Walking can help get rid of the air that was put into your GI tract during the procedure and reduce the bloating. If you had a lower endoscopy (such as a colonoscopy or flexible sigmoidoscopy) you may notice spotting of blood in your stool or on the toilet paper. If you underwent a bowel prep for your procedure, you may not have a normal bowel movement for a few days.  Please Note:  You might notice some irritation and congestion in your nose or some drainage.  This is from the oxygen used during your procedure.  There is no need for concern and it should clear up in a day or so.  SYMPTOMS TO REPORT IMMEDIATELY:  Following lower endoscopy (colonoscopy or flexible sigmoidoscopy):  Excessive amounts of blood in the stool  Significant tenderness or worsening of abdominal pains  Swelling of the abdomen that is new, acute  Fever of 100F or higher  Following upper endoscopy (EGD)  Vomiting of blood or coffee ground material  New chest pain or pain under the shoulder blades  Painful or persistently difficult swallowing  New shortness of breath  Fever of 100F or higher  Black, tarry-looking stools  For urgent or emergent issues, a gastroenterologist can be reached at any hour by calling  [phone]. Do not use MyChart messaging for urgent concerns.    DIET:  We do recommend a small meal at first, but then you may proceed to your regular diet.  Drink plenty of fluids but you should avoid alcoholic beverages for 24 hours.  ACTIVITY:  You should plan to take it easy for the rest of today and you should NOT DRIVE or use heavy machinery until tomorrow (because of the sedation medicines used during the test).    FOLLOW UP: Our staff will call the number listed on your records 48-72 hours following your procedure to check on you and address any questions or concerns that you may have regarding the information given to you following your procedure. If we do not reach you, we will leave a message.  We will attempt to reach you two times.  During this call, we will ask if you have developed any symptoms of COVID 19. If you develop any symptoms (ie: fever, flu-like symptoms, shortness of breath, cough etc.) before then, please call [phone].  If you test positive for Covid 19 in the 2 weeks post procedure, please call and report this information to [place].    If any biopsies were taken you will be contacted by phone or by letter within the next 1-3 weeks.  Please call us at [phone] if you have not heard about the biopsies in 3 weeks.    SIGNATURES/CONFIDENTIALITY: You and/or your care partner have signed paperwork which will be entered into your electronic medical record.  These signatures attest to the fact that that the information above on your After Visit Summary has been reviewed and is understood.  Full responsibility of the confidentiality of this discharge information lies with you and/or your care-partner.  

## 2020-08-27 NOTE — Op Note (Signed)
[facility] Patient Name: Barbara Maxwell Procedure Date: 08/27/2020 1:50 PM MRN: [id number] Endoscopist: Milus Banister , MD Age: 85 Referring MD:  Date of Birth: 1935-11-20 Gender: Female Account #: [id number] Procedure:                Colonoscopy Indications:              Iron deficiency anemia, heme negative Medicines:                Monitored Anesthesia Care Procedure:                Pre-Anesthesia Assessment:                           - Prior to the procedure, a History and Physical                            was performed, and patient medications and                            allergies were reviewed. The patient's tolerance of                            previous anesthesia was also reviewed. The risks                            and benefits of the procedure and the sedation                            options and risks were discussed with the patient.                            All questions were answered, and informed consent                            was obtained. Prior Anticoagulants: The patient has                            taken Plavix (clopidogrel), last dose was 5 days                            prior to procedure. ASA Grade Assessment: III - A                            patient with severe systemic disease. After                            reviewing the risks and benefits, the patient was                            deemed in satisfactory condition to undergo the                            procedure.  After obtaining informed consent, the colonoscope                            was passed under direct vision. Throughout the                            procedure, the patient's blood pressure, pulse, and                            oxygen saturations were monitored continuously. The                            Olympus [id number]-[id number] (#[id number]) Colonoscope was                            introduced through the anus and advanced to the the                             cecum, identified by appendiceal orifice and                            ileocecal valve. The colonoscopy was performed                            without difficulty. The patient tolerated the                            procedure well. The quality of the bowel                            preparation was good. The ileocecal valve,                            appendiceal orifice, and rectum were photographed. Scope In: 1:59:45 PM Scope Out: 2:12:18 PM Scope Withdrawal Time: 0 hours 8 minutes 37 seconds  Total Procedure Duration: 0 hours 12 minutes 33 seconds  Findings:                 A 2 mm polyp was found in the transverse colon. The                            polyp was sessile. The polyp was removed with a                            cold snare. Resection and retrieval were complete.                           Multiple small and large-mouthed diverticula were                            found in the left colon.                           The exam was otherwise without abnormality on  direct and retroflexion views. Complications:            No immediate complications. Estimated blood loss:                            None. Estimated Blood Loss:     Estimated blood loss: none. Impression:               - One 2 mm polyp in the transverse colon, removed                            with a cold snare. Resected and retrieved.                           - Diverticulosis in the left colon.                           - The examination was otherwise normal on direct                            and retroflexion views. Recommendation:           - Await pathology results.                           - EGD now. Milus Banister, MD 08/27/2020 2:14:43 PM This report has been signed electronically.

## 2020-08-28 ENCOUNTER — Other Ambulatory Visit: Payer: Self-pay

## 2020-08-28 DIAGNOSIS — D509 Iron deficiency anemia, unspecified: Secondary | ICD-10-CM

## 2020-08-29 ENCOUNTER — Encounter: Payer: Self-pay | Admitting: General Practice

## 2020-08-29 ENCOUNTER — Ambulatory Visit (INDEPENDENT_AMBULATORY_CARE_PROVIDER_SITE_OTHER): Payer: Medicare Other | Admitting: General Practice

## 2020-08-29 ENCOUNTER — Other Ambulatory Visit: Payer: Self-pay

## 2020-08-29 VITALS — BP 112/68 | HR 64 | Ht 63.0 in | Wt 144.2 lb

## 2020-08-29 DIAGNOSIS — E782 Mixed hyperlipidemia: Secondary | ICD-10-CM

## 2020-08-29 DIAGNOSIS — I25118 Atherosclerotic heart disease of native coronary artery with other forms of angina pectoris: Secondary | ICD-10-CM

## 2020-08-29 DIAGNOSIS — Z79899 Other long term (current) drug therapy: Secondary | ICD-10-CM

## 2020-08-29 DIAGNOSIS — I10 Essential (primary) hypertension: Secondary | ICD-10-CM | POA: Diagnosis not present

## 2020-08-29 NOTE — Patient Instructions (Signed)
Medication Instructions:  The current medical regimen is effective;  continue present plan and medications as directed. Please refer to the Current Medication list given to you today. *If you need a refill on your cardiac medications before your next appointment, please call your pharmacy*  Lab Work:    FASTING LIPID IN [place]      Special Instructions PLEASE READ AND FOLLOW SALTY 6-ATTACHED-1,800mg  daily  PLEASE MAINTAIN PHYSICAL ACTIVITY AS TOLERATED  Follow-Up: Your next appointment:  6 month(s) In Person with Quay Burow, MD   Please call our office 2 months in advance to schedule this appointment   At [facility], you and your health needs are our priority.  As part of our continuing mission to provide you with exceptional heart care, we have created designated Provider Care Teams.  These Care Teams include your primary Cardiologist (physician) and Advanced Practice Providers (APPs -  Physician Assistants and Nurse Practitioners) who all work together to provide you with the care you need, when you need it.  We recommend signing up for the patient portal called "MyChart".  Sign up information is provided on this After Visit Summary.  MyChart is used to connect with patients for Virtual Visits (Telemedicine).  Patients are able to view lab/test results, encounter notes, upcoming appointments, etc.  Non-urgent messages can be sent to your provider as well.   To learn more about what you can do with MyChart, go to [email or web address].              6 SALTY THINGS TO AVOID     1,800MG  DAILY

## 2020-09-03 ENCOUNTER — Inpatient Hospital Stay: Payer: Medicare Other | Attending: Physician Assistant

## 2020-09-03 ENCOUNTER — Other Ambulatory Visit: Payer: Self-pay

## 2020-09-03 DIAGNOSIS — K922 Gastrointestinal hemorrhage, unspecified: Secondary | ICD-10-CM | POA: Diagnosis not present

## 2020-09-03 DIAGNOSIS — D500 Iron deficiency anemia secondary to blood loss (chronic): Secondary | ICD-10-CM | POA: Insufficient documentation

## 2020-09-03 DIAGNOSIS — D509 Iron deficiency anemia, unspecified: Secondary | ICD-10-CM

## 2020-09-03 DIAGNOSIS — Z79899 Other long term (current) drug therapy: Secondary | ICD-10-CM | POA: Insufficient documentation

## 2020-09-03 DIAGNOSIS — E538 Deficiency of other specified B group vitamins: Secondary | ICD-10-CM | POA: Diagnosis not present

## 2020-09-03 MED ORDER — CYANOCOBALAMIN 1000 MCG/ML IJ SOLN
1000.0000 ug | Freq: Once | INTRAMUSCULAR | Status: AC
  Administered 2020-09-03: 1000 ug via INTRAMUSCULAR

## 2020-09-03 MED ORDER — CYANOCOBALAMIN 1000 MCG/ML IJ SOLN
INTRAMUSCULAR | Status: AC
  Filled 2020-09-03: qty 1

## 2020-09-03 NOTE — Patient Instructions (Signed)
Vitamin B12 Injection What is this medication? Vitamin B12 (VAHY tuh min B12) prevents and treats low vitamin B12 levels in your body. It is used in people who do not get enough vitamin B12 from their diet or when their digestive tract does not absorb enough. Vitamin B12 plays an important role in maintaining the health of your nervous system and red bloodcells. This medicine may be used for other purposes; ask your health care provider orpharmacist if you have questions. COMMON BRAND NAME(S): B-12 Compliance Kit, B-12 Injection Kit, Cyomin, LA-12,Nutri-Twelve, Physicians EZ Use B-12, Primabalt What should I tell my care team before I take this medication? They need to know if you have any of these conditions: Kidney disease Leber's disease Megaloblastic anemia An unusual or allergic reaction to cyanocobalamin, cobalt, other medications, foods, dyes, or preservatives Pregnant or trying to get pregnant Breast-feeding How should I use this medication? This medication is injected into a muscle or deeply under the skin. It is usually given in a clinic or care team's office. However, your care team mayteach you how to inject yourself. Follow all instructions. Talk to your care team about the use of this medication in children. Specialcare may be needed. Overdosage: If you think you have taken too much of this medicine contact apoison control center or emergency room at once. NOTE: This medicine is only for you. Do not share this medicine with others. What if I miss a dose? If you are given your dose at a clinic or care team's office, call to reschedule your appointment. If you give your own injections, and you miss a dose, take it as soon as you can. If it is almost time for your next dose, takeonly that dose. Do not take double or extra doses. What may interact with this medication? Colchicine Heavy alcohol intake This list may not describe all possible interactions. Give your health care provider  a list of all the medicines, herbs, non-prescription drugs, or dietary supplements you use. Also tell them if you smoke, drink alcohol, or use illegaldrugs. Some items may interact with your medicine. What should I watch for while using this medication? Visit your care team regularly. You may need blood work done while you aretaking this medication. You may need to follow a special diet. Talk to your care team. Limit youralcohol intake and avoid smoking to get the best benefit. What side effects may I notice from receiving this medication? Side effects that you should report to your care team as soon as possible: Allergic reactions-skin rash, itching, hives, swelling of the face, lips, tongue, or throat Swelling of the ankles, hands, or feet Trouble breathing Side effects that usually do not require medical attention (report to your careteam if they continue or are bothersome): Diarrhea This list may not describe all possible side effects. Call your doctor for medical advice about side effects. You may report side effects to FDA at1-800-FDA-1088. Where should I keep my medication? Keep out of the reach of children. Store at room temperature between 15 and 30 degrees C (59 and 85 degrees F).Protect from light. Throw away any unused medication after the expiration date. NOTE: This sheet is a summary. It may not cover all possible information. If you have questions about this medicine, talk to your doctor, pharmacist, orhealth care provider.  2022 Elsevier/Gold Standard (2020-03-31 11:47:06)  

## 2020-09-05 ENCOUNTER — Encounter: Payer: Self-pay | Admitting: Gastroenterology

## 2020-09-05 ENCOUNTER — Other Ambulatory Visit: Payer: Self-pay

## 2020-09-05 MED ORDER — PYLERA 140-125-125 MG PO CAPS: 3.0000 | ORAL_CAPSULE | Freq: Three times a day (TID) | ORAL | 0 refills | Status: DC

## 2020-09-09 ENCOUNTER — Telehealth: Payer: Self-pay | Admitting: Gastroenterology

## 2020-09-09 NOTE — Telephone Encounter (Signed)
The pt questions if the prescription is correct because it is 120 pills in 10 days.  I did explain that this is the standard treatment for H pylori.  She verbalized understanding and wanted to confirm the amount was correct.  She will call if she has any further concerns or questions.

## 2020-09-09 NOTE — Telephone Encounter (Signed)
Pt calling to ask questions/concerns/regarding med [place].  Plz advise thank you

## 2020-09-17 ENCOUNTER — Other Ambulatory Visit: Payer: Self-pay | Admitting: Family

## 2020-11-04 ENCOUNTER — Other Ambulatory Visit: Payer: Self-pay

## 2020-11-04 ENCOUNTER — Inpatient Hospital Stay: Payer: Medicare Other | Attending: Physician Assistant | Admitting: Internal Medicine

## 2020-11-04 ENCOUNTER — Inpatient Hospital Stay: Payer: Medicare Other

## 2020-11-04 VITALS — BP 131/63 | HR 67 | Temp 97.9°F | Resp 19 | Ht 63.0 in | Wt 140.7 lb

## 2020-11-04 DIAGNOSIS — I10 Essential (primary) hypertension: Secondary | ICD-10-CM | POA: Diagnosis not present

## 2020-11-04 DIAGNOSIS — Z79899 Other long term (current) drug therapy: Secondary | ICD-10-CM | POA: Insufficient documentation

## 2020-11-04 DIAGNOSIS — D509 Iron deficiency anemia, unspecified: Secondary | ICD-10-CM | POA: Diagnosis not present

## 2020-11-04 DIAGNOSIS — Z9049 Acquired absence of other specified parts of digestive tract: Secondary | ICD-10-CM | POA: Insufficient documentation

## 2020-11-04 DIAGNOSIS — I2510 Atherosclerotic heart disease of native coronary artery without angina pectoris: Secondary | ICD-10-CM | POA: Insufficient documentation

## 2020-11-04 DIAGNOSIS — R0989 Other specified symptoms and signs involving the circulatory and respiratory systems: Secondary | ICD-10-CM | POA: Diagnosis not present

## 2020-11-04 DIAGNOSIS — E538 Deficiency of other specified B group vitamins: Secondary | ICD-10-CM | POA: Insufficient documentation

## 2020-11-04 DIAGNOSIS — Z91030 Bee allergy status: Secondary | ICD-10-CM | POA: Diagnosis not present

## 2020-11-04 DIAGNOSIS — Z85820 Personal history of malignant melanoma of skin: Secondary | ICD-10-CM | POA: Diagnosis not present

## 2020-11-04 DIAGNOSIS — R059 Cough, unspecified: Secondary | ICD-10-CM | POA: Insufficient documentation

## 2020-11-04 DIAGNOSIS — Z885 Allergy status to narcotic agent status: Secondary | ICD-10-CM | POA: Insufficient documentation

## 2020-11-04 DIAGNOSIS — D508 Other iron deficiency anemias: Secondary | ICD-10-CM

## 2020-11-04 DIAGNOSIS — D500 Iron deficiency anemia secondary to blood loss (chronic): Secondary | ICD-10-CM

## 2020-11-04 LAB — CBC WITH DIFFERENTIAL (CANCER CENTER ONLY)
Abs Immature Granulocytes: 0.02 10*3/uL (ref 0.00–0.07)
Basophils Absolute: 0.1 10*3/uL (ref 0.0–0.1)
Basophils Relative: 1 %
Eosinophils Absolute: 0.3 10*3/uL (ref 0.0–0.5)
Eosinophils Relative: 3 %
HCT: 37.5 % (ref 36.0–46.0)
Hemoglobin: 12.4 g/dL (ref 12.0–15.0)
Immature Granulocytes: 0 %
Lymphocytes Relative: 34 %
Lymphs Abs: 2.7 10*3/uL (ref 0.7–4.0)
MCH: 29.2 pg (ref 26.0–34.0)
MCHC: 33.1 g/dL (ref 30.0–36.0)
MCV: 88.4 fL (ref 80.0–100.0)
Monocytes Absolute: 0.6 10*3/uL (ref 0.1–1.0)
Monocytes Relative: 8 %
Neutro Abs: 4.2 10*3/uL (ref 1.7–7.7)
Neutrophils Relative %: 54 %
Platelet Count: 228 10*3/uL (ref 150–400)
RBC: 4.24 MIL/uL (ref 3.87–5.11)
RDW: 13 % (ref 11.5–15.5)
WBC Count: 7.9 10*3/uL (ref 4.0–10.5)
nRBC: 0 % (ref 0.0–0.2)

## 2020-11-04 NOTE — Progress Notes (Signed)
[place] [phone]   [phone]  OFFICE PROGRESS NOTE  [name], [address] [place] [address]  DIAGNOSIS: Microcytic anemia secondary to iron deficiency.  PRIOR THERAPY: Iron infusion with Venofer 300 Mg IV x3 doses.  Last dose was given 07/09/2020.  CURRENT THERAPY: Niferex 150 mg p.o. daily.  INTERVAL HISTORY: Barbara Maxwell 85 y.o. female returns to the clinic today for 53-monthfollow-up visit.  The patient is feeling fine today with no concerning complaints.  She denied having any current chest pain but she has mild cough and chest congestion after cleaning the basement with her daughter 2 days ago.  She has no shortness of breath or hemoptysis.  She denied having any fever or chills.  She has no nausea, vomiting, diarrhea or constipation.  She has no headache or visual changes.  She had upper endoscopy and colonoscopy under the care of Dr. JArdis Hughswith no concerning findings for malignancy or significant bleeding issues.  The patient is here today for evaluation with repeat CBC, iron study and ferritin.   MEDICAL HISTORY: Past Medical History:  Diagnosis Date   Anemia    B12 deficiency    [place]  MELANOMA REMOVED FROM BACK    Coronary artery disease    Depression    Hypercholesterolemia    Hypertension     ALLERGIES:  is allergic to bee venom, simvastatin, codeine, evista [raloxifene], and percocet [oxycodone-acetaminophen].  MEDICATIONS:  Current Outpatient Medications  Medication Sig Dispense Refill   acetaminophen (TYLENOL) 325 MG tablet Take 325 mg by mouth every 6 (six) hours as needed for moderate pain.      amLODipine (NORVASC) 2.5 MG tablet Take 1 tablet (2.5 mg total) by mouth daily. 90 tablet 3   aspirin EC 81 MG tablet Take 1 tablet (81 mg total) by mouth daily. Swallow whole. 90 tablet 3   bismuth-metronidazole-tetracycline (PYLERA) 140-125-125 MG  capsule Take 3 capsules by mouth 4 (four) times daily -  before meals and at bedtime for 10 days. 120 capsule 0   Calcium Carbonate-Vitamin D (CALCIUM + D PO) Take 1 tablet by mouth daily.     Carboxymethylcellulose Sodium (REFRESH TEARS OP) Place 1 drop into both eyes daily.     clopidogrel (PLAVIX) 75 MG tablet TAKE 1 TABLET DAILY WITH BREAKFAST 90 tablet 3   ezetimibe (ZETIA) 10 MG tablet TAKE 1 TABLET DAILY 90 tablet 3   ferrous sulfate 325 (65 FE) MG EC tablet Take 325 mg by mouth daily with breakfast.     iron polysaccharides (NIFEREX) 150 MG capsule Take 1 capsule (150 mg total) by mouth daily. (Patient not taking: Reported on 08/29/2020) 30 capsule 1   metoprolol succinate (TOPROL-XL) 25 MG 24 hr tablet TAKE 1 TABLET DAILY 90 tablet 3   neomycin-polymyxin-hydrocortisone (CORTISPORIN) OTIC solution Place 3 drops into the left ear 3 (three) times daily. (Patient not taking: Reported on 08/29/2020) 10 mL 0   nitroGLYCERIN (NITROSTAT) 0.4 MG SL tablet Place 1 tablet (0.4 mg total) under the tongue every 5 (five) minutes as needed for chest pain. 25 tablet 3   pantoprazole (PROTONIX) 40 MG tablet TAKE 1 TABLET(40 MG) BY MOUTH TWICE DAILY BEFORE A MEAL 60 tablet 1   rosuvastatin (CRESTOR) 40 MG tablet TAKE 1 TABLET DAILY 90 tablet 3   sertraline (ZOLOFT) 50 MG tablet Take 1 tablet (50 mg total) by mouth daily.  90 tablet 3   sodium chloride (OCEAN) 0.65 % SOLN nasal spray Place 1 spray into both nostrils daily.     No current facility-administered medications for this visit.    SURGICAL HISTORY:  Past Surgical History:  Procedure Laterality Date   ABDOMINAL HYSTERECTOMY     VAGINAL HYSTERECTOMY WITH OVARIAN PRESERVATION    ABDOMINOPLASTY  1998   APPENDECTOMY     BREAST SURGERY  1985   BREAST REDUCTION    CARDIAC CATHETERIZATION  11/19/2019   CORONARY BALLOON ANGIOPLASTY N/A 11/19/2019   Procedure: CORONARY BALLOON ANGIOPLASTY;  Surgeon: Lorretta Harp, MD;  Location: [facility];   Service: Cardiovascular;  Laterality: N/A;   LEFT HEART CATH AND CORONARY ANGIOGRAPHY N/A 11/19/2019   Procedure: LEFT HEART CATH AND CORONARY ANGIOGRAPHY;  Surgeon: Lorretta Harp, MD;  Location: [facility];  Service: Cardiovascular;  Laterality: N/A;   TONSILLECTOMY AND ADENOIDECTOMY      REVIEW OF SYSTEMS:  A comprehensive review of systems was negative.   PHYSICAL EXAMINATION: General appearance: alert, cooperative, and no distress Head: Normocephalic, without obvious abnormality, atraumatic Neck: no adenopathy, no JVD, supple, symmetrical, trachea midline, and thyroid not enlarged, symmetric, no tenderness/mass/nodules Lymph nodes: Cervical, supraclavicular, and axillary nodes normal. Resp: clear to auscultation bilaterally Back: symmetric, no curvature. ROM normal. No CVA tenderness. Cardio: regular rate and rhythm, S1, S2 normal, no murmur, click, rub or gallop GI: soft, non-tender; bowel sounds normal; no masses,  no organomegaly Extremities: extremities normal, atraumatic, no cyanosis or edema  ECOG PERFORMANCE STATUS: 1 - Symptomatic but completely ambulatory  Blood pressure 131/63, pulse 67, temperature 97.9 F (36.6 C), temperature source Tympanic, resp. rate 19, height '5\' 3"'$  (1.6 m), weight 140 lb 11.2 oz (63.8 kg), SpO2 98 %.  LABORATORY DATA: Lab Results  Component Value Date   WBC 7.9 11/04/2020   HGB 12.4 11/04/2020   HCT 37.5 11/04/2020   MCV 88.4 11/04/2020   PLT 228 11/04/2020      Chemistry      Component Value Date/Time   NA 143 06/02/2020 1251   NA 141 11/16/2019 1151   K 3.7 06/02/2020 1251   CL 110 06/02/2020 1251   CO2 22 06/02/2020 1251   BUN 15 06/02/2020 1251   BUN 13 11/16/2019 1151   CREATININE 1.01 (H) 06/02/2020 1251   CREATININE 0.77 09/07/2019 0956      Component Value Date/Time   CALCIUM 9.1 06/02/2020 1251   ALKPHOS 53 06/02/2020 1251   AST 21 06/02/2020 1251   ALT 12 06/02/2020 1251   BILITOT 0.8 06/02/2020 1251        RADIOGRAPHIC STUDIES: No results found.  ASSESSMENT AND PLAN: This is a very pleasant 85 years old white female with microcytic anemia secondary to iron deficiency likely from gastrointestinal blood loss.  The patient also has mild vitamin B12 deficiency. She was treated with iron infusion with Venofer 300 mg IV weekly for 3 weeks and tolerated it fairly well. She is currently on oral iron tablet with Niferex 150 mg p.o. daily and tolerating it fairly well.  The patient is here today for evaluation with repeat CBC, iron study and ferritin. Her CBC is unremarkable for any abnormalities and the patient has a hemoglobin of 12.4. Iron study and ferritin are still pending. I recommended for her to continue her current treatment with Niferex 150 mg p.o. daily.  She will also continue with vitamin B12 supplements. I will see the patient back for follow-up  visit in 6 months for evaluation with repeat CBC, iron study and ferritin. The patient was advised to call immediately if she has any other concerning symptoms in the interval. The patient voices understanding of current disease status and treatment options and is in agreement with the current care plan.  All questions were answered. The patient knows to call the clinic with any problems, questions or concerns. We can certainly see the patient much sooner if necessary.   Disclaimer: This note was dictated with voice recognition software. Similar sounding words can inadvertently be transcribed and may not be corrected upon review.

## 2020-11-05 ENCOUNTER — Other Ambulatory Visit: Payer: Self-pay | Admitting: Family

## 2020-11-05 DIAGNOSIS — Z20822 Contact with and (suspected) exposure to covid-19: Secondary | ICD-10-CM | POA: Diagnosis not present

## 2020-11-05 LAB — FERRITIN: Ferritin: 71 ng/mL (ref 11–307)

## 2020-11-05 LAB — IRON AND TIBC
Iron: 30 ug/dL — ABNORMAL LOW (ref 41–142)
Saturation Ratios: 10 % — ABNORMAL LOW (ref 21–57)
TIBC: 308 ug/dL (ref 236–444)
UIBC: 278 ug/dL (ref 120–384)

## 2020-11-05 NOTE — Telephone Encounter (Signed)
Patient last seen in April and I did not see when you wanted to follow up. Would you like to see if she was going to see you here before refilling?  Also for the iron supplement should she still be on?

## 2020-11-06 ENCOUNTER — Telehealth: Payer: Self-pay | Admitting: Medical Oncology

## 2020-11-06 ENCOUNTER — Telehealth: Payer: Self-pay | Admitting: Internal Medicine

## 2020-11-06 NOTE — Telephone Encounter (Signed)
Scheduled appt per 9/13 los - mailed reminder letter with appt date and time

## 2020-11-06 NOTE — Telephone Encounter (Signed)
-----   Message from Curt Bears, MD sent at 11/05/2020 10:13 PM EDT ----- Serum iron is low.  I will be happy to offer the patient additional iron infusion if she is interested.  Please let me know and I will place the order.  Thank you. ----- Message ----- From: Buel Ream, [facility] [place] Sent: 11/04/2020   3:39 PM EDT To: Curt Bears, MD

## 2020-11-06 NOTE — Telephone Encounter (Signed)
Pt has called back and has been advised as indicated. Pt states she ran out of her iron rx but had it refilled yesterday and prefers to restart her tablets. She states she will call us back if anything changes or if she wants to move forward with an infusion.

## 2020-11-07 ENCOUNTER — Other Ambulatory Visit: Payer: Self-pay

## 2020-11-07 ENCOUNTER — Ambulatory Visit (INDEPENDENT_AMBULATORY_CARE_PROVIDER_SITE_OTHER): Payer: Medicare Other | Admitting: Family

## 2020-11-07 ENCOUNTER — Other Ambulatory Visit: Payer: Self-pay | Admitting: Family

## 2020-11-07 VITALS — BP 120/68 | HR 68 | Temp 99.1°F | Ht 63.0 in | Wt 140.1 lb

## 2020-11-07 DIAGNOSIS — Z1231 Encounter for screening mammogram for malignant neoplasm of breast: Secondary | ICD-10-CM | POA: Diagnosis not present

## 2020-11-07 DIAGNOSIS — J209 Acute bronchitis, unspecified: Secondary | ICD-10-CM | POA: Diagnosis not present

## 2020-11-07 DIAGNOSIS — G8929 Other chronic pain: Secondary | ICD-10-CM | POA: Diagnosis not present

## 2020-11-07 DIAGNOSIS — D509 Iron deficiency anemia, unspecified: Secondary | ICD-10-CM | POA: Diagnosis not present

## 2020-11-07 DIAGNOSIS — M8580 Other specified disorders of bone density and structure, unspecified site: Secondary | ICD-10-CM

## 2020-11-07 DIAGNOSIS — M545 Low back pain, unspecified: Secondary | ICD-10-CM | POA: Diagnosis not present

## 2020-11-07 MED ORDER — TRAMADOL HCL 50 MG PO TABS: 50.0000 mg | ORAL_TABLET | Freq: Four times a day (QID) | ORAL | 0 refills | Status: AC | PRN

## 2020-11-07 MED ORDER — DOXYCYCLINE HYCLATE 100 MG PO TABS: 100.0000 mg | ORAL_TABLET | Freq: Two times a day (BID) | ORAL | 0 refills | Status: DC

## 2020-11-07 MED ORDER — POLYSACCHARIDE IRON COMPLEX 150 MG PO CAPS: 150.0000 mg | ORAL_CAPSULE | Freq: Every day | ORAL | 3 refills | Status: DC

## 2020-11-07 NOTE — Progress Notes (Signed)
Barbara Maxwell is a 85 y.o. female with the following history as recorded in EpicCare:  Patient Active Problem List   Diagnosis Date Noted   Chronic low back pain without sciatica 11/07/2020   Antiplatelet or antithrombotic long-term use 06/13/2020   Iron deficiency anemia 06/02/2020   CAD (coronary artery disease) 11/19/2019   Atypical chest pain 09/28/2019   Dyspnea on exertion 09/28/2019   H/O vitamin D deficiency 01/21/2015   Osteopenia 01/21/2015   Vaginal atrophy 01/21/2015   Mixed hyperlipidemia 02/04/2014   Family history of ovarian cancer 11/13/2012   Rectocele 11/07/2012   Hyperlipidemia 09/29/2012   Essential hypertension 08/07/2009   ALLERGIC RHINITIS 08/07/2009   HYPERSOMNIA WITH SLEEP APNEA UNSPECIFIED 08/04/2009    Current Outpatient Medications  Medication Sig Dispense Refill   acetaminophen (TYLENOL) 325 MG tablet Take 325 mg by mouth every 6 (six) hours as needed for moderate pain.      amLODipine (NORVASC) 2.5 MG tablet Take 1 tablet (2.5 mg total) by mouth daily. 90 tablet 3   aspirin EC 81 MG tablet Take 1 tablet (81 mg total) by mouth daily. Swallow whole. 90 tablet 3   Calcium Carbonate-Vitamin D (CALCIUM + D PO) Take 1 tablet by mouth daily.     Carboxymethylcellulose Sodium (REFRESH TEARS OP) Place 1 drop into both eyes daily.     clopidogrel (PLAVIX) 75 MG tablet TAKE 1 TABLET DAILY WITH BREAKFAST 90 tablet 3   doxycycline (VIBRA-TABS) 100 MG tablet Take 1 tablet (100 mg total) by mouth 2 (two) times daily. 14 tablet 0   ezetimibe (ZETIA) 10 MG tablet TAKE 1 TABLET DAILY 90 tablet 3   ferrous sulfate 325 (65 FE) MG EC tablet Take 325 mg by mouth daily with breakfast.     iron polysaccharides (NIFEREX) 150 MG capsule Take 1 capsule (150 mg total) by mouth daily. 90 capsule 3   metoprolol succinate (TOPROL-XL) 25 MG 24 hr tablet TAKE 1 TABLET DAILY 90 tablet 3   nitroGLYCERIN (NITROSTAT) 0.4 MG SL tablet Place 1 tablet (0.4 mg total) under the tongue every 5  (five) minutes as needed for chest pain. 25 tablet 3   pantoprazole (PROTONIX) 40 MG tablet TAKE 1 TABLET(40 MG) BY MOUTH TWICE DAILY BEFORE A MEAL 60 tablet 1   rosuvastatin (CRESTOR) 40 MG tablet TAKE 1 TABLET DAILY 90 tablet 3   sertraline (ZOLOFT) 50 MG tablet Take 1 tablet (50 mg total) by mouth daily. 90 tablet 3   sodium chloride (OCEAN) 0.65 % SOLN nasal spray Place 1 spray into both nostrils daily.     traMADol (ULTRAM) 50 MG tablet Take 1 tablet (50 mg total) by mouth every 6 (six) hours as needed for up to 7 days. 30 tablet 0   No current facility-administered medications for this visit.    Allergies: Bee venom, Simvastatin, Codeine, Evista [raloxifene], and Percocet [oxycodone-acetaminophen]  Past Medical History:  Diagnosis Date   Anemia    B12 deficiency    Cancer ([place]    [facility]-  MELANOMA REMOVED FROM BACK    Coronary artery disease    Depression    Hypercholesterolemia    Hypertension     Past Surgical History:  Procedure Laterality Date   ABDOMINAL HYSTERECTOMY     VAGINAL HYSTERECTOMY WITH OVARIAN PRESERVATION    ABDOMINOPLASTY  1998   APPENDECTOMY     BREAST SURGERY  1985   BREAST REDUCTION    CARDIAC CATHETERIZATION  11/19/2019   CORONARY BALLOON ANGIOPLASTY N/A  11/19/2019   Procedure: CORONARY BALLOON ANGIOPLASTY;  Surgeon: Lorretta Harp, MD;  Location: [facility];  Service: Cardiovascular;  Laterality: N/A;   LEFT HEART CATH AND CORONARY ANGIOGRAPHY N/A 11/19/2019   Procedure: LEFT HEART CATH AND CORONARY ANGIOGRAPHY;  Surgeon: Lorretta Harp, MD;  Location: [facility];  Service: Cardiovascular;  Laterality: N/A;   TONSILLECTOMY AND ADENOIDECTOMY      Family History  Problem Relation Age of Onset   Hypertension Mother    Heart disease Father    Diabetes Father    Cancer Sister 45       OVARIAN    Thyroid disease Sister    Heart disease Brother    Diabetes Paternal Grandmother    Thyroid disease Daughter    Colon cancer  Neg Hx    Esophageal cancer Neg Hx    Rectal cancer Neg Hx    Stomach cancer Neg Hx     Social History   Tobacco Use   Smoking status: Never   Smokeless tobacco: Never  Substance Use Topics   Alcohol use: No    Alcohol/week: 0.0 standard drinks    Subjective:  Concerned for worsening cough/ congestion; seemed to start after working to clean out musty basement; + sore throat; no chest pain or shortness of breath;  No OTC medications;  Requesting refill on Niferex- scheduled to see hematology in March for re-check; not planning repeat iron infusion soon;  Needs refill on Tramadol as well;     Objective:  Vitals:   11/07/20 0932  BP: 120/68  Pulse: 68  Temp: 99.1 F (37.3 C)  TempSrc: Oral  SpO2: 96%  Weight: 140 lb 1.6 oz (63.5 kg)  Height: '5\' 3"'$  (1.6 m)    General: Well developed, well nourished, in no acute distress  Skin : Warm and dry.  Head: Normocephalic and atraumatic  Eyes: Sclera and conjunctiva clear; pupils round and reactive to light; extraocular movements intact  Ears: External normal; canals clear; tympanic membranes normal  Oropharynx: Pink, supple. No suspicious lesions  Neck: Supple without thyromegaly, adenopathy  Lungs: Respirations unlabored; clear to auscultation bilaterally without wheeze, rales, rhonchi  CVS exam: normal rate and regular rhythm.  Neurologic: Alert and oriented; speech intact; face symmetrical; moves all extremities well; CNII-XII intact without focal deficit   Assessment:  1. Acute bronchitis, unspecified organism   2. Visit for screening mammogram   3. Iron deficiency anemia, unspecified iron deficiency anemia type   4. Osteopenia, unspecified location   5. Chronic low back pain without sciatica, unspecified back pain laterality     Plan:  Rx for Doxycyline 100 mg bid x 7 days; okay to use Mucinex prn;  Order updated; Order for Niferex updated; Plan for DEXA in 2023; Refill on Tramadol updated; last gave #30 in July  2021;   This visit occurred during the SARS-CoV-2 public health emergency.  Safety protocols were in place, including screening questions prior to the visit, additional usage of staff PPE, and extensive cleaning of exam room while observing appropriate contact time as indicated for disinfecting solutions.    No follow-ups on file.  Orders Placed This Encounter  Procedures   MM Digital Screening    Standing Status:   Future    Standing Expiration Date:   11/07/2021    Scheduling Instructions:     [facility]    Order Specific Question:   Reason for Exam (SYMPTOM  OR DIAGNOSIS REQUIRED)    Answer:  screening mammogram    Order Specific Question:   Preferred imaging location?    Answer:   External    Requested Prescriptions   Signed Prescriptions Disp Refills   doxycycline (VIBRA-TABS) 100 MG tablet 14 tablet 0    Sig: Take 1 tablet (100 mg total) by mouth 2 (two) times daily.   iron polysaccharides (NIFEREX) 150 MG capsule 90 capsule 3    Sig: Take 1 capsule (150 mg total) by mouth daily.

## 2020-11-07 NOTE — Patient Instructions (Signed)
Please get OTC Mucinex to help with the cough/ congestion;

## 2020-11-10 ENCOUNTER — Other Ambulatory Visit: Payer: Self-pay | Admitting: *Deleted

## 2020-11-10 MED ORDER — POLYSACCHARIDE IRON COMPLEX 150 MG PO CAPS: 150.0000 mg | ORAL_CAPSULE | Freq: Every day | ORAL | 3 refills | Status: DC

## 2020-11-15 ENCOUNTER — Other Ambulatory Visit: Payer: Self-pay | Admitting: Family

## 2020-11-19 DIAGNOSIS — E782 Mixed hyperlipidemia: Secondary | ICD-10-CM | POA: Diagnosis not present

## 2020-11-19 DIAGNOSIS — Z79899 Other long term (current) drug therapy: Secondary | ICD-10-CM | POA: Diagnosis not present

## 2020-11-19 DIAGNOSIS — I25118 Atherosclerotic heart disease of native coronary artery with other forms of angina pectoris: Secondary | ICD-10-CM | POA: Diagnosis not present

## 2020-11-19 LAB — LIPID PANEL
Chol/HDL Ratio: 3.3 ratio (ref 0.0–4.4)
Cholesterol, Total: 153 mg/dL (ref 100–199)
HDL: 47 mg/dL (ref >39)
LDL Chol Calc (NIH): 80 mg/dL (ref 0–99)
Triglycerides: 153 mg/dL — ABNORMAL HIGH (ref 0–149)
VLDL Cholesterol Cal: 26 mg/dL (ref 5–40)

## 2020-11-20 ENCOUNTER — Other Ambulatory Visit: Payer: Self-pay

## 2020-11-20 DIAGNOSIS — I10 Essential (primary) hypertension: Secondary | ICD-10-CM

## 2020-11-20 DIAGNOSIS — R931 Abnormal findings on diagnostic imaging of heart and coronary circulation: Secondary | ICD-10-CM

## 2020-11-20 DIAGNOSIS — I25118 Atherosclerotic heart disease of native coronary artery with other forms of angina pectoris: Secondary | ICD-10-CM

## 2020-11-20 DIAGNOSIS — Z79899 Other long term (current) drug therapy: Secondary | ICD-10-CM

## 2020-11-20 DIAGNOSIS — E782 Mixed hyperlipidemia: Secondary | ICD-10-CM

## 2020-11-20 MED ORDER — FENOFIBRATE MICRONIZED 43 MG PO CAPS: 43.0000 mg | ORAL_CAPSULE | Freq: Every day | ORAL | 6 refills | Status: DC

## 2020-11-26 ENCOUNTER — Other Ambulatory Visit: Payer: Self-pay

## 2020-11-27 ENCOUNTER — Telehealth: Payer: Self-pay

## 2020-11-27 MED ORDER — FENOFIBRATE 54 MG PO TABS: 54.0000 mg | ORAL_TABLET | Freq: Every day | ORAL | 3 refills | Status: DC

## 2020-11-27 NOTE — Telephone Encounter (Signed)
New rx sent to pharmacy

## 2020-11-27 NOTE — Telephone Encounter (Signed)
**Note De-Identified Rayette Mogg Obfuscation** Letter received Jaysha Lasure fax from [employer] stating that they are denying coverage of Fenofibrate Micronized 43 mg as the pt must first try and fail all step 1 medications prior to considering coverage of a more expensive medication. Step 1 medications are: Lofibra/Fenofibrate 54 mg Tabs Lofibra/Fenofibrate 67 mg caps Lofibra/Fenofibrate 134 mg caps Gemfibrozil Tricor 48 mg  Will forward to Coletta Memos, NP for advisement.

## 2020-12-05 DIAGNOSIS — Z23 Encounter for immunization: Secondary | ICD-10-CM | POA: Diagnosis not present

## 2020-12-16 DIAGNOSIS — Z1231 Encounter for screening mammogram for malignant neoplasm of breast: Secondary | ICD-10-CM | POA: Diagnosis not present

## 2020-12-16 LAB — HM MAMMOGRAPHY

## 2020-12-29 ENCOUNTER — Other Ambulatory Visit: Payer: Self-pay

## 2020-12-29 NOTE — Telephone Encounter (Signed)
This is Dr. Berry's pt 

## 2021-01-05 ENCOUNTER — Encounter: Payer: Self-pay | Admitting: *Deleted

## 2021-01-09 ENCOUNTER — Other Ambulatory Visit: Payer: Self-pay

## 2021-01-09 NOTE — Telephone Encounter (Signed)
This is Dr. Berry's pt 

## 2021-01-12 ENCOUNTER — Other Ambulatory Visit: Payer: Self-pay

## 2021-01-12 MED ORDER — AMLODIPINE BESYLATE 2.5 MG PO TABS: 2.5000 mg | ORAL_TABLET | Freq: Every day | ORAL | 3 refills | Status: DC

## 2021-01-14 ENCOUNTER — Other Ambulatory Visit: Payer: Self-pay

## 2021-01-14 NOTE — Telephone Encounter (Signed)
Needed medication resent to mail order pharmacy [facility]. Please address

## 2021-01-26 ENCOUNTER — Other Ambulatory Visit: Payer: Self-pay

## 2021-01-26 DIAGNOSIS — I10 Essential (primary) hypertension: Secondary | ICD-10-CM | POA: Diagnosis not present

## 2021-01-26 DIAGNOSIS — R931 Abnormal findings on diagnostic imaging of heart and coronary circulation: Secondary | ICD-10-CM

## 2021-01-26 DIAGNOSIS — Z79899 Other long term (current) drug therapy: Secondary | ICD-10-CM

## 2021-01-26 DIAGNOSIS — E782 Mixed hyperlipidemia: Secondary | ICD-10-CM

## 2021-01-26 DIAGNOSIS — I25118 Atherosclerotic heart disease of native coronary artery with other forms of angina pectoris: Secondary | ICD-10-CM | POA: Diagnosis not present

## 2021-01-26 LAB — LIPID PANEL
Chol/HDL Ratio: 3.1 ratio (ref 0.0–4.4)
Cholesterol, Total: 155 mg/dL (ref 100–199)
HDL: 50 mg/dL (ref >39)
LDL Chol Calc (NIH): 85 mg/dL (ref 0–99)
Triglycerides: 111 mg/dL (ref 0–149)
VLDL Cholesterol Cal: 20 mg/dL (ref 5–40)

## 2021-01-26 LAB — HEPATIC FUNCTION PANEL
ALT: 16 IU/L (ref 0–32)
AST: 25 IU/L (ref 0–40)
Albumin: 4.5 g/dL (ref 3.6–4.6)
Alkaline Phosphatase: 52 IU/L (ref 44–121)
Bilirubin Total: 0.8 mg/dL (ref 0.0–1.2)
Bilirubin, Direct: 0.23 mg/dL (ref 0.00–0.40)
Total Protein: 6.5 g/dL (ref 6.0–8.5)

## 2021-03-04 ENCOUNTER — Encounter: Payer: Self-pay | Admitting: Cardiovascular Disease

## 2021-03-04 ENCOUNTER — Ambulatory Visit (INDEPENDENT_AMBULATORY_CARE_PROVIDER_SITE_OTHER): Payer: Medicare Other | Admitting: Cardiovascular Disease

## 2021-03-04 ENCOUNTER — Other Ambulatory Visit: Payer: Self-pay

## 2021-03-04 DIAGNOSIS — I10 Essential (primary) hypertension: Secondary | ICD-10-CM | POA: Diagnosis not present

## 2021-03-04 DIAGNOSIS — I2510 Atherosclerotic heart disease of native coronary artery without angina pectoris: Secondary | ICD-10-CM | POA: Diagnosis not present

## 2021-03-04 DIAGNOSIS — E782 Mixed hyperlipidemia: Secondary | ICD-10-CM | POA: Diagnosis not present

## 2021-03-04 MED ORDER — FENOFIBRATE 54 MG PO TABS: 54.0000 mg | ORAL_TABLET | Freq: Every day | ORAL | 3 refills | Status: DC

## 2021-03-04 MED ORDER — NITROGLYCERIN 0.4 MG SL SUBL: 0.4000 mg | SUBLINGUAL_TABLET | SUBLINGUAL | 5 refills | Status: DC | PRN

## 2021-03-04 NOTE — Patient Instructions (Signed)
Medication Instructions:  °Your physician recommends that you continue on your current medications as directed. Please refer to the Current Medication list given to you today. ° °*If you need a refill on your cardiac medications before your next appointment, please call your pharmacy* ° ° °Follow-Up: °At [facility], you and your health needs are our priority.  As part of our continuing mission to provide you with exceptional heart care, we have created designated Provider Care Teams.  These Care Teams include your primary Cardiologist (physician) and Advanced Practice Providers (APPs -  Physician Assistants and Nurse Practitioners) who all work together to provide you with the care you need, when you need it. ° °We recommend signing up for the patient portal called "MyChart".  Sign up information is provided on this After Visit Summary.  MyChart is used to connect with patients for Virtual Visits (Telemedicine).  Patients are able to view lab/test results, encounter notes, upcoming appointments, etc.  Non-urgent messages can be sent to your provider as well.   °To learn more about what you can do with MyChart, go to [email or web address].   ° °Your next appointment:   °6 month(s) ° °The format for your next appointment:   °In Person ° °Provider:   °Jesse Cleaver, FNP     ° °Then, Jonathan Berry, MD will plan to see you again in 12 month(s). °

## 2021-03-04 NOTE — Assessment & Plan Note (Signed)
History of essential hypertension a blood pressure measured today at 116/70.  She is on amlodipine, and metoprolol.

## 2021-03-04 NOTE — Assessment & Plan Note (Signed)
History of CAD status post cardiac catheterization performed 11/19/2019 after coronary calcium score measured 1681 suggesting RCA and LAD disease.  This demonstrated moderately severe proximal LAD diagonal branch heavily calcified bifurcation disease as well as a 99% ostial dominant RCA stenosis with grade 2-3 left-to-right collaterals and normal LV function.  I attempted to perform orbital atherectomy, PCI drug-eluting stenting.  I did place a temporary pacemaker.  Unfortunately, I was never able to successfully pass a wire and confirmed that I was intraluminal and therefore I aborted the procedure.  Since I saw her 8 months ago she is only had to take 3 sublingual nitroglycerin but otherwise denies chest pain.

## 2021-03-04 NOTE — Progress Notes (Signed)
03/04/2021 Barbara Maxwell   1935/10/16  [id number]  Primary Physician [name], [place] Primary Cardiologist: Lorretta Harp MD Lupe Carney, [place]  HPI:  Barbara Maxwell is a 86 y.o.  mildly overweight widowed Caucasian female (husband of 85 years recently passed away), mother of 3 living children (daughter 33 committed suicide), grandmother 16 grandchildren referred by Dr. Valere Dross for cardiovascular valuation because of atypical chest pain and dyspnea.   I last saw her in the office 05/30/2020. Risk factors are only notable for hyperlipidemia treated.  Her father apparently did have bypass surgery and she had a son who had a stent.  She is never had a heart attack or stroke.  She is fairly active and does water aerobics 2 days/week.  She is noticed some dyspnea walking up an incline recently and some atypical chest pain.  She does admit to anxiety since she is living alone currently.   I obtained a 2D echocardiogram on 10/15/2019 which was essentially normal with grade 1 diastolic dysfunction. A coronary calcium score was measured at 1681 with suggestion of proximal to mid RCA disease and LAD disease.   Based on this I performed outpatient diagnostic coronary angiography on her 11/19/2019 with a right radial approach revealing a 60% proximal LAD lesion just after the first diagonal branch which I did not think appeared physiologically significant and a 99% calcified ostial RCA with grade 2-3 left-to-right collaterals.  I placed a temporary transvenous pacemaker via the right common femoral vein and did attempt orbital atherectomy PCI and stenting however I was able to cross with a wire but was unable to confirm that I was intraluminal and therefore aborted the procedure.  My plan was to treat her medically initially with intentional medications were history of intervention for recalcitrant symptoms   She  was seen in the ER after developing left arm pain during a church service and taking  sublingual nitroglycerin.  She became presyncopal.  She was evaluated and found to be anemic with a hemoglobin of 7.7 for unclear reasons and was transfused 1 unit of packed red blood cells resulting in an increase in her hemoglobin to 9.1.  She is on oral iron replacement and is scheduled to have colonoscopy.  When I initially saw her she was having significant dyspnea walking up a hill but this has become less of an issue.  Since I saw her 8 months ago she continues to do well.  She has only needed to take sublingual nitroglycerin 3 times in the last 8 months.  She does water aerobics 2 times a week and is relatively asymptomatic.   Current Meds  Medication Sig   acetaminophen (TYLENOL) 325 MG tablet Take 325 mg by mouth every 6 (six) hours as needed for moderate pain.    amLODipine (NORVASC) 2.5 MG tablet Take 1 tablet (2.5 mg total) by mouth daily.   Calcium Carbonate-Vitamin D (CALCIUM + D PO) Take 1 tablet by mouth daily.   Carboxymethylcellulose Sodium (REFRESH TEARS OP) Place 1 drop into both eyes daily.   clopidogrel (PLAVIX) 75 MG tablet TAKE 1 TABLET DAILY WITH BREAKFAST   ezetimibe (ZETIA) 10 MG tablet TAKE 1 TABLET DAILY   fenofibrate 54 MG tablet Take 1 tablet (54 mg total) by mouth daily.   ferrous sulfate 325 (65 FE) MG EC tablet Take 325 mg by mouth daily with breakfast.   iron polysaccharides (NIFEREX) 150 MG capsule Take 1 capsule (150 mg total) by mouth  daily.   metoprolol succinate (TOPROL-XL) 25 MG 24 hr tablet TAKE 1 TABLET DAILY   pantoprazole (PROTONIX) 40 MG tablet TAKE 1 TABLET(40 MG) BY MOUTH TWICE DAILY BEFORE A MEAL   rosuvastatin (CRESTOR) 40 MG tablet TAKE 1 TABLET DAILY   sertraline (ZOLOFT) 50 MG tablet Take 1 tablet (50 mg total) by mouth daily.   sodium chloride (OCEAN) 0.65 % SOLN nasal spray Place 1 spray into both nostrils daily.   traMADol (ULTRAM) 50 MG tablet Take 50 mg by mouth every 6 (six) hours as needed.     Allergies  Allergen Reactions   Bee  Venom Anaphylaxis   Simvastatin     Myopathy   Codeine Nausea Only   Evista [Raloxifene]     Hot flashes   Percocet [Oxycodone-Acetaminophen] Nausea And Vomiting    Social History   Socioeconomic History   Marital status: Widowed    Spouse name: Not on file   Number of children: 4   Years of education: Not on file   Highest education level: Not on file  Occupational History   Occupation: retired  Tobacco Use   Smoking status: Never   Smokeless tobacco: Never  Vaping Use   Vaping Use: Never used  Substance and Sexual Activity   Alcohol use: No    Alcohol/week: 0.0 standard drinks   Drug use: Not on file   Sexual activity: Not Currently    Comment: 1st intercourse- 20, partners- 2, widow  Other Topics Concern   Not on file  Social History Narrative   Not on file   Social Determinants of Health   Financial Resource Strain: Not on file  Food Insecurity: Not on file  Transportation Needs: Not on file  Physical Activity: Not on file  Stress: Not on file  Social Connections: Not on file  Intimate Partner Violence: Not on file     Review of Systems: General: negative for chills, fever, night sweats or weight changes.  Cardiovascular: negative for chest pain, dyspnea on exertion, edema, orthopnea, palpitations, paroxysmal nocturnal dyspnea or shortness of breath Dermatological: negative for rash Respiratory: negative for cough or wheezing Urologic: negative for hematuria Abdominal: negative for nausea, vomiting, diarrhea, bright red blood per rectum, melena, or hematemesis Neurologic: negative for visual changes, syncope, or dizziness All other systems reviewed and are otherwise negative except as noted above.    Blood pressure 116/70, pulse 62, resp. rate 20, height 5\' 3"  (1.6 m), weight 141 lb (64 kg), SpO2 98 %.  General appearance: alert and no distress Neck: no adenopathy, no carotid bruit, no JVD, supple, symmetrical, trachea midline, and thyroid not enlarged,  symmetric, no tenderness/mass/nodules Lungs: clear to auscultation bilaterally Heart: regular rate and rhythm, S1, S2 normal, no murmur, click, rub or gallop Abdomen: soft, non-tender; bowel sounds normal; no masses,  no organomegaly  EKG sinus rhythm at 62 with poor R wave progression.  I personally reviewed this EKG.  ASSESSMENT AND PLAN:   Essential hypertension History of essential hypertension a blood pressure measured today at 116/70.  She is on amlodipine, and metoprolol.  Hyperlipidemia History of hyperlipidemia on high-dose simvastatin with lipid profile performed 01/26/2021 revealing total cholesterol 155, LDL of 85 and HDL 50.  CAD (coronary artery disease) History of CAD status post cardiac catheterization performed 11/19/2019 after coronary calcium score measured 1681 suggesting RCA and LAD disease.  This demonstrated moderately severe proximal LAD diagonal branch heavily calcified bifurcation disease as well as a 99% ostial dominant RCA stenosis with grade  2-3 left-to-right collaterals and normal LV function.  I attempted to perform orbital atherectomy, PCI drug-eluting stenting.  I did place a temporary pacemaker.  Unfortunately, I was never able to successfully pass a wire and confirmed that I was intraluminal and therefore I aborted the procedure.  Since I saw her 8 months ago she is only had to take 3 sublingual nitroglycerin but otherwise denies chest pain.     Lorretta Harp MD FACP,FACC,FAHA, [facility] 03/04/2021 10:34 AM

## 2021-03-04 NOTE — Addendum Note (Signed)
Addended by: Beatrix Fetters on: 03/04/2021 11:24 AM   Modules accepted: Orders

## 2021-03-04 NOTE — Assessment & Plan Note (Signed)
History of hyperlipidemia on high-dose simvastatin with lipid profile performed 01/26/2021 revealing total cholesterol 155, LDL of 85 and HDL 50.

## 2021-03-13 ENCOUNTER — Other Ambulatory Visit: Payer: Self-pay

## 2021-03-13 DIAGNOSIS — I2510 Atherosclerotic heart disease of native coronary artery without angina pectoris: Secondary | ICD-10-CM

## 2021-03-13 MED ORDER — NITROGLYCERIN 0.4 MG SL SUBL: 0.4000 mg | SUBLINGUAL_TABLET | SUBLINGUAL | 5 refills | Status: DC | PRN

## 2021-03-18 ENCOUNTER — Other Ambulatory Visit: Payer: Self-pay | Admitting: General Practice

## 2021-03-20 ENCOUNTER — Other Ambulatory Visit: Payer: Self-pay | Admitting: Family

## 2021-03-23 ENCOUNTER — Telehealth: Payer: Self-pay | Admitting: Family

## 2021-03-23 ENCOUNTER — Telehealth: Payer: Self-pay | Admitting: Cardiovascular Disease

## 2021-03-23 ENCOUNTER — Other Ambulatory Visit: Payer: Self-pay

## 2021-03-23 MED ORDER — FENOFIBRATE 54 MG PO TABS: ORAL_TABLET | ORAL | 3 refills | Status: DC

## 2021-03-23 NOTE — Telephone Encounter (Signed)
°*  STAT* If patient is at the pharmacy, call can be transferred to refill team.   1. Which medications need to be refilled? (please list name of each medication and dose if known)  amLODipine (NORVASC) 2.5 MG tablet Take 1 tablet (2.5 mg total) by mouth daily.   2. Which pharmacy/location (including street and city if local pharmacy) is medication to be sent to?[place], [place]  Phone:  [phone] Fax:  [phone]  3. Do they need a 30 day or 90 day supply? 90ds

## 2021-03-23 NOTE — Telephone Encounter (Signed)
Medication: pantoprazole (PROTONIX) 40 MG tablet   Has the patient contacted their pharmacy? Yes.     Preferred Pharmacy: [facility], [place]  [address], [place] [address]  Phone:  [phone]  Fax:  [phone]

## 2021-03-24 MED ORDER — PANTOPRAZOLE SODIUM 40 MG PO TBEC: DELAYED_RELEASE_TABLET | ORAL | 2 refills | Status: DC

## 2021-03-24 NOTE — Telephone Encounter (Signed)
Rx sent to pharmacy   

## 2021-03-24 NOTE — Telephone Encounter (Signed)
Rx has been set to pharmacy.

## 2021-03-31 ENCOUNTER — Encounter: Payer: Self-pay | Admitting: Family

## 2021-03-31 ENCOUNTER — Ambulatory Visit (INDEPENDENT_AMBULATORY_CARE_PROVIDER_SITE_OTHER): Payer: Medicare Other | Admitting: Family

## 2021-03-31 VITALS — BP 100/62 | HR 76 | Temp 98.6°F | Ht 63.0 in | Wt 144.2 lb

## 2021-03-31 DIAGNOSIS — D509 Iron deficiency anemia, unspecified: Secondary | ICD-10-CM | POA: Diagnosis not present

## 2021-03-31 DIAGNOSIS — K219 Gastro-esophageal reflux disease without esophagitis: Secondary | ICD-10-CM

## 2021-03-31 DIAGNOSIS — E782 Mixed hyperlipidemia: Secondary | ICD-10-CM | POA: Diagnosis not present

## 2021-03-31 DIAGNOSIS — G8929 Other chronic pain: Secondary | ICD-10-CM

## 2021-03-31 DIAGNOSIS — M545 Low back pain, unspecified: Secondary | ICD-10-CM

## 2021-03-31 LAB — CBC WITH DIFFERENTIAL/PLATELET
Basophils Absolute: 0 10*3/uL (ref 0.0–0.1)
Basophils Relative: 0.3 % (ref 0.0–3.0)
Eosinophils Absolute: 0.2 10*3/uL (ref 0.0–0.7)
Eosinophils Relative: 3.1 % (ref 0.0–5.0)
HCT: 39.9 % (ref 36.0–46.0)
Hemoglobin: 13.1 g/dL (ref 12.0–15.0)
Lymphocytes Relative: 40.6 % (ref 12.0–46.0)
Lymphs Abs: 2.5 10*3/uL (ref 0.7–4.0)
MCHC: 32.9 g/dL (ref 30.0–36.0)
MCV: 86.5 fl (ref 78.0–100.0)
Monocytes Absolute: 0.5 10*3/uL (ref 0.1–1.0)
Monocytes Relative: 8.1 % (ref 3.0–12.0)
Neutro Abs: 3 10*3/uL (ref 1.4–7.7)
Neutrophils Relative %: 47.9 % (ref 43.0–77.0)
Platelets: 231 10*3/uL (ref 150.0–400.0)
RBC: 4.61 Mil/uL (ref 3.87–5.11)
RDW: 13.8 % (ref 11.5–15.5)
WBC: 6.2 10*3/uL (ref 4.0–10.5)

## 2021-03-31 LAB — IRON,TIBC AND FERRITIN PANEL
%SAT: 25 % (calc) (ref 16–45)
Ferritin: 26 ng/mL (ref 16–288)
Iron: 96 ug/dL (ref 45–160)
TIBC: 380 mcg/dL (calc) (ref 250–450)

## 2021-03-31 MED ORDER — POLYSACCHARIDE IRON COMPLEX 150 MG PO CAPS: 150.0000 mg | ORAL_CAPSULE | Freq: Every day | ORAL | 3 refills | Status: DC

## 2021-03-31 MED ORDER — TRAMADOL HCL 50 MG PO TABS: 50.0000 mg | ORAL_TABLET | Freq: Four times a day (QID) | ORAL | 1 refills | Status: DC | PRN

## 2021-03-31 MED ORDER — ROSUVASTATIN CALCIUM 40 MG PO TABS: 40.0000 mg | ORAL_TABLET | Freq: Every day | ORAL | 3 refills | Status: DC

## 2021-03-31 MED ORDER — EZETIMIBE 10 MG PO TABS: 10.0000 mg | ORAL_TABLET | Freq: Every day | ORAL | 3 refills | Status: DC

## 2021-03-31 NOTE — Progress Notes (Signed)
Barbara Maxwell is a 86 y.o. female with the following history as recorded in EpicCare:  Patient Active Problem List   Diagnosis Date Noted   Chronic low back pain without sciatica 11/07/2020   Antiplatelet or antithrombotic long-term use 06/13/2020   Iron deficiency anemia 06/02/2020   CAD (coronary artery disease) 11/19/2019   Atypical chest pain 09/28/2019   Dyspnea on exertion 09/28/2019   H/O vitamin D deficiency 01/21/2015   Osteopenia 01/21/2015   Vaginal atrophy 01/21/2015   Mixed hyperlipidemia 02/04/2014   Family history of ovarian cancer 11/13/2012   Rectocele 11/07/2012   Hyperlipidemia 09/29/2012   Essential hypertension 08/07/2009   ALLERGIC RHINITIS 08/07/2009   HYPERSOMNIA WITH SLEEP APNEA UNSPECIFIED 08/04/2009    Current Outpatient Medications  Medication Sig Dispense Refill   acetaminophen (TYLENOL) 325 MG tablet Take 325 mg by mouth every 6 (six) hours as needed for moderate pain.      amLODipine (NORVASC) 2.5 MG tablet Take 1 tablet (2.5 mg total) by mouth daily. 90 tablet 3   Calcium Carbonate-Vitamin D (CALCIUM + D PO) Take 1 tablet by mouth daily.     Carboxymethylcellulose Sodium (REFRESH TEARS OP) Place 1 drop into both eyes daily.     clopidogrel (PLAVIX) 75 MG tablet TAKE 1 TABLET DAILY WITH BREAKFAST 90 tablet 3   fenofibrate 54 MG tablet TAKE 1 TABLET(54 MG) BY MOUTH DAILY 90 tablet 3   metoprolol succinate (TOPROL-XL) 25 MG 24 hr tablet TAKE 1 TABLET DAILY 90 tablet 3   nitroGLYCERIN (NITROSTAT) 0.4 MG SL tablet Place 1 tablet (0.4 mg total) under the tongue every 5 (five) minutes as needed for chest pain. 25 tablet 5   pantoprazole (PROTONIX) 40 MG tablet TAKE 1 TABLET(40 MG) BY MOUTH TWICE DAILY BEFORE A MEAL 180 tablet 2   sertraline (ZOLOFT) 50 MG tablet TAKE 1 TABLET DAILY 90 tablet 1   sodium chloride (OCEAN) 0.65 % SOLN nasal spray Place 1 spray into both nostrils daily.     ezetimibe (ZETIA) 10 MG tablet Take 1 tablet (10 mg total) by mouth  daily. 90 tablet 3   iron polysaccharides (NIFEREX) 150 MG capsule Take 1 capsule (150 mg total) by mouth daily. 90 capsule 3   rosuvastatin (CRESTOR) 40 MG tablet Take 1 tablet (40 mg total) by mouth daily. 90 tablet 3   traMADol (ULTRAM) 50 MG tablet Take 1 tablet (50 mg total) by mouth every 6 (six) hours as needed. 30 tablet 1   No current facility-administered medications for this visit.    Allergies: Bee venom, Simvastatin, Codeine, Evista [raloxifene], and Percocet [oxycodone-acetaminophen]  Past Medical History:  Diagnosis Date   Anemia    B12 deficiency    Cancer ([place])    [facility]-  MELANOMA REMOVED FROM BACK    Coronary artery disease    Depression    Hypercholesterolemia    Hypertension     Past Surgical History:  Procedure Laterality Date   ABDOMINAL HYSTERECTOMY     VAGINAL HYSTERECTOMY WITH OVARIAN PRESERVATION    ABDOMINOPLASTY  1998   APPENDECTOMY     BREAST SURGERY  1985   BREAST REDUCTION    CARDIAC CATHETERIZATION  11/19/2019   CORONARY BALLOON ANGIOPLASTY N/A 11/19/2019   Procedure: CORONARY BALLOON ANGIOPLASTY;  Surgeon: Lorretta Harp, MD;  Location: [facility];  Service: Cardiovascular;  Laterality: N/A;   LEFT HEART CATH AND CORONARY ANGIOGRAPHY N/A 11/19/2019   Procedure: LEFT HEART CATH AND CORONARY ANGIOGRAPHY;  Surgeon: Gwenlyn Found,  Pearletha Forge, MD;  Location: [facility];  Service: Cardiovascular;  Laterality: N/A;   TONSILLECTOMY AND ADENOIDECTOMY      Family History  Problem Relation Age of Onset   Hypertension Mother    Heart disease Father    Diabetes Father    Cancer Sister 73       OVARIAN    Thyroid disease Sister    Heart disease Brother    Diabetes Paternal Grandmother    Thyroid disease Daughter    Colon cancer Neg Hx    Esophageal cancer Neg Hx    Rectal cancer Neg Hx    Stomach cancer Neg Hx     Social History   Tobacco Use   Smoking status: Never   Smokeless tobacco: Never  Substance Use Topics   Alcohol  use: No    Alcohol/week: 0.0 standard drinks    Subjective:  Patient presents with requests to have prescription updated to mail order pharmacy; Is concerned that she may be anemic again- has noticed that fingernails look white in color; overall has been feeling very good lately; Denies any chest pain, shortness of breath, blurred vision or headache.    Objective:  Vitals:   03/31/21 1305 03/31/21 1327  BP: (!) 94/58 100/62  Pulse: 76   Temp: 98.6 F (37 C)   TempSrc: Oral   SpO2: 96%   Weight: 144 lb 3.2 oz (65.4 kg)   Height: 5\' 3"  (1.6 m)     General: Well developed, well nourished, in no acute distress  Skin : Warm and dry.  Head: Normocephalic and atraumatic  Eyes: Sclera and conjunctiva clear; pupils round and reactive to light; extraocular movements intact  Ears: External normal; canals clear; tympanic membranes normal  Oropharynx: Pink, supple. No suspicious lesions  Neck: Supple without thyromegaly, adenopathy  Lungs: Respirations unlabored; clear to auscultation bilaterally without wheeze, rales, rhonchi  CVS exam: normal rate and regular rhythm.  Neurologic: Alert and oriented; speech intact; face symmetrical; moves all extremities well; CNII-XII intact without focal deficit   Assessment:  1. Iron deficiency anemia, unspecified iron deficiency anemia type   2. Mixed hyperlipidemia   3. Chronic low back pain without sciatica, unspecified back pain laterality   4. Gastroesophageal reflux disease, unspecified whether esophagitis present     Plan:  Update labs today based on appearance of fingernails; keep planned follow up with hematology for next month; Refill updated; Refill updated on Tramadol; Refill updated on Protonix;   This visit occurred during the SARS-CoV-2 public health emergency.  Safety protocols were in place, including screening questions prior to the visit, additional usage of staff PPE, and extensive cleaning of exam room while observing  appropriate contact time as indicated for disinfecting solutions.    No follow-ups on file.  Orders Placed This Encounter  Procedures   CBC with Differential/Platelet   Iron, TIBC and Ferritin Panel    Requested Prescriptions   Signed Prescriptions Disp Refills   ezetimibe (ZETIA) 10 MG tablet 90 tablet 3    Sig: Take 1 tablet (10 mg total) by mouth daily.   iron polysaccharides (NIFEREX) 150 MG capsule 90 capsule 3    Sig: Take 1 capsule (150 mg total) by mouth daily.   rosuvastatin (CRESTOR) 40 MG tablet 90 tablet 3    Sig: Take 1 tablet (40 mg total) by mouth daily.   traMADol (ULTRAM) 50 MG tablet 30 tablet 1    Sig: Take 1 tablet (50 mg total) by mouth  every 6 (six) hours as needed.

## 2021-04-03 ENCOUNTER — Other Ambulatory Visit: Payer: Self-pay | Admitting: Endocrinology

## 2021-04-10 ENCOUNTER — Telehealth: Payer: Self-pay | Admitting: Cardiovascular Disease

## 2021-04-10 MED ORDER — FENOFIBRATE 54 MG PO TABS: ORAL_TABLET | ORAL | 3 refills | Status: DC

## 2021-04-10 MED ORDER — CLOPIDOGREL BISULFATE 75 MG PO TABS: 75.0000 mg | ORAL_TABLET | Freq: Every day | ORAL | 3 refills | Status: DC

## 2021-04-10 MED ORDER — METOPROLOL SUCCINATE ER 25 MG PO TB24: 25.0000 mg | ORAL_TABLET | Freq: Every day | ORAL | 3 refills | Status: DC

## 2021-04-10 NOTE — Telephone Encounter (Signed)
*  STAT* If patient is at the pharmacy, call can be transferred to refill team.     1. Which medications need to be refilled? (please list name of each medication and dose if known)  amLODipine (NORVASC) 2.5 MG tablet clopidogrel (PLAVIX) 75 MG tablet fenofibrate 54 MG tablet metoprolol succinate (TOPROL-XL) 25 MG 24 hr tablet   2. Which pharmacy/location (including street and city if local pharmacy) is medication to be sent to? [place], [place]   3. Do they need a 30 day or 90 day supply? 15  Patient needs short term supply to hold her over until the request to get it from the mail order service

## 2021-04-10 NOTE — Telephone Encounter (Signed)
°*  STAT* If patient is at the pharmacy, call can be transferred to refill team.   1. Which medications need to be refilled? (please list name of each medication and dose if known)  amLODipine (NORVASC) 2.5 MG tablet clopidogrel (PLAVIX) 75 MG tablet fenofibrate 54 MG tablet metoprolol succinate (TOPROL-XL) 25 MG 24 hr tablet  2. Which pharmacy/location (including street and city if local pharmacy) is medication to be sent to? [facility] [place], [place]  3. Do they need a 30 day or 90 day supply? 90 with refills  Daughter would like these meds sent to the mail order pharmacy so that the patient does not have to drive to the pharmacy

## 2021-04-17 ENCOUNTER — Telehealth: Payer: Self-pay | Admitting: Cardiovascular Disease

## 2021-04-17 MED ORDER — AMLODIPINE BESYLATE 2.5 MG PO TABS: 2.5000 mg | ORAL_TABLET | Freq: Every day | ORAL | 3 refills | Status: DC

## 2021-04-17 NOTE — Telephone Encounter (Signed)
Spoke with the patient and advised that [facility].

## 2021-04-17 NOTE — Telephone Encounter (Signed)
Pt c/o medication issue:  1. Name of Medication: amLODipine (NORVASC) 2.5 MG tablet  2. How are you currently taking this medication (dosage and times per day)? 1 tablet daily  3. Are you having a reaction (difficulty breathing--STAT)? no  4. What is your medication issue? Patient states the medication was rejected when she requested a refill and wants to know if she is supposed to keep taking it.

## 2021-04-22 ENCOUNTER — Telehealth: Payer: Self-pay | Admitting: Internal Medicine

## 2021-04-22 NOTE — Telephone Encounter (Signed)
Rescheduled 03/15 appointment to 03/23 per provider on-call. Patient has been called and voicemail was left. ?

## 2021-04-23 ENCOUNTER — Other Ambulatory Visit: Payer: Self-pay

## 2021-04-24 MED ORDER — AMLODIPINE BESYLATE 2.5 MG PO TABS: 2.5000 mg | ORAL_TABLET | Freq: Every day | ORAL | 2 refills | Status: DC

## 2021-04-24 MED ORDER — AMLODIPINE BESYLATE 2.5 MG PO TABS: 2.5000 mg | ORAL_TABLET | Freq: Every day | ORAL | 3 refills | Status: DC

## 2021-04-24 NOTE — Addendum Note (Signed)
Addended by: Venetia Maxon on: 04/24/2021 02:11 PM ? ? Modules accepted: Orders ? ?

## 2021-04-24 NOTE — Telephone Encounter (Signed)
?*  STAT* If patient is at the pharmacy, call can be transferred to refill team. ? ? ?1. Which medications need to be refilled? (please list name of each medication and dose if known)  ?amLODipine (NORVASC) 2.5 MG tablet ? ?2. Which pharmacy/location (including street and city if local pharmacy) is medication to be sent to? ?[id number] - [place], [place] ? ?3. Do they need a 30 day or 90 day supply?  ? ?30 day supply. Patient's daughter states the patient has been completely out of medication since 2/20. [facility] gave her 4 emergency tablets on 2/17, but may not be able to provide anymore going forward. [facility] has been telling the patient they never received the Rx for Norvasc. I called the phone number on file for them and spoke with a representative who confirmed they have not received the Rx? Patient's daughter mentioned they have the patient listed as "Forever Arechiga," so they may not be registering her as the same patient. [facility] representative confirmed the name is listed as "Clara Jarecki" and they have not received Norvasc. Patient's daughter would like to know if a 30 day supply can been sent to patient's local [place] and the 90 day supply + 3 refills to be resent to [place]. ? ?

## 2021-04-24 NOTE — Telephone Encounter (Signed)
Called patient's local pharmacy to get patient a 30 day supply with additional refills until her mail order comes in from [facility]. ?

## 2021-04-27 ENCOUNTER — Other Ambulatory Visit: Payer: Self-pay

## 2021-05-06 ENCOUNTER — Ambulatory Visit: Payer: Medicare Other | Admitting: Internal Medicine

## 2021-05-06 ENCOUNTER — Other Ambulatory Visit: Payer: Medicare Other

## 2021-05-12 ENCOUNTER — Other Ambulatory Visit: Payer: Self-pay

## 2021-05-12 DIAGNOSIS — D508 Other iron deficiency anemias: Secondary | ICD-10-CM

## 2021-05-14 ENCOUNTER — Other Ambulatory Visit: Payer: Self-pay

## 2021-05-14 ENCOUNTER — Ambulatory Visit: Payer: Medicare Other | Admitting: Internal Medicine

## 2021-05-14 ENCOUNTER — Encounter: Payer: Self-pay | Admitting: Internal Medicine

## 2021-05-14 ENCOUNTER — Encounter: Payer: Self-pay | Admitting: Physician Assistant

## 2021-05-14 ENCOUNTER — Inpatient Hospital Stay: Payer: Medicare Other | Attending: Internal Medicine

## 2021-05-14 ENCOUNTER — Inpatient Hospital Stay (HOSPITAL_BASED_OUTPATIENT_CLINIC_OR_DEPARTMENT_OTHER): Payer: Medicare Other | Admitting: Internal Medicine

## 2021-05-14 ENCOUNTER — Other Ambulatory Visit: Payer: Medicare Other

## 2021-05-14 VITALS — BP 119/61 | HR 93 | Temp 98.1°F | Resp 19 | Ht 63.0 in | Wt 142.9 lb

## 2021-05-14 DIAGNOSIS — I10 Essential (primary) hypertension: Secondary | ICD-10-CM | POA: Insufficient documentation

## 2021-05-14 DIAGNOSIS — Z79899 Other long term (current) drug therapy: Secondary | ICD-10-CM | POA: Insufficient documentation

## 2021-05-14 DIAGNOSIS — D509 Iron deficiency anemia, unspecified: Secondary | ICD-10-CM | POA: Insufficient documentation

## 2021-05-14 DIAGNOSIS — Z91030 Bee allergy status: Secondary | ICD-10-CM | POA: Insufficient documentation

## 2021-05-14 DIAGNOSIS — Z885 Allergy status to narcotic agent status: Secondary | ICD-10-CM | POA: Diagnosis not present

## 2021-05-14 DIAGNOSIS — E538 Deficiency of other specified B group vitamins: Secondary | ICD-10-CM | POA: Insufficient documentation

## 2021-05-14 DIAGNOSIS — I2510 Atherosclerotic heart disease of native coronary artery without angina pectoris: Secondary | ICD-10-CM | POA: Diagnosis not present

## 2021-05-14 DIAGNOSIS — Z9049 Acquired absence of other specified parts of digestive tract: Secondary | ICD-10-CM | POA: Insufficient documentation

## 2021-05-14 DIAGNOSIS — D508 Other iron deficiency anemias: Secondary | ICD-10-CM

## 2021-05-14 DIAGNOSIS — Z85820 Personal history of malignant melanoma of skin: Secondary | ICD-10-CM | POA: Diagnosis not present

## 2021-05-14 LAB — IRON AND IRON BINDING CAPACITY (CC-WL,HP ONLY)
Iron: 70 ug/dL (ref 28–170)
Saturation Ratios: 16 % (ref 10.4–31.8)
TIBC: 437 ug/dL (ref 250–450)
UIBC: 367 ug/dL (ref 148–442)

## 2021-05-14 LAB — CBC WITH DIFFERENTIAL (CANCER CENTER ONLY)
Abs Immature Granulocytes: 0.01 K/uL (ref 0.00–0.07)
Basophils Absolute: 0 K/uL (ref 0.0–0.1)
Basophils Relative: 0 %
Eosinophils Absolute: 0.3 K/uL (ref 0.0–0.5)
Eosinophils Relative: 4 %
HCT: 40.3 % (ref 36.0–46.0)
Hemoglobin: 13.3 g/dL (ref 12.0–15.0)
Immature Granulocytes: 0 %
Lymphocytes Relative: 39 %
Lymphs Abs: 2.8 K/uL (ref 0.7–4.0)
MCH: 29 pg (ref 26.0–34.0)
MCHC: 33 g/dL (ref 30.0–36.0)
MCV: 88 fL (ref 80.0–100.0)
Monocytes Absolute: 0.6 K/uL (ref 0.1–1.0)
Monocytes Relative: 8 %
Neutro Abs: 3.5 K/uL (ref 1.7–7.7)
Neutrophils Relative %: 49 %
Platelet Count: 262 K/uL (ref 150–400)
RBC: 4.58 MIL/uL (ref 3.87–5.11)
RDW: 13.2 % (ref 11.5–15.5)
WBC Count: 7.1 K/uL (ref 4.0–10.5)
nRBC: 0 % (ref 0.0–0.2)

## 2021-05-14 LAB — FERRITIN: Ferritin: 45 ng/mL (ref 11–307)

## 2021-05-14 NOTE — Progress Notes (Signed)
?    [facility] ?Telephone:([phone]) [phone]   [phone] ? ?OFFICE PROGRESS NOTE ? ?[name], [address] ?[address] ?[place] [address] ? ?DIAGNOSIS: Microcytic anemia secondary to iron deficiency. ? ?PRIOR THERAPY: Iron infusion with Venofer 300 Mg IV x3 doses.  Last dose was given 07/09/2020. ? ?CURRENT THERAPY: Niferex 150 mg p.o. daily. ? ?INTERVAL HISTORY: ?Barbara Maxwell 86 y.o. female returns to the clinic today for follow-up visit.  The patient is feeling fine today with no concerning complaints.  She denied having any current chest pain, shortness of breath, cough or hemoptysis.  She has no weight loss or night sweats.  She has no bleeding, bruises or ecchymosis.  She has no nausea, vomiting, diarrhea or constipation.  She continues to tolerate her treatment with Keflex fairly well.  She is here today for evaluation and repeat blood work. ? ?MEDICAL HISTORY: ?Past Medical History:  ?Diagnosis Date  ? Anemia   ? B12 deficiency   ? Cancer [facility])   ? SMALL PLACE-  MELANOMA REMOVED FROM BACK   ? Coronary artery disease   ? Depression   ? Hypercholesterolemia   ? Hypertension   ? ? ?ALLERGIES:  is allergic to bee venom, simvastatin, codeine, evista [raloxifene], and percocet [oxycodone-acetaminophen]. ? ?MEDICATIONS:  ?Current Outpatient Medications  ?Medication Sig Dispense Refill  ? acetaminophen (TYLENOL) 325 MG tablet Take 325 mg by mouth every 6 (six) hours as needed for moderate pain.     ? amLODipine (NORVASC) 2.5 MG tablet Take 1 tablet (2.5 mg total) by mouth daily. 90 tablet 3  ? amLODipine (NORVASC) 2.5 MG tablet Take 1 tablet (2.5 mg total) by mouth daily. 90 tablet 3  ? Calcium Carbonate-Vitamin D (CALCIUM + D PO) Take 1 tablet by mouth daily.    ? Carboxymethylcellulose Sodium (REFRESH TEARS OP) Place 1 drop into both eyes daily.    ? clopidogrel (PLAVIX) 75 MG tablet Take 1 tablet (75 mg total) by mouth daily with breakfast. 90 tablet 3  ?  ezetimibe (ZETIA) 10 MG tablet Take 1 tablet (10 mg total) by mouth daily. 90 tablet 3  ? fenofibrate 54 MG tablet TAKE 1 TABLET(54 MG) BY MOUTH DAILY 90 tablet 3  ? iron polysaccharides (NIFEREX) 150 MG capsule Take 1 capsule (150 mg total) by mouth daily. 90 capsule 3  ? metoprolol succinate (TOPROL-XL) 25 MG 24 hr tablet Take 1 tablet (25 mg total) by mouth daily. 90 tablet 3  ? nitroGLYCERIN (NITROSTAT) 0.4 MG SL tablet Place 1 tablet (0.4 mg total) under the tongue every 5 (five) minutes as needed for chest pain. 25 tablet 5  ? pantoprazole (PROTONIX) 40 MG tablet TAKE 1 TABLET(40 MG) BY MOUTH TWICE DAILY BEFORE A MEAL 180 tablet 2  ? rosuvastatin (CRESTOR) 40 MG tablet Take 1 tablet (40 mg total) by mouth daily. 90 tablet 3  ? sertraline (ZOLOFT) 50 MG tablet TAKE 1 TABLET DAILY 90 tablet 1  ? sodium chloride (OCEAN) 0.65 % SOLN nasal spray Place 1 spray into both nostrils daily.    ? traMADol (ULTRAM) 50 MG tablet Take 1 tablet (50 mg total) by mouth every 6 (six) hours as needed. 30 tablet 1  ? ?No current facility-administered medications for this visit.  ? ? ?SURGICAL HISTORY:  ?Past Surgical History:  ?Procedure Laterality Date  ? ABDOMINAL HYSTERECTOMY    ? VAGINAL HYSTERECTOMY WITH OVARIAN PRESERVATION   ? ABDOMINOPLASTY  1998  ? APPENDECTOMY    ?  BREAST SURGERY  1985  ? BREAST REDUCTION   ? CARDIAC CATHETERIZATION  11/19/2019  ? CORONARY BALLOON ANGIOPLASTY N/A 11/19/2019  ? Procedure: CORONARY BALLOON ANGIOPLASTY;  Surgeon: Lorretta Harp, MD;  Location: [facility];  Service: Cardiovascular;  Laterality: N/A;  ? LEFT HEART CATH AND CORONARY ANGIOGRAPHY N/A 11/19/2019  ? Procedure: LEFT HEART CATH AND CORONARY ANGIOGRAPHY;  Surgeon: Lorretta Harp, MD;  Location: [facility];  Service: Cardiovascular;  Laterality: N/A;  ? TONSILLECTOMY AND ADENOIDECTOMY    ? ? ?REVIEW OF SYSTEMS:  A comprehensive review of systems was negative.  ? ?PHYSICAL EXAMINATION: General appearance: alert,  cooperative, and no distress ?Head: Normocephalic, without obvious abnormality, atraumatic ?Neck: no adenopathy, no JVD, supple, symmetrical, trachea midline, and thyroid not enlarged, symmetric, no tenderness/mass/nodules ?Lymph nodes: Cervical, supraclavicular, and axillary nodes normal. ?Resp: clear to auscultation bilaterally ?Back: symmetric, no curvature. ROM normal. No CVA tenderness. ?Cardio: regular rate and rhythm, S1, S2 normal, no murmur, click, rub or gallop ?GI: soft, non-tender; bowel sounds normal; no masses,  no organomegaly ?Extremities: extremities normal, atraumatic, no cyanosis or edema ? ?ECOG PERFORMANCE STATUS: 1 - Symptomatic but completely ambulatory ? ?Blood pressure 119/61, pulse 93, temperature 98.1 ?F (36.7 ?C), temperature source Tympanic, resp. rate 19, height '5\' 3"'$  (1.6 m), weight 142 lb 14.4 oz (64.8 kg), SpO2 99 %. ? ?LABORATORY DATA: ?Lab Results  ?Component Value Date  ? WBC 7.1 05/14/2021  ? HGB 13.3 05/14/2021  ? HCT 40.3 05/14/2021  ? MCV 88.0 05/14/2021  ? PLT 262 05/14/2021  ? ? ?  Chemistry   ?   ?Component Value Date/Time  ? NA 143 06/02/2020 1251  ? NA 141 11/16/2019 1151  ? K 3.7 06/02/2020 1251  ? CL 110 06/02/2020 1251  ? CO2 22 06/02/2020 1251  ? BUN 15 06/02/2020 1251  ? BUN 13 11/16/2019 1151  ? CREATININE 1.01 (H) 06/02/2020 1251  ? CREATININE 0.77 09/07/2019 0956  ?    ?Component Value Date/Time  ? CALCIUM 9.1 06/02/2020 1251  ? ALKPHOS 52 01/26/2021 1023  ? AST 25 01/26/2021 1023  ? AST 21 06/02/2020 1251  ? ALT 16 01/26/2021 1023  ? ALT 12 06/02/2020 1251  ? BILITOT 0.8 01/26/2021 1023  ? BILITOT 0.8 06/02/2020 1251  ?  ? ? ? ?RADIOGRAPHIC STUDIES: ?No results found. ? ?ASSESSMENT AND PLAN: This is a very pleasant 86 years old white female with microcytic anemia secondary to iron deficiency likely from gastrointestinal blood loss.  The patient also has mild vitamin B12 deficiency. ?She was treated with iron infusion with Venofer 300 mg IV weekly for 3 weeks and  tolerated it fairly well. ?She is currently on oral iron tablet with Niferex 150 mg p.o. daily and tolerating it fairly well.   ?Repeat CBC today showed normal hemoglobin of 13.3 and hematocrit 40.3%.  Iron studies showed normal serum iron of 71 with iron saturation of 16%.  Ferritin level is still pending. ?I recommended for the patient to continue with the oral iron tablet with Niferex 150 mg p.o. daily. ?I recommended for the patient to follow-up with her primary care physician from now on and I will be happy to see her in the future if she has any concerning issues or worsening anemia. ?The patient voices understanding of current disease status and treatment options and is in agreement with the current care plan. ? ?All questions were answered. The patient knows to call the clinic with any problems,  questions or concerns. We can certainly see the patient much sooner if necessary. ? ? ?Disclaimer: This note was dictated with voice recognition software. Similar sounding words can inadvertently be transcribed and may not be corrected upon review. ? ? ?  ?   ?

## 2021-05-21 ENCOUNTER — Telehealth: Payer: Self-pay | Admitting: Family

## 2021-05-21 NOTE — Telephone Encounter (Signed)
Left message for patient to call back and schedule Medicare Annual Wellness Visit (AWV) in office.  ? ?If not able to come in office, please offer to do virtually or by telephone.  Left office number and my jabber [phone]. ? ?Last AWV:06/21/2019 ? ?Please schedule at anytime with Nurse Health Advisor. ?  ?

## 2021-05-26 ENCOUNTER — Ambulatory Visit (INDEPENDENT_AMBULATORY_CARE_PROVIDER_SITE_OTHER): Payer: Medicare Other

## 2021-05-26 DIAGNOSIS — Z0000 Encounter for general adult medical examination without abnormal findings: Secondary | ICD-10-CM

## 2021-05-26 NOTE — Progress Notes (Addendum)
Virtual Visit via Telephone Note ? ?I connected with  Barbara Maxwell on 05/26/21 at  3:00 PM EDT by telephone and verified that I am speaking with the correct person using two identifiers. ? ?Medicare Annual Wellness visit completed telephonically due to Covid-19 pandemic.  ? ?Persons participating in this call: This Health Coach and this patient.  ? ?Location: ?Patient: home ?Provider: office ?  ?I discussed the limitations, risks, security and privacy concerns of performing an evaluation and management service by telephone and the availability of in person appointments. The patient expressed understanding and agreed to proceed. ? ?Unable to perform video visit due to video visit attempted and failed and/or patient does not have video capability.  ? ?Some vital signs may be absent or patient reported.  ? ?Willette Brace, LPN ? ? ?Subjective:  ? Barbara Maxwell is a 86 y.o. female who presents for Medicare Annual (Subsequent) preventive examination. ? ?Review of Systems    ? ?Cardiac Risk Factors include: advanced age (>44mn, >>61women);dyslipidemia;hypertension ? ?   ?Objective:  ?  ?There were no vitals filed for this visit. ?There is no height or weight on file to calculate BMI. ? ? ?  05/26/2021  ?  2:59 PM 05/14/2021  ?  2:50 PM 08/04/2020  ?  1:37 PM 07/02/2020  ?  8:32 AM 06/19/2020  ?  9:10 AM 06/02/2020  ?  1:23 PM 11/19/2019  ?  5:43 PM  ?Advanced Directives  ?Does Patient Have a Medical Advance Directive? Yes Yes Yes Yes Yes Yes Yes  ?Type of [place]amed[place] will [place] will Living will Living will Living will Living will  ?Does patient want to make changes to medical advance directive?  No - Patient declined No - Patient declined No - Patient declined  No - Patient declined No - Patient declined  ?Copy of [place]exterin Chart? Yes - validated most recent copy scanned in chart (See row information) Yes  - validated most recent copy scanned in chart (See row information)       ? ? ?Current Medications (verified) ?Outpatient Encounter Medications as of 05/26/2021  ?Medication Sig  ? acetaminophen (TYLENOL) 325 MG tablet Take 325 mg by mouth every 6 (six) hours as needed for moderate pain.   ? amLODipine (NORVASC) 2.5 MG tablet Take 1 tablet (2.5 mg total) by mouth daily.  ? Calcium Carbonate-Vitamin D (CALCIUM + D PO) Take 1 tablet by mouth daily.  ? Carboxymethylcellulose Sodium (REFRESH TEARS OP) Place 1 drop into both eyes daily.  ? clopidogrel (PLAVIX) 75 MG tablet Take 1 tablet (75 mg total) by mouth daily with breakfast.  ? ezetimibe (ZETIA) 10 MG tablet Take 1 tablet (10 mg total) by mouth daily.  ? fenofibrate 54 MG tablet TAKE 1 TABLET(54 MG) BY MOUTH DAILY  ? iron polysaccharides (NIFEREX) 150 MG capsule Take 1 capsule (150 mg total) by mouth daily.  ? metoprolol succinate (TOPROL-XL) 25 MG 24 hr tablet Take 1 tablet (25 mg total) by mouth daily.  ? nitroGLYCERIN (NITROSTAT) 0.4 MG SL tablet Place 1 tablet (0.4 mg total) under the tongue every 5 (five) minutes as needed for chest pain.  ? pantoprazole (PROTONIX) 40 MG tablet TAKE 1 TABLET(40 MG) BY MOUTH TWICE DAILY BEFORE A MEAL  ? rosuvastatin (CRESTOR) 40 MG tablet Take 1 tablet (40 mg total) by mouth daily.  ? sertraline (ZOLOFT) 50 MG tablet TAKE 1  TABLET DAILY  ? sodium chloride (OCEAN) 0.65 % SOLN nasal spray Place 1 spray into both nostrils daily.  ? traMADol (ULTRAM) 50 MG tablet Take 1 tablet (50 mg total) by mouth every 6 (six) hours as needed.  ? ?No facility-administered encounter medications on file as of 05/26/2021.  ? ? ?Allergies (verified) ?Bee venom, Simvastatin, Codeine, Evista [raloxifene], and Percocet [oxycodone-acetaminophen]  ? ?History: ?Past Medical History:  ?Diagnosis Date  ? Anemia   ? B12 deficiency   ? Cancer [facility])   ? SMALL PLACE-  MELANOMA REMOVED FROM BACK   ? Coronary artery disease   ? Depression   ? Hypercholesterolemia   ?  Hypertension   ? ?Past Surgical History:  ?Procedure Laterality Date  ? ABDOMINAL HYSTERECTOMY    ? VAGINAL HYSTERECTOMY WITH OVARIAN PRESERVATION   ? ABDOMINOPLASTY  1998  ? APPENDECTOMY    ? BREAST SURGERY  1985  ? BREAST REDUCTION   ? CARDIAC CATHETERIZATION  11/19/2019  ? CORONARY BALLOON ANGIOPLASTY N/A 11/19/2019  ? Procedure: CORONARY BALLOON ANGIOPLASTY;  Surgeon: Lorretta Harp, MD;  Location: [facility];  Service: Cardiovascular;  Laterality: N/A;  ? LEFT HEART CATH AND CORONARY ANGIOGRAPHY N/A 11/19/2019  ? Procedure: LEFT HEART CATH AND CORONARY ANGIOGRAPHY;  Surgeon: Lorretta Harp, MD;  Location: [facility];  Service: Cardiovascular;  Laterality: N/A;  ? TONSILLECTOMY AND ADENOIDECTOMY    ? ?Family History  ?Problem Relation Age of Onset  ? Hypertension Mother   ? Heart disease Father   ? Diabetes Father   ? Cancer Sister 19  ?     OVARIAN   ? Thyroid disease Sister   ? Heart disease Brother   ? Diabetes Paternal Grandmother   ? Thyroid disease Daughter   ? Colon cancer Neg Hx   ? Esophageal cancer Neg Hx   ? Rectal cancer Neg Hx   ? Stomach cancer Neg Hx   ? ?Social History  ? ?Socioeconomic History  ? Marital status: Widowed  ?  Spouse name: Not on file  ? Number of children: 4  ? Years of education: Not on file  ? Highest education level: Not on file  ?Occupational History  ? Occupation: retired  ?Tobacco Use  ? Smoking status: Never  ? Smokeless tobacco: Never  ?Vaping Use  ? Vaping Use: Never used  ?Substance and Sexual Activity  ? Alcohol use: No  ?  Alcohol/week: 0.0 standard drinks  ? Drug use: Not on file  ? Sexual activity: Not Currently  ?  Comment: 1st intercourse- 20, partners- 2, widow  ?Other Topics Concern  ? Not on file  ?Social History Narrative  ? Not on file  ? ?Social Determinants of Health  ? ?Financial Resource Strain: Low Risk   ? Difficulty of Paying Living Expenses: Not hard at all  ?Food Insecurity: No Food Insecurity  ? Worried About Charity fundraiser  in the Last Year: Never true  ? Ran Out of Food in the Last Year: Never true  ?Transportation Needs: No Transportation Needs  ? Lack of Transportation (Medical): No  ? Lack of Transportation (Non-Medical): No  ?Physical Activity: Insufficiently Active  ? Days of Exercise per Week: 2 days  ? Minutes of Exercise per Session: 60 min  ?Stress: No Stress Concern Present  ? Feeling of Stress : Not at all  ?Social Connections: Moderately Isolated  ? Frequency of Communication with Friends and Family: More than three times a week  ?  Frequency of Social Gatherings with Friends and Family: Twice a week  ? Attends Religious Services: More than 4 times per year  ? Active Member of Clubs or Organizations: No  ? Attends Archivist Meetings: Never  ? Marital Status: Widowed  ? ? ?Tobacco Counseling ?Counseling given: Not Answered ? ? ?Clinical Intake: ? ?Pre-visit preparation completed: Yes ? ?Pain : No/denies pain ? ?  ? ?BMI - recorded: 25.32 ?Nutritional Status: BMI 25 -29 Overweight ?Nutritional Risks: None ?Diabetes: No ? ?How often do you need to have someone help you when you read instructions, pamphlets, or other written materials from your doctor or pharmacy?: 1 - Never ? ?Diabetic?no ? ?Interpreter Needed?: No ? ?Information entered by :: Charlott Rakes, LPN ? ? ?Activities of Daily Living ? ?  05/26/2021  ?  3:00 PM  ?In your present state of health, do you have any difficulty performing the following activities:  ?Hearing? 1  ?Comment wears a hearing aid  ?Vision? 0  ?Difficulty concentrating or making decisions? 0  ?Walking or climbing stairs? 0  ?Dressing or bathing? 0  ?Doing errands, shopping? 0  ?Preparing Food and eating ? N  ?Using the Toilet? N  ?In the past six months, have you accidently leaked urine? N  ?Comment urgency at times  ?Do you have problems with loss of bowel control? N  ?Managing your Medications? N  ?Managing your Finances? N  ?Housekeeping or managing your Housekeeping? N  ? ? ?Patient  Care Team: ?[name], FNP as PCP - General (Internal Medicine) ?Lorretta Harp, MD as PCP - Cardiology (Cardiology) ? ?Indicate any recent Medical Services you may have received from

## 2021-05-26 NOTE — Patient Instructions (Signed)
Ms. Harp , ?Thank you for taking time to come for your Medicare Wellness Visit. I appreciate your ongoing commitment to your health goals. Please review the following plan we discussed and let me know if I can assist you in the future.  ? ?Screening recommendations/referrals: ?Colonoscopy: not a candidate  ?Mammogram: Done 12/16/20 repeat every year  ?Bone Density: Done 02/06/15 repeat every 2 years  ?Recommended yearly ophthalmology/optometry visit for glaucoma screening and checkup ?Recommended yearly dental visit for hygiene and checkup ? ?Vaccinations: ?Influenza vaccine: Done 12/05/20 repeat every year  ?Pneumococcal vaccine: Up to date ?Tdap vaccine: Done 07/17/14 repeat every 10 years  ?Shingles vaccine: Shingrix discussed. Please contact your pharmacy for coverage information.    ?Covid-19:Completed 2/13, 3/7, 12/22/19  ? ?Advanced directives: Copies in chart ? ?Conditions/risks identified: Be as healthy as I can  ? ?Next appointment: Follow up in one year for your annual wellness visit  ? ? ?Preventive Care 73 Years and Older, Female ?Preventive care refers to lifestyle choices and visits with your health care provider that can promote health and wellness. ?What does preventive care include? ?A yearly physical exam. This is also called an annual well check. ?Dental exams once or twice a year. ?Routine eye exams. Ask your health care provider how often you should have your eyes checked. ?Personal lifestyle choices, including: ?Daily care of your teeth and gums. ?Regular physical activity. ?Eating a healthy diet. ?Avoiding tobacco and drug use. ?Limiting alcohol use. ?Practicing safe sex. ?Taking low-dose aspirin every day. ?Taking vitamin and mineral supplements as recommended by your health care provider. ?What happens during an annual well check? ?The services and screenings done by your health care provider during your annual well check will depend on your age, overall health, lifestyle risk factors, and  family history of disease. ?Counseling  ?Your health care provider may ask you questions about your: ?Alcohol use. ?Tobacco use. ?Drug use. ?Emotional well-being. ?Home and relationship well-being. ?Sexual activity. ?Eating habits. ?History of falls. ?Memory and ability to understand (cognition). ?Work and work Statistician. ?Reproductive health. ?Screening  ?You may have the following tests or measurements: ?Height, weight, and BMI. ?Blood pressure. ?Lipid and cholesterol levels. These may be checked every 5 years, or more frequently if you are over 44 years old. ?Skin check. ?Lung cancer screening. You may have this screening every year starting at age 78 if you have a 30-pack-year history of smoking and currently smoke or have quit within the past 15 years. ?Fecal occult blood test (FOBT) of the stool. You may have this test every year starting at age 33. ?Flexible sigmoidoscopy or colonoscopy. You may have a sigmoidoscopy every 5 years or a colonoscopy every 10 years starting at age 47. ?Hepatitis C blood test. ?Hepatitis B blood test. ?Sexually transmitted disease (STD) testing. ?Diabetes screening. This is done by checking your blood sugar (glucose) after you have not eaten for a while (fasting). You may have this done every 1-3 years. ?Bone density scan. This is done to screen for osteoporosis. You may have this done starting at age 53. ?Mammogram. This may be done every 1-2 years. Talk to your health care provider about how often you should have regular mammograms. ?Talk with your health care provider about your test results, treatment options, and if necessary, the need for more tests. ?Vaccines  ?Your health care provider may recommend certain vaccines, such as: ?Influenza vaccine. This is recommended every year. ?Tetanus, diphtheria, and acellular pertussis (Tdap, Td) vaccine. You may need a Td  booster every 10 years. ?Zoster vaccine. You may need this after age 9. ?Pneumococcal 13-valent conjugate  (PCV13) vaccine. One dose is recommended after age 35. ?Pneumococcal polysaccharide (PPSV23) vaccine. One dose is recommended after age 45. ?Talk to your health care provider about which screenings and vaccines you need and how often you need them. ?This information is not intended to replace advice given to you by your health care provider. Make sure you discuss any questions you have with your health care provider. ?Document Released: 03/07/2015 Document Revised: 10/29/2015 Document Reviewed: 12/10/2014 ?Elsevier Interactive Patient Education ? 2017 [employer]. ? ?Fall Prevention in the Home ?Falls can cause injuries. They can happen to people of all ages. There are many things you can do to make your home safe and to help prevent falls. ?What can I do on the outside of my home? ?Regularly fix the edges of walkways and driveways and fix any cracks. ?Remove anything that might make you trip as you walk through a door, such as a raised step or threshold. ?Trim any bushes or trees on the path to your home. ?Use bright outdoor lighting. ?Clear any walking paths of anything that might make someone trip, such as rocks or tools. ?Regularly check to see if handrails are loose or broken. Make sure that both sides of any steps have handrails. ?Any raised decks and porches should have guardrails on the edges. ?Have any leaves, snow, or ice cleared regularly. ?Use sand or salt on walking paths during winter. ?Clean up any spills in your garage right away. This includes oil or grease spills. ?What can I do in the bathroom? ?Use night lights. ?Install grab bars by the toilet and in the tub and shower. Do not use towel bars as grab bars. ?Use non-skid mats or decals in the tub or shower. ?If you need to sit down in the shower, use a plastic, non-slip stool. ?Keep the floor dry. Clean up any water that spills on the floor as soon as it happens. ?Remove soap buildup in the tub or shower regularly. ?Attach bath mats securely with  double-sided non-slip rug tape. ?Do not have throw rugs and other things on the floor that can make you trip. ?What can I do in the bedroom? ?Use night lights. ?Make sure that you have a light by your bed that is easy to reach. ?Do not use any sheets or blankets that are too big for your bed. They should not hang down onto the floor. ?Have a firm chair that has side arms. You can use this for support while you get dressed. ?Do not have throw rugs and other things on the floor that can make you trip. ?What can I do in the kitchen? ?Clean up any spills right away. ?Avoid walking on wet floors. ?Keep items that you use a lot in easy-to-reach places. ?If you need to reach something above you, use a strong step stool that has a grab bar. ?Keep electrical cords out of the way. ?Do not use floor polish or wax that makes floors slippery. If you must use wax, use non-skid floor wax. ?Do not have throw rugs and other things on the floor that can make you trip. ?What can I do with my stairs? ?Do not leave any items on the stairs. ?Make sure that there are handrails on both sides of the stairs and use them. Fix handrails that are broken or loose. Make sure that handrails are as long as the stairways. ?Check any carpeting  to make sure that it is firmly attached to the stairs. Fix any carpet that is loose or worn. ?Avoid having throw rugs at the top or bottom of the stairs. If you do have throw rugs, attach them to the floor with carpet tape. ?Make sure that you have a light switch at the top of the stairs and the bottom of the stairs. If you do not have them, ask someone to add them for you. ?What else can I do to help prevent falls? ?Wear shoes that: ?Do not have high heels. ?Have rubber bottoms. ?Are comfortable and fit you well. ?Are closed at the toe. Do not wear sandals. ?If you use a stepladder: ?Make sure that it is fully opened. Do not climb a closed stepladder. ?Make sure that both sides of the stepladder are locked  into place. ?Ask someone to hold it for you, if possible. ?Clearly mark and make sure that you can see: ?Any grab bars or handrails. ?First and last steps. ?Where the edge of each step is. ?Use tools that he

## 2021-06-10 DIAGNOSIS — L821 Other seborrheic keratosis: Secondary | ICD-10-CM | POA: Diagnosis not present

## 2021-06-10 DIAGNOSIS — L814 Other melanin hyperpigmentation: Secondary | ICD-10-CM | POA: Diagnosis not present

## 2021-06-10 DIAGNOSIS — L570 Actinic keratosis: Secondary | ICD-10-CM | POA: Diagnosis not present

## 2021-06-10 DIAGNOSIS — D225 Melanocytic nevi of trunk: Secondary | ICD-10-CM | POA: Diagnosis not present

## 2021-06-10 DIAGNOSIS — B009 Herpesviral infection, unspecified: Secondary | ICD-10-CM | POA: Diagnosis not present

## 2021-06-10 DIAGNOSIS — Z85820 Personal history of malignant melanoma of skin: Secondary | ICD-10-CM | POA: Diagnosis not present

## 2021-07-16 ENCOUNTER — Other Ambulatory Visit: Payer: Self-pay | Admitting: Cardiovascular Disease

## 2021-08-07 ENCOUNTER — Ambulatory Visit: Payer: Medicare Other | Admitting: Family

## 2021-08-11 ENCOUNTER — Other Ambulatory Visit: Payer: Self-pay | Admitting: *Deleted

## 2021-08-11 MED ORDER — EZETIMIBE 10 MG PO TABS: 10.0000 mg | ORAL_TABLET | Freq: Every day | ORAL | 3 refills | Status: DC

## 2021-08-11 MED ORDER — ROSUVASTATIN CALCIUM 40 MG PO TABS: 40.0000 mg | ORAL_TABLET | Freq: Every day | ORAL | 3 refills | Status: DC

## 2021-08-11 NOTE — Addendum Note (Signed)
Addended by: Kem Boroughs D on: 08/11/2021 11:52 AM   Modules accepted: Orders

## 2021-08-12 ENCOUNTER — Other Ambulatory Visit: Payer: Self-pay | Admitting: *Deleted

## 2021-08-12 MED ORDER — EZETIMIBE 10 MG PO TABS: 10.0000 mg | ORAL_TABLET | Freq: Every day | ORAL | 3 refills | Status: DC

## 2021-08-13 ENCOUNTER — Ambulatory Visit: Payer: Medicare Other | Admitting: Family

## 2021-08-14 ENCOUNTER — Other Ambulatory Visit: Payer: Self-pay | Admitting: Cardiovascular Disease

## 2021-08-20 ENCOUNTER — Encounter: Payer: Self-pay | Admitting: Physician Assistant

## 2021-08-21 ENCOUNTER — Ambulatory Visit (INDEPENDENT_AMBULATORY_CARE_PROVIDER_SITE_OTHER): Payer: Medicare Other | Admitting: Family

## 2021-08-21 VITALS — BP 118/65 | HR 58 | Temp 98.2°F | Resp 16 | Ht 63.0 in | Wt 144.0 lb

## 2021-08-21 DIAGNOSIS — E2839 Other primary ovarian failure: Secondary | ICD-10-CM

## 2021-08-21 DIAGNOSIS — F419 Anxiety disorder, unspecified: Secondary | ICD-10-CM

## 2021-08-21 DIAGNOSIS — R300 Dysuria: Secondary | ICD-10-CM | POA: Diagnosis not present

## 2021-08-21 DIAGNOSIS — F32A Depression, unspecified: Secondary | ICD-10-CM | POA: Diagnosis not present

## 2021-08-21 DIAGNOSIS — M8580 Other specified disorders of bone density and structure, unspecified site: Secondary | ICD-10-CM | POA: Diagnosis not present

## 2021-08-21 LAB — URINALYSIS, ROUTINE W REFLEX MICROSCOPIC
Bilirubin Urine: NEGATIVE
Ketones, ur: NEGATIVE
Nitrite: NEGATIVE
Specific Gravity, Urine: 1.025 (ref 1.000–1.030)
Total Protein, Urine: NEGATIVE
Urine Glucose: NEGATIVE
Urobilinogen, UA: 0.2 (ref 0.0–1.0)
pH: 5.5 (ref 5.0–8.0)

## 2021-08-21 MED ORDER — SERTRALINE HCL 50 MG PO TABS
50.0000 mg | ORAL_TABLET | Freq: Every day | ORAL | 3 refills | Status: DC
Start: 2021-08-21 — End: 2022-08-18

## 2021-08-21 NOTE — Progress Notes (Signed)
Barbara Maxwell is a 86 y.o. female with the following history as recorded in EpicCare:  Patient Active Problem List   Diagnosis Date Noted   Chronic low back pain without sciatica 11/07/2020   Antiplatelet or antithrombotic long-term use 06/13/2020   Iron deficiency anemia 06/02/2020   CAD (coronary artery disease) 11/19/2019   Atypical chest pain 09/28/2019   Dyspnea on exertion 09/28/2019   H/O vitamin D deficiency 01/21/2015   Osteopenia 01/21/2015   Vaginal atrophy 01/21/2015   Mixed hyperlipidemia 02/04/2014   Family history of ovarian cancer 11/13/2012   Rectocele 11/07/2012   Hyperlipidemia 09/29/2012   Essential hypertension 08/07/2009   ALLERGIC RHINITIS 08/07/2009   HYPERSOMNIA WITH SLEEP APNEA UNSPECIFIED 08/04/2009    Current Outpatient Medications  Medication Sig Dispense Refill   acetaminophen (TYLENOL) 325 MG tablet Take 325 mg by mouth every 6 (six) hours as needed for moderate pain.      amLODipine (NORVASC) 2.5 MG tablet TAKE 1 TABLET(2.5 MG) BY MOUTH DAILY 90 tablet 3   Calcium Carbonate-Vitamin D (CALCIUM + D PO) Take 1 tablet by mouth daily.     Carboxymethylcellulose Sodium (REFRESH TEARS OP) Place 1 drop into both eyes daily.     clopidogrel (PLAVIX) 75 MG tablet TAKE 1 TABLET DAILY WITH BREAKFAST 90 tablet 3   ezetimibe (ZETIA) 10 MG tablet Take 1 tablet (10 mg total) by mouth daily. 90 tablet 3   fenofibrate 54 MG tablet TAKE 1 TABLET(54 MG) BY MOUTH DAILY 90 tablet 3   iron polysaccharides (NIFEREX) 150 MG capsule Take 1 capsule (150 mg total) by mouth daily. 90 capsule 3   metoprolol succinate (TOPROL-XL) 25 MG 24 hr tablet TAKE 1 TABLET DAILY 90 tablet 3   nitroGLYCERIN (NITROSTAT) 0.4 MG SL tablet Place 1 tablet (0.4 mg total) under the tongue every 5 (five) minutes as needed for chest pain. 25 tablet 5   pantoprazole (PROTONIX) 40 MG tablet TAKE 1 TABLET(40 MG) BY MOUTH TWICE DAILY BEFORE A MEAL 180 tablet 2   rosuvastatin (CRESTOR) 40 MG tablet  Take 1 tablet (40 mg total) by mouth daily. 90 tablet 3   sodium chloride (OCEAN) 0.65 % SOLN nasal spray Place 1 spray into both nostrils daily.     traMADol (ULTRAM) 50 MG tablet Take 1 tablet (50 mg total) by mouth every 6 (six) hours as needed. 30 tablet 1   sertraline (ZOLOFT) 50 MG tablet Take 1 tablet (50 mg total) by mouth daily. 90 tablet 3   No current facility-administered medications for this visit.    Allergies: Bee venom, Simvastatin, Codeine, Evista [raloxifene], and Percocet [oxycodone-acetaminophen]  Past Medical History:  Diagnosis Date   Anemia    B12 deficiency    Cancer ([place])    SMALL PLACE-  MELANOMA REMOVED FROM BACK    Coronary artery disease    Depression    Hypercholesterolemia    Hypertension     Past Surgical History:  Procedure Laterality Date   ABDOMINAL HYSTERECTOMY     VAGINAL HYSTERECTOMY WITH OVARIAN PRESERVATION    ABDOMINOPLASTY  1998   APPENDECTOMY     BREAST SURGERY  1985   BREAST REDUCTION    CARDIAC CATHETERIZATION  11/19/2019   CORONARY BALLOON ANGIOPLASTY N/A 11/19/2019   Procedure: CORONARY BALLOON ANGIOPLASTY;  Surgeon: Lorretta Harp, MD;  Location: [facility];  Service: Cardiovascular;  Laterality: N/A;   LEFT HEART CATH AND CORONARY ANGIOGRAPHY N/A 11/19/2019   Procedure: LEFT HEART CATH AND CORONARY ANGIOGRAPHY;  Surgeon: Lorretta Harp, MD;  Location: [facility];  Service: Cardiovascular;  Laterality: N/A;   TONSILLECTOMY AND ADENOIDECTOMY      Family History  Problem Relation Age of Onset   Hypertension Mother    Heart disease Father    Diabetes Father    Cancer Sister 65       OVARIAN    Thyroid disease Sister    Heart disease Brother    Diabetes Paternal Grandmother    Thyroid disease Daughter    Colon cancer Neg Hx    Esophageal cancer Neg Hx    Rectal cancer Neg Hx    Stomach cancer Neg Hx     Social History   Tobacco Use   Smoking status: Never   Smokeless tobacco: Never  Substance Use  Topics   Alcohol use: No    Alcohol/week: 0.0 standard drinks of alcohol    Subjective:  Follow up on chronic care needs; no acute concerns; Will be moving into independent living situation;  Continuing to see cardiology regularly; has recently been released from hematology; labs up to date; Mammogram due in October; overdue for DEXA;   Objective:  Vitals:   08/21/21 1037  BP: 118/65  Pulse: (!) 58  Resp: 16  Temp: 98.2 F (36.8 C)  TempSrc: Oral  SpO2: 98%  Weight: 144 lb (65.3 kg)  Height: '5\' 3"'$  (1.6 m)    General: Well developed, well nourished, in no acute distress  Skin : Warm and dry.  Head: Normocephalic and atraumatic  Eyes: Sclera and conjunctiva clear; pupils round and reactive to light; extraocular movements intact  Ears: External normal; canals clear; tympanic membranes normal  Oropharynx: Pink, supple. No suspicious lesions  Neck: Supple without thyromegaly, adenopathy  Lungs: Respirations unlabored; clear to auscultation bilaterally without wheeze, rales, rhonchi  CVS exam: normal rate and regular rhythm.  Abdomen: Soft; nontender; nondistended; normoactive bowel sounds; no masses or hepatosplenomegaly  Musculoskeletal: No deformities; no active joint inflammation  Extremities: No edema, cyanosis, clubbing  Vessels: Symmetric bilaterally  Neurologic: Alert and oriented; speech intact; face symmetrical; moves all extremities well; CNII-XII intact without focal deficit  Assessment:  1. Ovarian failure   2. Osteopenia, unspecified location   3. Dysuria   4. Anxiety and depression     Plan:  & 2. Order updated for DEXA; 3.   Check U/A and urine culture; 4.   Stable; refill updated;  Follow up in 1 year, sooner prn.   No follow-ups on file.  Orders Placed This Encounter  Procedures   Urine Culture   DG Bone Density    Standing Status:   Future    Standing Expiration Date:   08/22/2022    Scheduling Instructions:     [facility]; please schedule with  mammogram in October 2023    Order Specific Question:   Reason for Exam (SYMPTOM  OR DIAGNOSIS REQUIRED)    Answer:   history of osteopenia    Order Specific Question:   Preferred imaging location?    Answer:   External   Urinalysis   Urinalysis, Routine w reflex microscopic    Requested Prescriptions   Signed Prescriptions Disp Refills   sertraline (ZOLOFT) 50 MG tablet 90 tablet 3    Sig: Take 1 tablet (50 mg total) by mouth daily.

## 2021-08-22 LAB — URINE CULTURE
MICRO NUMBER:: 13594168
SPECIMEN QUALITY:: ADEQUATE

## 2021-08-30 NOTE — Progress Notes (Unsigned)
Cardiology Clinic Note   Patient Name: Barbara Maxwell Date of Encounter: 08/30/2021  Primary Care Provider:  [name], [place] Primary Cardiologist:  Quay Burow, MD  Patient Profile    Barbara Maxwell 86 year old female presents to clinic today for follow-up evaluation of her essential hypertension and coronary artery disease.  Past Medical History    Past Medical History:  Diagnosis Date   Anemia    B12 deficiency    Cancer ([place])    [facility]ACE-  MELANOMA REMOVED FROM BACK    Coronary artery disease    Depression    Hypercholesterolemia    Hypertension    Past Surgical History:  Procedure Laterality Date   ABDOMINAL HYSTERECTOMY     VAGINAL HYSTERECTOMY WITH OVARIAN PRESERVATION    ABDOMINOPLASTY  1998   APPENDECTOMY     BREAST SURGERY  1985   BREAST REDUCTION    CARDIAC CATHETERIZATION  11/19/2019   CORONARY BALLOON ANGIOPLASTY N/A 11/19/2019   Procedure: CORONARY BALLOON ANGIOPLASTY;  Surgeon: Lorretta Harp, MD;  Location: [facility];  Service: Cardiovascular;  Laterality: N/A;   LEFT HEART CATH AND CORONARY ANGIOGRAPHY N/A 11/19/2019   Procedure: LEFT HEART CATH AND CORONARY ANGIOGRAPHY;  Surgeon: Lorretta Harp, MD;  Location: [facility];  Service: Cardiovascular;  Laterality: N/A;   TONSILLECTOMY AND ADENOIDECTOMY      Allergies  Allergies  Allergen Reactions   Bee Venom Anaphylaxis   Simvastatin     Myopathy   Codeine Nausea Only   Evista [Raloxifene]     Hot flashes   Percocet [Oxycodone-Acetaminophen] Nausea And Vomiting    History of Present Illness    Barbara Maxwell has a PMH of essential hypertension, mixed hyperlipidemia, and coronary artery disease.  Her husband of 46 years recently passed away.  She has 3 living children and 6 grandchildren.  She was referred by Dr. Valere Dross for cardiac evaluation due to atypical chest pain and dyspnea.  Her father had bypass surgery and she has a son who had a stent placed.   She has been fairly active and does water aerobics 2 days/week.  She does notice some dyspnea walking up inclines and notices some atypical chest pain.  She reports some anxiety with living alone.  Her echocardiogram 10/15/2019 showed G1 DD.  She had a coronary calcium score of 1681 which suggested proximal to mid RCA disease and LAD disease.  She underwent outpatient diagnostic coronary angiography 11/19/2019 which showed 60% proximal LAD and 99% ostial RCA with grade 2-3 left to right collaterals.  A temporary transvenous pacemaker was placed and orbital arthrectomy PCI and stenting were attempted.  However the lesion was unable to be crossed.  The procedure was aborted and medical management was recommended.  She was last seen by Dr. Gwenlyn Found on 05/30/2020.  During that time she reported that she had been to the emergency department after left arm pain.  She was taking sublingual nitroglycerin and became presyncopal.  She was evaluated and found to be anemic with a hemoglobin of 7.7.  Reasons were unclear.  She received 1 unit of PRBCs which increased her hemoglobin to 9.1.  She was placed on oral iron supplementation.  She was scheduled for colonoscopy.  Her dyspnea with walking up inclines had reduced.  She presented to the clinic 08/29/2020 for follow-up evaluation stated she felt well.  She reported she underwent colonoscopy.  She was found to have some esophageal irritation, hiatal hernia and was  being evaluated for H. pylori.  She also had colonoscopy which showed a polyp and no active bleed.  GI felt that the bleed was related to her esophageal irritation.  She had been receiving iron infusions from the infusion center as well as B12 injections.  She reported that she was able to walk down to her mailbox and back without increasing shortness of breath.  She had changed her diet and was eating more iron rich foods.  She presented with her daughter who felt she was doing much better.  Her daughter is a retired  Counsellor.  I had her continue her current medications, maintain her physical activity and planned follow-up in 6 months.  We planned repeat fasting lipids and LFTs in September.  She was seen in follow-up by Dr. Gwenlyn Found on 03/04/2021.  She continued to do well at that time.  She reported that she needed to take sublingual nitroglycerin 3 times over the previous 8 months.  She continued to do water aerobics 2 times per week and was asymptomatic.  She presents to the clinic today for follow-up evaluation and states***  Today she denies chest pain, shortness of breath, lower extremity edema, fatigue, palpitations, melena, hematuria, hemoptysis, diaphoresis, weakness, presyncope, syncope, orthopnea, and PND.   Home Medications    Prior to Admission medications   Medication Sig Start Date End Date Taking? Authorizing Provider  acetaminophen (TYLENOL) 325 MG tablet Take 325 mg by mouth every 6 (six) hours as needed for moderate pain.     [provider]  amLODipine (NORVASC) 2.5 MG tablet Take 1 tablet (2.5 mg total) by mouth daily. 04/09/20 07/08/20  Lorretta Harp, MD  aspirin EC 81 MG tablet Take 1 tablet (81 mg total) by mouth daily. Swallow whole. 11/20/19 11/19/20  Cheryln Manly, NP  Calcium Carbonate-Vitamin D (CALCIUM + D PO) Take 1 tablet by mouth daily.    [provider]  Carboxymethylcellulose Sodium (REFRESH TEARS OP) Place 1 drop into both eyes daily.    [provider]  clopidogrel (PLAVIX) 75 MG tablet TAKE 1 TABLET DAILY WITH BREAKFAST 08/19/20   Lorretta Harp, MD  ezetimibe (ZETIA) 10 MG tablet TAKE 1 TABLET DAILY 04/08/20   Elayne Snare, MD  ferrous sulfate 325 (65 FE) MG EC tablet Take 325 mg by mouth daily with breakfast.    [provider]  iron polysaccharides (NIFEREX) 150 MG capsule Take 1 capsule (150 mg total) by mouth daily. 05/28/20   [name], FNP  metoprolol succinate (TOPROL-XL) 25 MG 24 hr tablet TAKE 1 TABLET DAILY  08/19/20   Lorretta Harp, MD  neomycin-polymyxin-hydrocortisone (CORTISPORIN) OTIC solution Place 3 drops into the left ear 3 (three) times daily. 04/14/20   [name], FNP  nitroGLYCERIN (NITROSTAT) 0.4 MG SL tablet Place 1 tablet (0.4 mg total) under the tongue every 5 (five) minutes as needed for chest pain. 11/20/19 11/19/20  Cheryln Manly, NP  pantoprazole (PROTONIX) 40 MG tablet TAKE 1 TABLET(40 MG) BY MOUTH TWICE DAILY BEFORE A MEAL 07/23/20   [name], FNP  rosuvastatin (CRESTOR) 40 MG tablet TAKE 1 TABLET DAILY 04/08/20   Elayne Snare, MD  sertraline (ZOLOFT) 50 MG tablet Take 1 tablet (50 mg total) by mouth daily. 02/13/20   [name], FNP  sodium chloride (OCEAN) 0.65 % SOLN nasal spray Place 1 spray into both nostrils daily.    [provider]    Family History    Family  History  Problem Relation Age of Onset   Hypertension Mother    Heart disease Father    Diabetes Father    Cancer Sister 16       OVARIAN    Thyroid disease Sister    Heart disease Brother    Diabetes Paternal Grandmother    Thyroid disease Daughter    Colon cancer Neg Hx    Esophageal cancer Neg Hx    Rectal cancer Neg Hx    Stomach cancer Neg Hx    She indicated that her mother is deceased. She indicated that her father is deceased. She indicated that the status of her sister is unknown. She indicated that the status of her brother is unknown. She indicated that the status of her paternal grandmother is unknown. She indicated that her daughter is deceased. She indicated that the status of her neg hx is unknown.   Social History    Social History   Socioeconomic History   Marital status: Widowed    Spouse name: Not on file   Number of children: 4   Years of education: Not on file   Highest education level: Not on file  Occupational History   Occupation: retired  Tobacco Use   Smoking status: Never   Smokeless tobacco: Never  Vaping Use    Vaping Use: Never used  Substance and Sexual Activity   Alcohol use: No    Alcohol/week: 0.0 standard drinks of alcohol   Drug use: Not on file   Sexual activity: Not Currently    Comment: 1st intercourse- 20, partners- 2, widow  Other Topics Concern   Not on file  Social History Narrative   Not on file   Social Determinants of Health   Financial Resource Strain: Low Risk  (05/26/2021)   Overall Financial Resource Strain (CARDIA)    Difficulty of Paying Living Expenses: Not hard at all  Food Insecurity: No Food Insecurity (05/26/2021)   Hunger Vital Sign    Worried About Running Out of Food in the Last Year: Never true    [place] in the Last Year: Never true  Transportation Needs: No Transportation Needs (05/26/2021)   PRAPARE - Hydrologist (Medical): No    Lack of Transportation (Non-Medical): No  Physical Activity: Insufficiently Active (05/26/2021)   Exercise Vital Sign    Days of Exercise per Week: 2 days    Minutes of Exercise per Session: 60 min  Stress: No Stress Concern Present (05/26/2021)   [place]    Feeling of Stress : Not at all  Social Connections: Moderately Isolated (05/26/2021)   Social Connection and Isolation Panel [NHANES]    Frequency of Communication with Friends and Family: More than three times a week    Frequency of Social Gatherings with Friends and Family: Twice a week    Attends Religious Services: More than 4 times per year    Active Member of [employer] or Organizations: No    Attends Archivist Meetings: Never    Marital Status: Widowed  Intimate Partner Violence: Not At Risk (05/26/2021)   Humiliation, Afraid, Rape, and Kick questionnaire    Fear of Current or Ex-Partner: No    Emotionally Abused: No    Physically Abused: No    Sexually Abused: No     Review of Systems    General:  No chills, fever, night sweats or weight changes.   Cardiovascular:  No chest pain, dyspnea on exertion, edema, orthopnea, palpitations, paroxysmal nocturnal dyspnea. Dermatological: No rash, lesions/masses Respiratory: No cough, dyspnea Urologic: No hematuria, dysuria Abdominal:   No nausea, vomiting, diarrhea, bright red blood per rectum, melena, or hematemesis Neurologic:  No visual changes, wkns, changes in mental status. All other systems reviewed and are otherwise negative except as noted above.  Physical Exam    VS:  There were no vitals taken for this visit. , BMI There is no height or weight on file to calculate BMI. GEN: Well nourished, well developed, in no acute distress. HEENT: normal. Neck: Supple, no JVD, carotid bruits, or masses. Cardiac: RRR, no murmurs, rubs, or gallops. No clubbing, cyanosis, edema.  Radials/DP/PT 2+ and equal bilaterally.  Respiratory:  Respirations regular and unlabored, clear to auscultation bilaterally. GI: Soft, nontender, nondistended, BS + x 4. MS: no deformity or atrophy. Skin: warm and dry, no rash. Neuro:  Strength and sensation are intact. Psych: Normal affect.  Accessory Clinical Findings    Recent Labs: 01/26/2021: ALT 16 05/14/2021: Hemoglobin 13.3; Platelet Count 262   Recent Lipid Panel    Component Value Date/Time   CHOL 155 01/26/2021 1023   TRIG 111 01/26/2021 1023   HDL 50 01/26/2021 1023   CHOLHDL 3.1 01/26/2021 1023   CHOLHDL 2.6 11/20/2019 0828   VLDL 23 11/20/2019 0828   LDLCALC 85 01/26/2021 1023    ECG personally reviewed by me today-none today.  Echocardiogram 10/15/2019 IMPRESSIONS     1. Left ventricular ejection fraction, by estimation, is 55 to 60%. The  left ventricle has normal function. The left ventricle has no regional  wall motion abnormalities. There is mild left ventricular hypertrophy.  Left ventricular diastolic parameters  are consistent with Grade I diastolic dysfunction (impaired relaxation).   2. Right ventricular systolic function is  normal. The right ventricular  size is normal. There is normal pulmonary artery systolic pressure. The  estimated right ventricular systolic pressure is 60.6 mmHg.   3. Left atrial size was mildly dilated.   4. The mitral valve is normal in structure. Trivial mitral valve  regurgitation. No evidence of mitral stenosis.   5. The aortic valve is tricuspid. Aortic valve regurgitation is not  visualized. No aortic stenosis is present.   6. The inferior vena cava is normal in size with greater than 50%  respiratory variability, suggesting right atrial pressure of 3 mmHg.   Cardiac catheterization 11/19/2019 Ost RCA to Prox RCA lesion is 99% stenosed. Prox LAD to Mid LAD lesion is 60% stenosed.   Barbara Maxwell is a 86 y.o. female      [id number] LOCATION:  FACILITY: [facility] PHYSICIAN: Quay Burow, M.D. 04/21/1935  Diagnostic Dominance: Right    Intervention   Assessment & Plan   1.  Essential hypertension-BP today ***112/68.  Continues to be well-controlled at home. Continue amlodipine, metoprolol Heart healthy low-sodium diet-salty 6 reviewed Increase physical activity as tolerated  Coronary artery disease-denies recent episodes of arm neck back or chest discomfort.  Had cardiac catheterization 11/19/2019 and was noted to have moderate proximal LAD disease, and occluded calcified ostial RCA stenosis with high-grade 2-3 left to right collaterals.  Arthrectomy was attempted but lesion was not will be crossed.  Medical management was recommended at that time. Continue amlodipine, metoprolol, rosuvastatin, Zetia, aspirin, nitroglycerin Heart healthy low-sodium diet Increase physical activity as tolerated  Hyperlipidemia-01/26/2021: Cholesterol, Total 155; HDL 50; LDL Chol Calc (NIH) 85; Triglycerides 111 Continue rosuvastatin, ezetimibe Heart healthy low-sodium high-fiber diet.  Increase physical activity as tolerated Repeat fasting lipids and LFTs 12/23  Disposition: Follow-up  with Dr. Gwenlyn Found in December.  Barbara Ng. Georgio Hattabaugh NP-C    08/30/2021, 6:11 PM [facility] [address] Office [phone] Fax [phone]  Notice: This dictation was prepared with Dragon dictation along with smaller phrase technology. Any transcriptional errors that result from this process are unintentional and may not be corrected upon review.  I spent 15*** minutes examining this patient, reviewing medications, and using patient centered shared decision making involving her cardiac care.  Prior to her visit I spent greater than 20 minutes reviewing her past medical history,  medications, and prior cardiac tests.

## 2021-09-01 ENCOUNTER — Encounter: Payer: Self-pay | Admitting: General Practice

## 2021-09-01 ENCOUNTER — Ambulatory Visit (INDEPENDENT_AMBULATORY_CARE_PROVIDER_SITE_OTHER): Payer: Medicare Other | Admitting: General Practice

## 2021-09-01 VITALS — BP 126/60 | HR 68 | Ht 63.0 in | Wt 144.2 lb

## 2021-09-01 DIAGNOSIS — I2510 Atherosclerotic heart disease of native coronary artery without angina pectoris: Secondary | ICD-10-CM | POA: Diagnosis not present

## 2021-09-01 DIAGNOSIS — E782 Mixed hyperlipidemia: Secondary | ICD-10-CM

## 2021-09-01 DIAGNOSIS — I10 Essential (primary) hypertension: Secondary | ICD-10-CM

## 2021-09-01 MED ORDER — NITROGLYCERIN 0.4 MG SL SUBL: 0.4000 mg | SUBLINGUAL_TABLET | SUBLINGUAL | 5 refills | Status: AC | PRN

## 2021-09-01 NOTE — Patient Instructions (Signed)
Medication Instructions:  The current medical regimen is effective;  continue present plan and medications as directed. Please refer to the Current Medication list given to you today.   *If you need a refill on your cardiac medications before your next appointment, please call your pharmacy*  Lab Work:   Testing/Procedures:  NONE    NONE If you have labs (blood work) drawn today and your tests are completely normal, you will receive your results only by: [place] (if you have MyChart) OR  A paper copy in the mail If you have any lab test that is abnormal or we need to change your treatment, we will call you to review the results.  Special Instructions PLEASE CONTINUE PHYSICAL ACTIVITY AS TOLERATED   PLEASE READ AND FOLLOW INCREASED FIBER DIET  Follow-Up: Your next appointment:  DECEMBER  In Person with Quay Burow, MD   At [facility], you and your health needs are our priority.  As part of our continuing mission to provide you with exceptional heart care, we have created designated Provider Care Teams.  These Care Teams include your primary Cardiologist (physician) and Advanced Practice Providers (APPs -  Physician Assistants and Nurse Practitioners) who all work together to provide you with the care you need, when you need it.  Important Information About Sugar     High-Fiber Eating Plan Fiber, also called dietary fiber, is a type of carbohydrate. It is found foods such as fruits, vegetables, whole grains, and beans. A high-fiber diet can have many health benefits. Your health care provider may recommend a high-fiber diet to help: Prevent constipation. Fiber can make your bowel movements more regular. Lower your cholesterol. Relieve the following conditions: Inflammation of veins in the anus (hemorrhoids). Inflammation of specific areas of the digestive tract (uncomplicated diverticulosis). A problem of the large intestine, also called the colon, that sometimes causes  pain and diarrhea (irritable bowel syndrome, or IBS). Prevent overeating as part of a weight-loss plan. Prevent heart disease, type 2 diabetes, and certain cancers. What are tips for following this plan? Reading food labels  Check the nutrition facts label on food products for the amount of dietary fiber. Choose foods that have 5 grams of fiber or more per serving. The goals for recommended daily fiber intake include: Men (age 6 or younger): 34-38 g. Men (over age 84): 28-34 g. Women (age 64 or younger): 25-28 g. Women (over age 21): 22-25 g. Your daily fiber goal is _____________ g. Shopping Choose whole fruits and vegetables instead of processed forms, such as apple juice or applesauce. Choose a wide variety of high-fiber foods such as avocados, lentils, oats, and kidney beans. Read the nutrition facts label of the foods you choose. Be aware of foods with added fiber. These foods often have high sugar and sodium amounts per serving. Cooking Use whole-grain flour for baking and cooking. Cook with brown rice instead of white rice. Meal planning Start the day with a breakfast that is high in fiber, such as a cereal that contains 5 g of fiber or more per serving. Eat breads and cereals that are made with whole-grain flour instead of refined flour or white flour. Eat brown rice, bulgur wheat, or millet instead of white rice. Use beans in place of meat in soups, salads, and pasta dishes. Be sure that half of the grains you eat each day are whole grains. General information You can get the recommended daily intake of dietary fiber by: Eating a variety of fruits, vegetables,  grains, nuts, and beans. Taking a fiber supplement if you are not able to take in enough fiber in your diet. It is better to get fiber through food than from a supplement. Gradually increase how much fiber you consume. If you increase your intake of dietary fiber too quickly, you may have bloating, cramping, or  gas. Drink plenty of water to help you digest fiber. Choose high-fiber snacks, such as berries, raw vegetables, nuts, and popcorn. What foods should I eat? Fruits Berries. Pears. Apples. Oranges. Avocado. Prunes and raisins. Dried figs. Vegetables Sweet potatoes. Spinach. Kale. Artichokes. Cabbage. Broccoli. Cauliflower. Green peas. Carrots. Squash. Grains Whole-grain breads. Multigrain cereal. Oats and oatmeal. Brown rice. Barley. Bulgur wheat. [place]. Quinoa. Bran muffins. Popcorn. Rye wafer crackers. Meats and other proteins Navy beans, kidney beans, and pinto beans. Soybeans. Split peas. Lentils. Nuts and seeds. Dairy Fiber-fortified yogurt. Beverages Fiber-fortified soy milk. Fiber-fortified orange juice. Other foods Fiber bars. The items listed above may not be a complete list of recommended foods and beverages. Contact a dietitian for more information. What foods should I avoid? Fruits Fruit juice. Cooked, strained fruit. Vegetables Fried potatoes. Canned vegetables. Well-cooked vegetables. Grains White bread. Pasta made with refined flour. White rice. Meats and other proteins Fatty cuts of meat. Fried chicken or fried fish. Dairy Milk. Yogurt. Cream cheese. Sour cream. Fats and oils Butters. Beverages Soft drinks. Other foods Cakes and pastries. The items listed above may not be a complete list of foods and beverages to avoid. Talk with your dietitian about what choices are best for you. Summary Fiber is a type of carbohydrate. It is found in foods such as fruits, vegetables, whole grains, and beans. A high-fiber diet has many benefits. It can help to prevent constipation, lower blood cholesterol, aid weight loss, and reduce your risk of heart disease, diabetes, and certain cancers. Increase your intake of fiber gradually. Increasing fiber too quickly may cause cramping, bloating, and gas. Drink plenty of water while you increase the amount of fiber you consume. The  best sources of fiber include whole fruits and vegetables, whole grains, nuts, seeds, and beans. This information is not intended to replace advice given to you by your health care provider. Make sure you discuss any questions you have with your health care provider. Document Revised: 06/14/2019 Document Reviewed: 06/14/2019 Elsevier Patient Education  [place].

## 2021-09-04 ENCOUNTER — Other Ambulatory Visit: Payer: Self-pay

## 2021-09-04 MED ORDER — AMLODIPINE BESYLATE 2.5 MG PO TABS: ORAL_TABLET | ORAL | 3 refills | Status: DC

## 2021-09-18 DIAGNOSIS — H6123 Impacted cerumen, bilateral: Secondary | ICD-10-CM | POA: Diagnosis not present

## 2021-09-21 ENCOUNTER — Other Ambulatory Visit: Payer: Self-pay | Admitting: *Deleted

## 2021-09-21 MED ORDER — PANTOPRAZOLE SODIUM 40 MG PO TBEC: DELAYED_RELEASE_TABLET | ORAL | 2 refills | Status: DC

## 2021-11-26 ENCOUNTER — Other Ambulatory Visit: Payer: Self-pay | Admitting: Family

## 2021-11-30 DIAGNOSIS — Z23 Encounter for immunization: Secondary | ICD-10-CM | POA: Diagnosis not present

## 2021-12-18 ENCOUNTER — Encounter: Payer: Self-pay | Admitting: Family

## 2021-12-18 DIAGNOSIS — Z1231 Encounter for screening mammogram for malignant neoplasm of breast: Secondary | ICD-10-CM | POA: Diagnosis not present

## 2021-12-18 LAB — HM MAMMOGRAPHY

## 2021-12-18 NOTE — Progress Notes (Signed)
Mammogram threats.

## 2022-01-17 IMAGING — CT CT HEART MORP W/ CTA COR W/ SCORE W/ CA W/CM &/OR W/O CM
4 of 7 series · 8 of 20 positions shown, 9 images · IV contrast (APPLIED)
Comparison: None.
COMPARISON: None.

Addendum:
EXAM:
OVER-READ INTERPRETATION  CT CHEST

The following report is an over-read performed by radiologist Dr.
Marios-Nicolaos Nwanag [REDACTED] on 11/07/2019. This
over-read does not include interpretation of cardiac or coronary
anatomy or pathology. The coronary calcium score/coronary CTA
interpretation by the cardiologist is attached.
CLINICAL DATA: Chest pain
Cardiac CTA
MEDICATIONS:
Sub lingual nitro. 4mg x 2
TECHNIQUE: The patient was scanned on a Siemens [REDACTED]ice scanner. Gantry
rotation speed was 250 msecs. Collimation was 0.6 mm. A 100 kV
prospective scan was triggered in the ascending thoracic aorta at
35-75% of the R-R interval. Average HR during the scan was 60 bpm.
The 3D data set was interpreted on a dedicated work station using
MPR, MIP and VRT modes. A total of 80cc of contrast was used.

[Series 6: best diast 70 % · axial · 0.39mm/px · z∈[+1252,+1292]mm · 2 of 301 slices shown, 3 images]
[im 101/301  vessel]
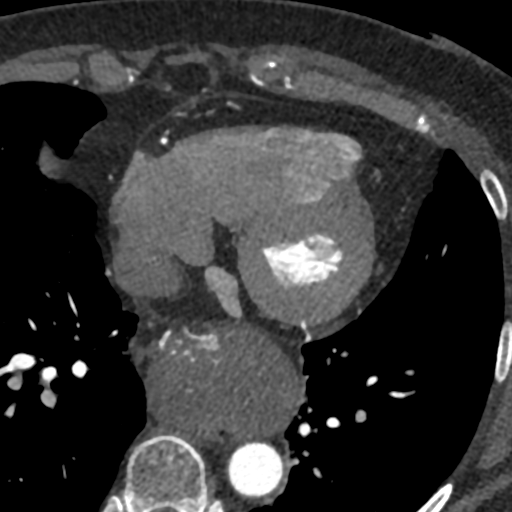
[im 101/301  lung]
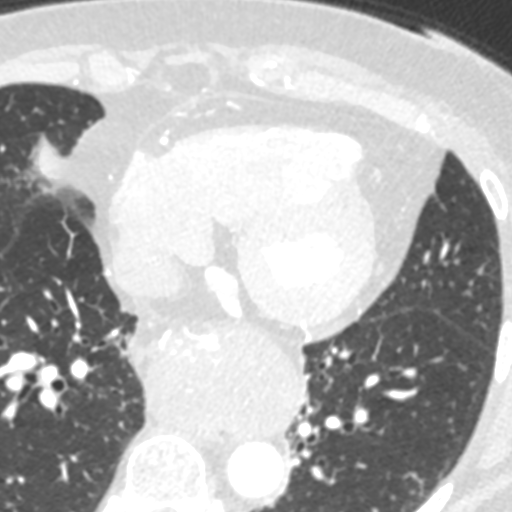
[im 201/301  vessel]
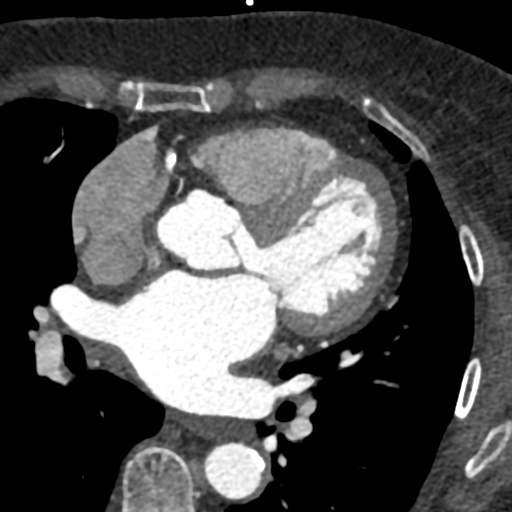

[Series 7: best syst 31 % · axial · 0.39mm/px · z∈[+1252,+1292]mm · 2 of 301 slices shown]
[im 101/301  vessel]
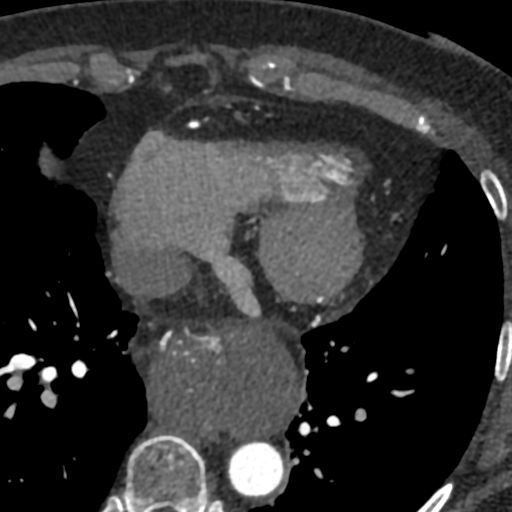
[im 201/301  vessel]
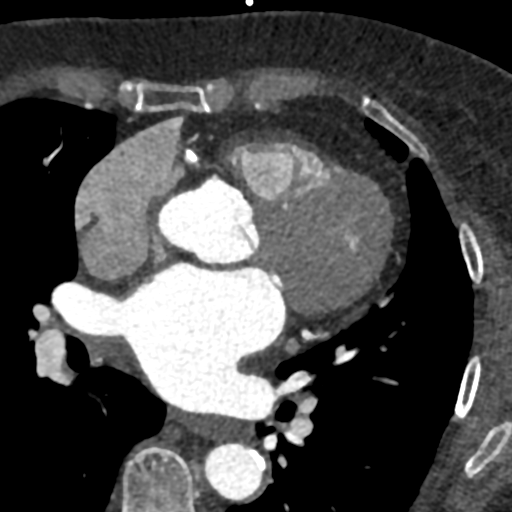

[Series 9: ts diast sharp 70 % · axial · 0.39mm/px · z∈[+1252,+1292]mm · 2 of 301 slices shown]
[im 101/301  lung]
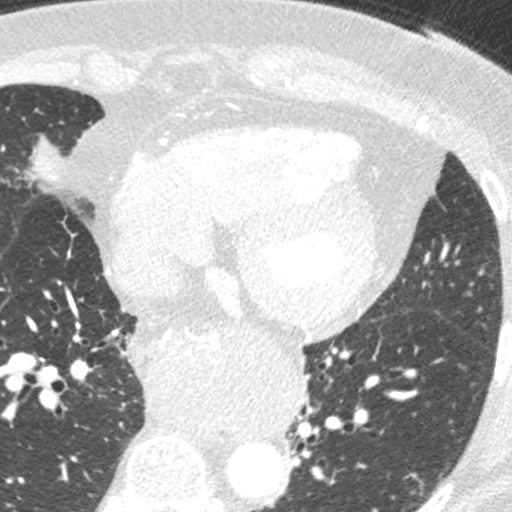
[im 201/301  lung]
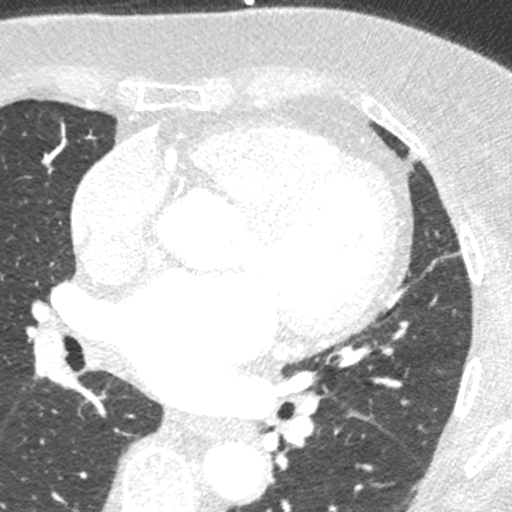

[Series 10: ts syst sharp 31 % · axial · 0.39mm/px · z∈[+1252,+1292]mm · 2 of 301 slices shown]
[im 101/301  lung]
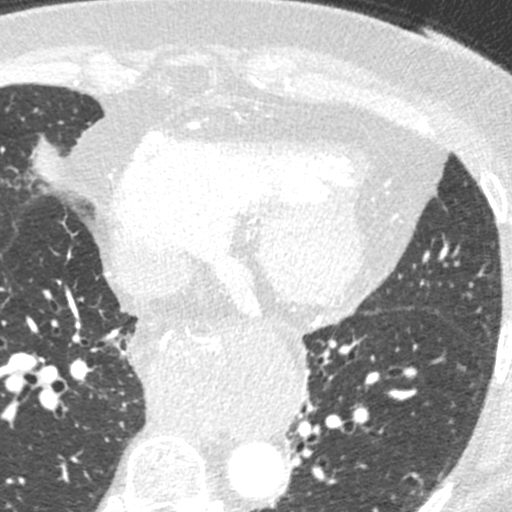
[im 201/301  lung]
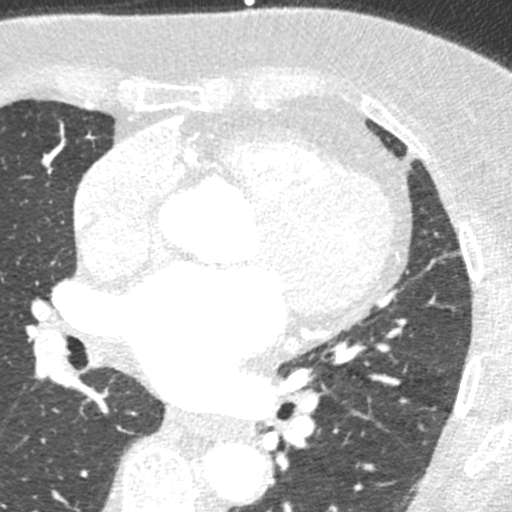

[8 of 20 positions shown; findings below may reference images not displayed]

FINDINGS: Aortic atherosclerosis. Large hiatal hernia. Within the visualized
portions of the thorax there are no suspicious appearing pulmonary
nodules or masses, there is no acute consolidative airspace disease,
no pleural effusions, no pneumothorax and no lymphadenopathy.
Visualized portions of the upper abdomen are unremarkable. There are
no aggressive appearing lytic or blastic lesions noted in the
visualized portions of the skeleton.
IMPRESSION: 1. Large hiatal hernia.
2. Aortic Atherosclerosis (TARAG-MCE.E).
FINDINGS: Non-cardiac: See separate report from [REDACTED].

No LA appendage thrombus noted. Pulmonary veins drain normally to
the left atrium.

Calcium Score: 8828 Agatston units.

Coronary Arteries: Right dominant with no anomalies

LM: Calcified plaque ostial and distal LM, mild (<50%) stenosis.

LAD system: Extensive primarily calcified plaque in the proximal
LAD, suspect mild (<50%) stenosis. Large D1, extensive mixed plaque
proximally. Possible moderate (51-69%) stenosis.

Circumflex system: Calcified plaque proximal and mid LCx, mild
(<50%) stenosis.

RCA system: Heavily calcified ostial/proximal RCA, cannot rule [REDACTED]%) stenosis. Calcified plaque in the mid and distal
RCA, mild (<50%) stenosis.
IMPRESSION: 1. Coronary artery calcium score 8828 Agatston units. This places
the patient in the 95th percentile for age and gender, suggesting
high risk for future cardiac events.

2. Extensive ostial/proximal RCA calcification. Possible moderate
stenosis but cannot rule out blooming artifact making this look
worse.

3. Extensive proximal LAD plaque, suspect no more than mild
stenosis. Large D1 with possible moderate proximal stenosis.

Will send for FFR.

Klisman Sarasty

*** End of Addendum ***
EXAM:
OVER-READ INTERPRETATION  CT CHEST

The following report is an over-read performed by radiologist Dr.
Marios-Nicolaos Nwanag [REDACTED] on [DATE]. This
over-read does not include interpretation of cardiac or coronary
anatomy or pathology. The coronary calcium score/coronary CTA
interpretation by the cardiologist is attached.
FINDINGS: Aortic atherosclerosis. Large hiatal hernia. Within the visualized
portions of the thorax there are no suspicious appearing pulmonary
nodules or masses, there is no acute consolidative airspace disease,
no pleural effusions, no pneumothorax and no lymphadenopathy.
Visualized portions of the upper abdomen are unremarkable. There are
no aggressive appearing lytic or blastic lesions noted in the
visualized portions of the skeleton.
IMPRESSION: 1. Large hiatal hernia.
2. Aortic Atherosclerosis (TARAG-MCE.E).

## 2022-01-25 DIAGNOSIS — Z23 Encounter for immunization: Secondary | ICD-10-CM | POA: Diagnosis not present

## 2022-02-03 ENCOUNTER — Ambulatory Visit: Payer: Medicare Other | Admitting: Cardiovascular Disease

## 2022-02-16 ENCOUNTER — Ambulatory Visit: Payer: Medicare Other | Attending: Cardiovascular Disease | Admitting: Cardiovascular Disease

## 2022-02-16 ENCOUNTER — Encounter: Payer: Self-pay | Admitting: Cardiovascular Disease

## 2022-02-16 VITALS — BP 122/70 | HR 63 | Ht 63.0 in | Wt 146.6 lb

## 2022-02-16 DIAGNOSIS — I2510 Atherosclerotic heart disease of native coronary artery without angina pectoris: Secondary | ICD-10-CM | POA: Insufficient documentation

## 2022-02-16 DIAGNOSIS — E782 Mixed hyperlipidemia: Secondary | ICD-10-CM | POA: Insufficient documentation

## 2022-02-16 DIAGNOSIS — I10 Essential (primary) hypertension: Secondary | ICD-10-CM | POA: Insufficient documentation

## 2022-02-16 NOTE — Patient Instructions (Signed)
Medication Instructions:  Your physician recommends that you continue on your current medications as directed. Please refer to the Current Medication list given to you today.  *If you need a refill on your cardiac medications before your next appointment, please call your pharmacy*   Follow-Up: At [facility], you and your health needs are our priority.  As part of our continuing mission to provide you with exceptional heart care, we have created designated Provider Care Teams.  These Care Teams include your primary Cardiologist (physician) and Advanced Practice Providers (APPs -  Physician Assistants and Nurse Practitioners) who all work together to provide you with the care you need, when you need it.  We recommend signing up for the patient portal called "MyChart".  Sign up information is provided on this After Visit Summary.  MyChart is used to connect with patients for Virtual Visits (Telemedicine).  Patients are able to view lab/test results, encounter notes, upcoming appointments, etc.  Non-urgent messages can be sent to your provider as well.   To learn more about what you can do with MyChart, go to [email or web address].    Your next appointment:   12 month(s)  The format for your next appointment:   In Person  Provider:   Jonathan Berry, MD   
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
## 2022-02-16 NOTE — Assessment & Plan Note (Signed)
History of hyperlipidemia on high-dose Crestor, Zetia and fenofibrate.  Her most recent lipid profile performed 01/26/2021 revealed a total cholesterol 155, LDL of 85 and HDL 50.

## 2022-02-16 NOTE — Progress Notes (Signed)
EKG

## 2022-02-16 NOTE — Assessment & Plan Note (Signed)
History of essential hypertension blood pressure measured today at 122/70.  She is on amlodipine, and metoprolol.

## 2022-02-16 NOTE — Assessment & Plan Note (Signed)
History of CAD status post coronary calcium score which was 1681 suggesting proximal to mid RCA disease and LAD disease as well.  I performed outpatient diagnostic coronary artery angiography via the right radial approach 11/19/2019 revealing a 60% proximal LAD stenosis just after the first diagonal branch which at the I did not think was physiologically significant and a 99% calcified ostial RCA disease with grade 2-3 left-to-right collaterals.  I placed a temporary transvenous pacemaker via the right common femoral artery and attempted orbital atherectomy and stenting however I was unable to cross the wire and confirmed that it was intraluminal and therefore I aborted the procedure.  Since that time she is only had to take sublingual nitroglycerin 3 times and is fairly active.

## 2022-04-21 DIAGNOSIS — H903 Sensorineural hearing loss, bilateral: Secondary | ICD-10-CM | POA: Diagnosis not present

## 2022-05-01 ENCOUNTER — Other Ambulatory Visit: Payer: Self-pay | Admitting: Family

## 2022-05-13 ENCOUNTER — Other Ambulatory Visit: Payer: Self-pay | Admitting: Cardiovascular Disease

## 2022-06-14 DIAGNOSIS — L814 Other melanin hyperpigmentation: Secondary | ICD-10-CM | POA: Diagnosis not present

## 2022-06-14 DIAGNOSIS — D225 Melanocytic nevi of trunk: Secondary | ICD-10-CM | POA: Diagnosis not present

## 2022-06-14 DIAGNOSIS — D1801 Hemangioma of skin and subcutaneous tissue: Secondary | ICD-10-CM | POA: Diagnosis not present

## 2022-06-14 DIAGNOSIS — D692 Other nonthrombocytopenic purpura: Secondary | ICD-10-CM | POA: Diagnosis not present

## 2022-06-14 DIAGNOSIS — Z85820 Personal history of malignant melanoma of skin: Secondary | ICD-10-CM | POA: Diagnosis not present

## 2022-06-14 DIAGNOSIS — L821 Other seborrheic keratosis: Secondary | ICD-10-CM | POA: Diagnosis not present

## 2022-06-30 ENCOUNTER — Other Ambulatory Visit: Payer: Self-pay | Admitting: Family

## 2022-07-02 ENCOUNTER — Ambulatory Visit (INDEPENDENT_AMBULATORY_CARE_PROVIDER_SITE_OTHER): Payer: Medicare Other | Admitting: Family

## 2022-07-02 ENCOUNTER — Ambulatory Visit (HOSPITAL_BASED_OUTPATIENT_CLINIC_OR_DEPARTMENT_OTHER)
Admission: RE | Admit: 2022-07-02 | Discharge: 2022-07-02 | Disposition: A | Discharge status: Home / Self Care | Payer: Medicare Other | Source: Ambulatory Visit | Attending: Family | Admitting: Family

## 2022-07-02 ENCOUNTER — Encounter: Payer: Self-pay | Admitting: Family

## 2022-07-02 VITALS — BP 118/64 | HR 67 | Ht 63.0 in | Wt 145.4 lb

## 2022-07-02 DIAGNOSIS — M545 Low back pain, unspecified: Secondary | ICD-10-CM

## 2022-07-02 DIAGNOSIS — M8588 Other specified disorders of bone density and structure, other site: Secondary | ICD-10-CM | POA: Diagnosis not present

## 2022-07-02 DIAGNOSIS — M47816 Spondylosis without myelopathy or radiculopathy, lumbar region: Secondary | ICD-10-CM | POA: Diagnosis not present

## 2022-07-02 MED ORDER — TRAMADOL HCL 50 MG PO TABS: 50.0000 mg | ORAL_TABLET | Freq: Four times a day (QID) | ORAL | 1 refills | Status: DC | PRN

## 2022-07-02 MED ORDER — PREDNISONE 20 MG PO TABS: 20.0000 mg | ORAL_TABLET | Freq: Every day | ORAL | 0 refills | Status: DC

## 2022-07-02 NOTE — Progress Notes (Signed)
Barbara Maxwell is a 87 y.o. female with the following history as recorded in [employer]:  Patient Active Problem List   Diagnosis Date Noted   Chronic low back pain without sciatica 11/07/2020   Antiplatelet or antithrombotic long-term use 06/13/2020   Iron deficiency anemia 06/02/2020   CAD (coronary artery disease) 11/19/2019   Atypical chest pain 09/28/2019   Dyspnea on exertion 09/28/2019   H/O vitamin D deficiency 01/21/2015   Osteopenia 01/21/2015   Vaginal atrophy 01/21/2015   Mixed hyperlipidemia 02/04/2014   Family history of ovarian cancer 11/13/2012   Rectocele 11/07/2012   Hyperlipidemia 09/29/2012   Essential hypertension 08/07/2009   ALLERGIC RHINITIS 08/07/2009   HYPERSOMNIA WITH SLEEP APNEA UNSPECIFIED 08/04/2009    Current Outpatient Medications  Medication Sig Dispense Refill   acetaminophen (TYLENOL) 325 MG tablet Take 325 mg by mouth every 6 (six) hours as needed for moderate pain.      amLODipine (NORVASC) 2.5 MG tablet TAKE 1 TABLET(2.5 MG) BY MOUTH DAILY 90 tablet 3   Calcium Carbonate-Vitamin D (CALCIUM + D PO) Take 1 tablet by mouth daily.     Carboxymethylcellulose Sodium (REFRESH TEARS OP) Place 1 drop into both eyes daily.     clopidogrel (PLAVIX) 75 MG tablet TAKE 1 TABLET DAILY WITH BREAKFAST 90 tablet 3   ezetimibe (ZETIA) 10 MG tablet Take 1 tablet (10 mg total) by mouth daily. 90 tablet 3   fenofibrate 54 MG tablet TAKE 1 TABLET(54 MG) BY MOUTH DAILY 90 tablet 3   iron polysaccharides (NIFEREX) 150 MG capsule Take 1 capsule (150 mg total) by mouth daily. 90 capsule 2   metoprolol succinate (TOPROL-XL) 25 MG 24 hr tablet TAKE 1 TABLET DAILY 90 tablet 3   nitroGLYCERIN (NITROSTAT) 0.4 MG SL tablet Place 1 tablet (0.4 mg total) under the tongue every 5 (five) minutes as needed for chest pain. 25 tablet 5   pantoprazole (PROTONIX) 40 MG tablet TAKE 1 TABLET TWICE A DAY BEFORE MEALS 180 tablet 1   predniSONE (DELTASONE) 20 MG tablet Take 1 tablet (20 mg  total) by mouth daily with breakfast. 5 tablet 0   rosuvastatin (CRESTOR) 40 MG tablet Take 1 tablet (40 mg total) by mouth daily. 90 tablet 3   sertraline (ZOLOFT) 50 MG tablet Take 1 tablet (50 mg total) by mouth daily. 90 tablet 3   sodium chloride (OCEAN) 0.65 % SOLN nasal spray Place 1 spray into both nostrils daily.     traMADol (ULTRAM) 50 MG tablet Take 1 tablet (50 mg total) by mouth every 6 (six) hours as needed. 30 tablet 1   No current facility-administered medications for this visit.    Allergies: Bee venom, Simvastatin, Codeine, Evista [raloxifene], and Percocet [oxycodone-acetaminophen]  Past Medical History:  Diagnosis Date   Anemia    B12 deficiency    Cancer (HCC)    [facility]-  MELANOMA REMOVED FROM BACK    Coronary artery disease    Depression    Hypercholesterolemia    Hypertension     Past Surgical History:  Procedure Laterality Date   ABDOMINAL HYSTERECTOMY     VAGINAL HYSTERECTOMY WITH OVARIAN PRESERVATION    ABDOMINOPLASTY  1998   APPENDECTOMY     BREAST SURGERY  1985   BREAST REDUCTION    CARDIAC CATHETERIZATION  11/19/2019   CORONARY BALLOON ANGIOPLASTY N/A 11/19/2019   Procedure: CORONARY BALLOON ANGIOPLASTY;  Surgeon: Runell Gess, MD;  Location: [facility];  Service: Cardiovascular;  Laterality: N/A;  LEFT HEART CATH AND CORONARY ANGIOGRAPHY N/A 11/19/2019   Procedure: LEFT HEART CATH AND CORONARY ANGIOGRAPHY;  Surgeon: Runell Gess, MD;  Location: [facility];  Service: Cardiovascular;  Laterality: N/A;   TONSILLECTOMY AND ADENOIDECTOMY      Family History  Problem Relation Age of Onset   Hypertension Mother    Heart disease Father    Diabetes Father    Cancer Sister 95       OVARIAN    Thyroid disease Sister    Heart disease Brother    Diabetes Paternal Grandmother    Thyroid disease Daughter    Colon cancer Neg Hx    Esophageal cancer Neg Hx    Rectal cancer Neg Hx    Stomach cancer Neg Hx     Social History    Tobacco Use   Smoking status: Never   Smokeless tobacco: Never  Substance Use Topics   Alcohol use: No    Alcohol/week: 0.0 standard drinks of alcohol    Subjective:   Patient requesting refill on Tramadol that she uses occasionally for back pain; recently injured her back while trying to cut her grass; symptoms x 1 week- has been using OTC Salonpas patches; no numbness or tingling radiating into extremities;   Objective:  Vitals:   07/02/22 0932  BP: 118/64  Pulse: 67  SpO2: 99%  Weight: 145 lb 6.4 oz (66 kg)  Height: 5\' 3"  (1.6 m)    General: Well developed, well nourished, in no acute distress  Skin : Warm and dry.  Head: Normocephalic and atraumatic  Eyes: Sclera and conjunctiva clear; pupils round and reactive to light; extraocular movements intact  Ears: External normal; canals clear; tympanic membranes normal  Oropharynx: Pink, supple. No suspicious lesions  Neck: Supple without thyromegaly, adenopathy  Lungs: Respirations unlabored;  Musculoskeletal: No deformities; no active joint inflammation  Extremities: No edema, cyanosis, clubbing  Vessels: Symmetric bilaterally  Neurologic: Alert and oriented; speech intact; face symmetrical; moves all extremities well; CNII-XII intact without focal deficit   Assessment:  1. Low back pain, unspecified back pain laterality, unspecified chronicity, unspecified whether sciatica present     Plan:  Suspect muscular; update lumbar X-ray today per patient request; Rx for Prednisone 20 mg qd x 5 days; refill updated on Tramadol; follow up worse, no better- to consider PT and/or orthopedic referral;   No follow-ups on file.  Orders Placed This Encounter  Procedures   DG Lumbar Spine Complete    Standing Status:   Future    Number of Occurrences:   1    Standing Expiration Date:   07/02/2023    Order Specific Question:   Reason for Exam (SYMPTOM  OR DIAGNOSIS REQUIRED)    Answer:   low back pain    Order Specific Question:    Preferred imaging location?    Answer:   [facility]    Requested Prescriptions   Signed Prescriptions Disp Refills   predniSONE (DELTASONE) 20 MG tablet 5 tablet 0    Sig: Take 1 tablet (20 mg total) by mouth daily with breakfast.   traMADol (ULTRAM) 50 MG tablet 30 tablet 1    Sig: Take 1 tablet (50 mg total) by mouth every 6 (six) hours as needed.

## 2022-07-07 ENCOUNTER — Telehealth: Payer: Self-pay

## 2022-07-07 NOTE — Telephone Encounter (Signed)
-----   Message from [name], FNP sent at 07/07/2022  3:32 PM EDT ----- How is she feeling? If her back is not improved, I would like to get MRI for better evaluation of her back.

## 2022-07-07 NOTE — Telephone Encounter (Signed)
Spoke with pt, pt states her back has "improved some" but is still experiencing pain with "certain movements". Pt states she is okay with going to get the MRI done. Pt was advised I would call her back to inform her of the location for the MRI.

## 2022-07-08 ENCOUNTER — Other Ambulatory Visit: Payer: Self-pay | Admitting: Family

## 2022-07-08 DIAGNOSIS — R937 Abnormal findings on diagnostic imaging of other parts of musculoskeletal system: Secondary | ICD-10-CM

## 2022-07-25 ENCOUNTER — Ambulatory Visit (HOSPITAL_BASED_OUTPATIENT_CLINIC_OR_DEPARTMENT_OTHER)
Admission: RE | Admit: 2022-07-25 | Discharge: 2022-07-25 | Disposition: A | Discharge status: Home / Self Care | Payer: Medicare Other | Source: Ambulatory Visit | Attending: Family | Admitting: Family

## 2022-07-25 DIAGNOSIS — R937 Abnormal findings on diagnostic imaging of other parts of musculoskeletal system: Secondary | ICD-10-CM | POA: Diagnosis not present

## 2022-07-25 DIAGNOSIS — M5126 Other intervertebral disc displacement, lumbar region: Secondary | ICD-10-CM | POA: Diagnosis not present

## 2022-08-05 ENCOUNTER — Other Ambulatory Visit: Payer: Self-pay | Admitting: Family

## 2022-08-05 DIAGNOSIS — S32000S Wedge compression fracture of unspecified lumbar vertebra, sequela: Secondary | ICD-10-CM

## 2022-08-09 ENCOUNTER — Ambulatory Visit: Payer: Medicare Other | Admitting: Orthopaedic Surgery

## 2022-08-10 ENCOUNTER — Other Ambulatory Visit: Payer: Self-pay | Admitting: Family

## 2022-08-18 ENCOUNTER — Other Ambulatory Visit: Payer: Self-pay | Admitting: Family

## 2022-08-18 DIAGNOSIS — F419 Anxiety disorder, unspecified: Secondary | ICD-10-CM

## 2022-08-25 ENCOUNTER — Ambulatory Visit: Payer: Medicare Other

## 2022-08-27 ENCOUNTER — Ambulatory Visit (INDEPENDENT_AMBULATORY_CARE_PROVIDER_SITE_OTHER): Payer: Medicare Other | Admitting: *Deleted

## 2022-08-27 ENCOUNTER — Ambulatory Visit (INDEPENDENT_AMBULATORY_CARE_PROVIDER_SITE_OTHER): Payer: Medicare Other | Admitting: Family

## 2022-08-27 VITALS — BP 118/62 | HR 62 | Temp 97.7°F | Resp 18 | Ht 63.0 in | Wt 143.8 lb

## 2022-08-27 VITALS — BP 118/62 | HR 62 | Ht 63.0 in | Wt 143.0 lb

## 2022-08-27 DIAGNOSIS — R350 Frequency of micturition: Secondary | ICD-10-CM

## 2022-08-27 DIAGNOSIS — M8580 Other specified disorders of bone density and structure, unspecified site: Secondary | ICD-10-CM

## 2022-08-27 DIAGNOSIS — Z0000 Encounter for general adult medical examination without abnormal findings: Secondary | ICD-10-CM

## 2022-08-27 DIAGNOSIS — E559 Vitamin D deficiency, unspecified: Secondary | ICD-10-CM

## 2022-08-27 DIAGNOSIS — Z13820 Encounter for screening for osteoporosis: Secondary | ICD-10-CM | POA: Diagnosis not present

## 2022-08-27 DIAGNOSIS — E782 Mixed hyperlipidemia: Secondary | ICD-10-CM

## 2022-08-27 DIAGNOSIS — M810 Age-related osteoporosis without current pathological fracture: Secondary | ICD-10-CM | POA: Diagnosis not present

## 2022-08-27 DIAGNOSIS — D508 Other iron deficiency anemias: Secondary | ICD-10-CM

## 2022-08-27 LAB — COMPREHENSIVE METABOLIC PANEL
ALT: 12 U/L (ref 0–35)
AST: 22 U/L (ref 0–37)
Albumin: 4.4 g/dL (ref 3.5–5.2)
Alkaline Phosphatase: 53 U/L (ref 39–117)
BUN: 15 mg/dL (ref 6–23)
CO2: 28 mEq/L (ref 19–32)
Calcium: 10.5 mg/dL (ref 8.4–10.5)
Chloride: 106 mEq/L (ref 96–112)
Creatinine, Ser: 0.97 mg/dL (ref 0.40–1.20)
GFR: 53.41 mL/min — ABNORMAL LOW (ref >60.00)
Glucose, Bld: 96 mg/dL (ref 70–99)
Potassium: 4.8 mEq/L (ref 3.5–5.1)
Sodium: 142 mEq/L (ref 135–145)
Total Bilirubin: 1 mg/dL (ref 0.2–1.2)
Total Protein: 6.9 g/dL (ref 6.0–8.3)

## 2022-08-27 LAB — CBC WITH DIFFERENTIAL/PLATELET
Basophils Absolute: 0 10*3/uL (ref 0.0–0.1)
Basophils Relative: 0.5 % (ref 0.0–3.0)
Eosinophils Absolute: 0.2 10*3/uL (ref 0.0–0.7)
Eosinophils Relative: 3.4 % (ref 0.0–5.0)
HCT: 40.6 % (ref 36.0–46.0)
Hemoglobin: 13 g/dL (ref 12.0–15.0)
Lymphocytes Relative: 33.9 % (ref 12.0–46.0)
Lymphs Abs: 2.3 10*3/uL (ref 0.7–4.0)
MCHC: 32.1 g/dL (ref 30.0–36.0)
MCV: 86.9 fl (ref 78.0–100.0)
Monocytes Absolute: 0.5 10*3/uL (ref 0.1–1.0)
Monocytes Relative: 7.5 % (ref 3.0–12.0)
Neutro Abs: 3.8 10*3/uL (ref 1.4–7.7)
Neutrophils Relative %: 54.7 % (ref 43.0–77.0)
Platelets: 277 10*3/uL (ref 150.0–400.0)
RBC: 4.67 Mil/uL (ref 3.87–5.11)
RDW: 13.9 % (ref 11.5–15.5)
WBC: 6.9 10*3/uL (ref 4.0–10.5)

## 2022-08-27 LAB — IBC + FERRITIN
Ferritin: 17.2 ng/mL (ref 10.0–291.0)
Iron: 93 ug/dL (ref 42–145)
Saturation Ratios: 20.3 % (ref 20.0–50.0)
TIBC: 459.2 ug/dL — ABNORMAL HIGH (ref 250.0–450.0)
Transferrin: 328 mg/dL (ref 212.0–360.0)

## 2022-08-27 LAB — VITAMIN D 25 HYDROXY (VIT D DEFICIENCY, FRACTURES): VITD: 71.63 ng/mL (ref 30.00–100.00)

## 2022-08-27 LAB — LIPID PANEL
Cholesterol: 156 mg/dL (ref 0–200)
HDL: 50.9 mg/dL (ref >39.00)
LDL Cholesterol: 85 mg/dL (ref 0–99)
NonHDL: 105.29
Total CHOL/HDL Ratio: 3
Triglycerides: 103 mg/dL (ref 0.0–149.0)
VLDL: 20.6 mg/dL (ref 0.0–40.0)

## 2022-08-27 NOTE — Patient Instructions (Signed)
Ms. Barbara Maxwell , Thank you for taking time to come for your Medicare Wellness Visit. I appreciate your ongoing commitment to your health goals. Please review the following plan we discussed and let me know if I can assist you in the future.     This is a list of the screening recommended for you and due dates:  Health Maintenance  Topic Date Due   Zoster (Shingles) Vaccine (1 of 2) Never done   Flu Shot  09/23/2022   Medicare Annual Wellness Visit  08/27/2023   DTaP/Tdap/Td vaccine (2 - Td or Tdap) 07/16/2024   Pneumonia Vaccine  Completed   DEXA scan (bone density measurement)  Completed   HPV Vaccine  Aged Out   COVID-19 Vaccine  Discontinued    Next appointment: Follow up in one year for your annual wellness visit.   Preventive Care 85 Years and Older, Female Preventive care refers to lifestyle choices and visits with your health care provider that can promote health and wellness. What does preventive care include? A yearly physical exam. This is also called an annual well check. Dental exams once or twice a year. Routine eye exams. Ask your health care provider how often you should have your eyes checked. Personal lifestyle choices, including: Daily care of your teeth and gums. Regular physical activity. Eating a healthy diet. Avoiding tobacco and drug use. Limiting alcohol use. Practicing safe sex. Taking low-dose aspirin every day. Taking vitamin and mineral supplements as recommended by your health care provider. What happens during an annual well check? The services and screenings done by your health care provider during your annual well check will depend on your age, overall health, lifestyle risk factors, and family history of disease. Counseling  Your health care provider may ask you questions about your: Alcohol use. Tobacco use. Drug use. Emotional well-being. Home and relationship well-being. Sexual activity. Eating habits. History of falls. Memory and  ability to understand (cognition). Work and work Astronomer. Reproductive health. Screening  You may have the following tests or measurements: Height, weight, and BMI. Blood pressure. Lipid and cholesterol levels. These may be checked every 5 years, or more frequently if you are over 39 years old. Skin check. Lung cancer screening. You may have this screening every year starting at age 19 if you have a 30-pack-year history of smoking and currently smoke or have quit within the past 15 years. Fecal occult blood test (FOBT) of the stool. You may have this test every year starting at age 57. Flexible sigmoidoscopy or colonoscopy. You may have a sigmoidoscopy every 5 years or a colonoscopy every 10 years starting at age 64. Hepatitis C blood test. Hepatitis B blood test. Sexually transmitted disease (STD) testing. Diabetes screening. This is done by checking your blood sugar (glucose) after you have not eaten for a while (fasting). You may have this done every 1-3 years. Bone density scan. This is done to screen for osteoporosis. You may have this done starting at age 4. Mammogram. This may be done every 1-2 years. Talk to your health care provider about how often you should have regular mammograms. Talk with your health care provider about your test results, treatment options, and if necessary, the need for more tests. Vaccines  Your health care provider may recommend certain vaccines, such as: Influenza vaccine. This is recommended every year. Tetanus, diphtheria, and acellular pertussis (Tdap, Td) vaccine. You may need a Td booster every 10 years. Zoster vaccine. You may need this after age 80. Pneumococcal  13-valent conjugate (PCV13) vaccine. One dose is recommended after age 42. Pneumococcal polysaccharide (PPSV23) vaccine. One dose is recommended after age 19. Talk to your health care provider about which screenings and vaccines you need and how often you need them. This information is  not intended to replace advice given to you by your health care provider. Make sure you discuss any questions you have with your health care provider. Document Released: 03/07/2015 Document Revised: 10/29/2015 Document Reviewed: 12/10/2014 Elsevier Interactive Patient Education  2017 [employer].  Fall Prevention in the Home Falls can cause injuries. They can happen to people of all ages. There are many things you can do to make your home safe and to help prevent falls. What can I do on the outside of my home? Regularly fix the edges of walkways and driveways and fix any cracks. Remove anything that might make you trip as you walk through a door, such as a raised step or threshold. Trim any bushes or trees on the path to your home. Use bright outdoor lighting. Clear any walking paths of anything that might make someone trip, such as rocks or tools. Regularly check to see if handrails are loose or broken. Make sure that both sides of any steps have handrails. Any raised decks and porches should have guardrails on the edges. Have any leaves, snow, or ice cleared regularly. Use sand or salt on walking paths during winter. Clean up any spills in your garage right away. This includes oil or grease spills. What can I do in the bathroom? Use night lights. Install grab bars by the toilet and in the tub and shower. Do not use towel bars as grab bars. Use non-skid mats or decals in the tub or shower. If you need to sit down in the shower, use a plastic, non-slip stool. Keep the floor dry. Clean up any water that spills on the floor as soon as it happens. Remove soap buildup in the tub or shower regularly. Attach bath mats securely with double-sided non-slip rug tape. Do not have throw rugs and other things on the floor that can make you trip. What can I do in the bedroom? Use night lights. Make sure that you have a light by your bed that is easy to reach. Do not use any sheets or blankets that  are too big for your bed. They should not hang down onto the floor. Have a firm chair that has side arms. You can use this for support while you get dressed. Do not have throw rugs and other things on the floor that can make you trip. What can I do in the kitchen? Clean up any spills right away. Avoid walking on wet floors. Keep items that you use a lot in easy-to-reach places. If you need to reach something above you, use a strong step stool that has a grab bar. Keep electrical cords out of the way. Do not use floor polish or wax that makes floors slippery. If you must use wax, use non-skid floor wax. Do not have throw rugs and other things on the floor that can make you trip. What can I do with my stairs? Do not leave any items on the stairs. Make sure that there are handrails on both sides of the stairs and use them. Fix handrails that are broken or loose. Make sure that handrails are as long as the stairways. Check any carpeting to make sure that it is firmly attached to the stairs. Fix any carpet  that is loose or worn. Avoid having throw rugs at the top or bottom of the stairs. If you do have throw rugs, attach them to the floor with carpet tape. Make sure that you have a light switch at the top of the stairs and the bottom of the stairs. If you do not have them, ask someone to add them for you. What else can I do to help prevent falls? Wear shoes that: Do not have high heels. Have rubber bottoms. Are comfortable and fit you well. Are closed at the toe. Do not wear sandals. If you use a stepladder: Make sure that it is fully opened. Do not climb a closed stepladder. Make sure that both sides of the stepladder are locked into place. Ask someone to hold it for you, if possible. Clearly mark and make sure that you can see: Any grab bars or handrails. First and last steps. Where the edge of each step is. Use tools that help you move around (mobility aids) if they are needed. These  include: Canes. Walkers. Scooters. Crutches. Turn on the lights when you go into a dark area. Replace any light bulbs as soon as they burn out. Set up your furniture so you have a clear path. Avoid moving your furniture around. If any of your floors are uneven, fix them. If there are any pets around you, be aware of where they are. Review your medicines with your doctor. Some medicines can make you feel dizzy. This can increase your chance of falling. Ask your doctor what other things that you can do to help prevent falls. This information is not intended to replace advice given to you by your health care provider. Make sure you discuss any questions you have with your health care provider. Document Released: 12/05/2008 Document Revised: 07/17/2015 Document Reviewed: 03/15/2014 Elsevier Interactive Patient Education  2017 [employer].

## 2022-08-27 NOTE — Progress Notes (Signed)
Subjective:   Barbara Maxwell is a 87 y.o. female who presents for Medicare Annual (Subsequent) preventive examination.  Visit Complete: In person   Review of Systems     Cardiac Risk Factors include: dyslipidemia;hypertension;advanced age (>25men, >85 women)     Objective:    Today's Vitals   08/27/22 0937  BP: 118/62  Pulse: 62  Weight: 143 lb (64.9 kg)  Height: 5\' 3"  (1.6 m)   Body mass index is 25.33 kg/m.     08/27/2022    9:45 AM 05/26/2021    2:59 PM 05/14/2021    2:50 PM 08/04/2020    1:37 PM 07/02/2020    8:32 AM 06/19/2020    9:10 AM 06/02/2020    1:23 PM  Advanced Directives  Does Patient Have a Medical Advance Directive? Yes Yes Yes Yes Yes Yes Yes  Type of Estate agent of [place];Living will Healthcare Power of [employer] of [place];Living will Healthcare Power of [place];Living will Living will Living will Living will  Does patient want to make changes to medical advance directive? No - Patient declined  No - Patient declined No - Patient declined No - Patient declined  No - Patient declined  Copy of Healthcare Power of Attorney in Chart? Yes - validated most recent copy scanned in chart (See row information) Yes - validated most recent copy scanned in chart (See row information) Yes - validated most recent copy scanned in chart (See row information)        Current Medications (verified) Outpatient Encounter Medications as of 08/27/2022  Medication Sig   acetaminophen (TYLENOL) 325 MG tablet Take 325 mg by mouth every 6 (six) hours as needed for moderate pain.    amLODipine (NORVASC) 2.5 MG tablet TAKE 1 TABLET(2.5 MG) BY MOUTH DAILY   Calcium Carbonate-Vitamin D (CALCIUM + D PO) Take 1 tablet by mouth daily.   Carboxymethylcellulose Sodium (REFRESH TEARS OP) Place 1 drop into both eyes daily.   clopidogrel (PLAVIX) 75 MG tablet TAKE 1 TABLET DAILY WITH BREAKFAST   ezetimibe (ZETIA) 10 MG tablet Take 1 tablet (10 mg  total) by mouth daily.   fenofibrate 54 MG tablet TAKE 1 TABLET(54 MG) BY MOUTH DAILY   iron polysaccharides (NIFEREX) 150 MG capsule TAKE 1 CAPSULE(150 MG) BY MOUTH DAILY   metoprolol succinate (TOPROL-XL) 25 MG 24 hr tablet TAKE 1 TABLET DAILY   nitroGLYCERIN (NITROSTAT) 0.4 MG SL tablet Place 1 tablet (0.4 mg total) under the tongue every 5 (five) minutes as needed for chest pain.   pantoprazole (PROTONIX) 40 MG tablet TAKE 1 TABLET TWICE A DAY BEFORE MEALS   rosuvastatin (CRESTOR) 40 MG tablet Take 1 tablet (40 mg total) by mouth daily.   sertraline (ZOLOFT) 50 MG tablet TAKE 1 TABLET DAILY   sodium chloride (OCEAN) 0.65 % SOLN nasal spray Place 1 spray into both nostrils daily.   traMADol (ULTRAM) 50 MG tablet Take 1 tablet (50 mg total) by mouth every 6 (six) hours as needed.   [DISCONTINUED] iron polysaccharides (NIFEREX) 150 MG capsule Take 1 capsule (150 mg total) by mouth daily.   [DISCONTINUED] predniSONE (DELTASONE) 20 MG tablet Take 1 tablet (20 mg total) by mouth daily with breakfast. (Patient not taking: Reported on 08/27/2022)   [DISCONTINUED] sertraline (ZOLOFT) 50 MG tablet Take 1 tablet (50 mg total) by mouth daily.   No facility-administered encounter medications on file as of 08/27/2022.    Allergies (verified) Bee venom, Simvastatin, Codeine, Evista [raloxifene], and  Percocet [oxycodone-acetaminophen]   History: Past Medical History:  Diagnosis Date   Anemia    B12 deficiency    Cancer (HCC)    [facility]-  MELANOMA REMOVED FROM BACK    Coronary artery disease    Depression    Hypercholesterolemia    Hypertension    Past Surgical History:  Procedure Laterality Date   ABDOMINAL HYSTERECTOMY     VAGINAL HYSTERECTOMY WITH OVARIAN PRESERVATION    ABDOMINOPLASTY  1998   APPENDECTOMY     BREAST SURGERY  1985   BREAST REDUCTION    CARDIAC CATHETERIZATION  11/19/2019   CORONARY BALLOON ANGIOPLASTY N/A 11/19/2019   Procedure: CORONARY BALLOON ANGIOPLASTY;  Surgeon:  Runell Gess, MD;  Location: [facility];  Service: Cardiovascular;  Laterality: N/A;   LEFT HEART CATH AND CORONARY ANGIOGRAPHY N/A 11/19/2019   Procedure: LEFT HEART CATH AND CORONARY ANGIOGRAPHY;  Surgeon: Runell Gess, MD;  Location: [facility];  Service: Cardiovascular;  Laterality: N/A;   TONSILLECTOMY AND ADENOIDECTOMY     Family History  Problem Relation Age of Onset   Hypertension Mother    Heart disease Father    Diabetes Father    Cancer Sister 36       OVARIAN    Thyroid disease Sister    Heart disease Brother    Diabetes Paternal Grandmother    Thyroid disease Daughter    Colon cancer Neg Hx    Esophageal cancer Neg Hx    Rectal cancer Neg Hx    Stomach cancer Neg Hx    Social History   Socioeconomic History   Marital status: Widowed    Spouse name: Not on file   Number of children: 4   Years of education: Not on file   Highest education level: Not on file  Occupational History   Occupation: retired  Tobacco Use   Smoking status: Never   Smokeless tobacco: Never  Vaping Use   Vaping Use: Never used  Substance and Sexual Activity   Alcohol use: No    Alcohol/week: 0.0 standard drinks of alcohol   Drug use: Not on file   Sexual activity: Not Currently    Comment: 1st intercourse- 20, partners- 2, widow  Other Topics Concern   Not on file  Social History Narrative   Not on file   Social Determinants of Health   Financial Resource Strain: Low Risk  (08/27/2022)   Overall Financial Resource Strain (CARDIA)    Difficulty of Paying Living Expenses: Not hard at all  Food Insecurity: No Food Insecurity (08/27/2022)   Hunger Vital Sign    Worried About Running Out of Food in the Last Year: Never true    Ran Out of Food in the Last Year: Never true  Transportation Needs: No Transportation Needs (08/27/2022)   PRAPARE - Administrator, Civil Service (Medical): No    Lack of Transportation (Non-Medical): No  Physical Activity:  Insufficiently Active (08/27/2022)   Exercise Vital Sign    Days of Exercise per Week: 2 days    Minutes of Exercise per Session: 60 min  Stress: No Stress Concern Present (08/27/2022)   [employer] of Occupational Health - Occupational Stress Questionnaire    Feeling of Stress : Not at all  Social Connections: Moderately Isolated (05/26/2021)   Social Connection and Isolation Panel [NHANES]    Frequency of Communication with Friends and Family: More than three times a week    Frequency of Social  Gatherings with Friends and Family: Twice a week    Attends Religious Services: More than 4 times per year    Active Member of [employer] or Organizations: No    Attends Banker Meetings: Never    Marital Status: Widowed    Tobacco Counseling Counseling given: Not Answered   Clinical Intake:  Pre-visit preparation completed: Yes  Pain : No/denies pain  BMI - recorded: 25.33 Nutritional Status: BMI 25 -29 Overweight Nutritional Risks: None Diabetes: No  How often do you need to have someone help you when you read instructions, pamphlets, or other written materials from your doctor or pharmacy?: 1 - Never  Interpreter Needed?: No  Information entered by :: Chip Canepa,CMA   Activities of Daily Living    08/27/2022    9:42 AM  In your present state of health, do you have any difficulty performing the following activities:  Hearing? 1  Comment wears hearing aids  Vision? 0  Difficulty concentrating or making decisions? 0  Walking or climbing stairs? 0  Dressing or bathing? 0  Doing errands, shopping? 0  Preparing Food and eating ? N  Using the Toilet? N  In the past six months, have you accidently leaked urine? Y  Do you have problems with loss of bowel control? N  Managing your Medications? N  Managing your Finances? N  Housekeeping or managing your Housekeeping? N    Patient Care Team: [name], FNP as PCP - General (Internal Medicine) Runell Gess, MD as PCP - Cardiology (Cardiology)  Indicate any recent Medical Services you may have received from other than [facility] providers in the past year (date may be approximate).     Assessment:   This is a routine wellness examination for Dannae.  Hearing/Vision screen No results found.  Dietary issues and exercise activities discussed:     Goals Addressed   None    Depression Screen    08/27/2022    9:03 AM 07/02/2022    9:42 AM 08/21/2021   10:45 AM 05/26/2021    2:58 PM 03/31/2021    1:08 PM 11/07/2020    9:37 AM 04/14/2020    9:59 AM  PHQ 2/9 Scores  PHQ - 2 Score 0 0 0 0 0 0 0  PHQ- 9 Score       0    Fall Risk    08/27/2022    9:45 AM 08/27/2022    9:02 AM 07/02/2022    9:41 AM 08/21/2021   10:44 AM 05/26/2021    3:00 PM  Fall Risk   Falls in the past year? 0 0 0 0 0  Number falls in past yr: 0 0 0 0 0  Injury with Fall? 0 0 0 0 0  Risk for fall due to : No Fall Risks No Fall Risks No Fall Risks  Impaired vision  Follow up Falls evaluation completed Falls evaluation completed Falls evaluation completed  Falls prevention discussed    MEDICARE RISK AT HOME:  Medicare Risk at Home - 08/27/22 0944     Any stairs in or around the home? Yes    If so, are there any without handrails? No    Home free of loose throw rugs in walkways, pet beds, electrical cords, etc? Yes    Adequate lighting in your home to reduce risk of falls? Yes    Life alert? No    Use of a cane, walker or w/c? No    Grab bars  in the bathroom? Yes    Shower chair or bench in shower? Yes    Elevated toilet seat or a handicapped toilet? No             TIMED UP AND GO:  Was the test performed?  Yes  Length of time to ambulate 10 feet: 5 sec Gait steady and fast without use of assistive device    Cognitive Function:        08/27/2022    9:48 AM 05/26/2021    3:02 PM  6CIT Screen  What Year? 0 points 0 points  What month? 0 points 0 points  What time? 0 points 0 points  Count back from 20  2 points 0 points  Months in reverse 0 points 0 points  Repeat phrase 0 points 0 points  Total Score 2 points 0 points    Immunizations Immunization History  Administered Date(s) Administered   Fluad Quad(high Dose 65+) 11/28/2019   Influenza, High Dose Seasonal PF 12/30/2015, 11/30/2016, 11/17/2017   Influenza,inj,Quad PF,6+ Mos 11/22/2012, 11/30/2013, 12/23/2014   PFIZER(Purple Top)SARS-COV-2 Vaccination 04/07/2019, 04/29/2019, 12/22/2019   Pneumococcal Conjugate-13 02/04/2014   Pneumococcal Polysaccharide-23 01/07/2003   Tdap 07/17/2014   Zoster, Live 06/12/2010    TDAP status: Up to date  Flu Vaccine status: Up to date  Pneumococcal vaccine status: Up to date  Covid-19 vaccine status: Information provided on how to obtain vaccines.   Qualifies for Shingles Vaccine? Yes   Zostavax completed Yes   Shingrix Completed?: No.    Education has been provided regarding the importance of this vaccine. Patient has been advised to call insurance company to determine out of pocket expense if they have not yet received this vaccine. Advised may also receive vaccine at local pharmacy or Health Dept. Verbalized acceptance and understanding.  Screening Tests Health Maintenance  Topic Date Due   Zoster Vaccines- Shingrix (1 of 2) Never done   Medicare Annual Wellness (AWV)  05/27/2022   INFLUENZA VACCINE  09/23/2022   DTaP/Tdap/Td (2 - Td or Tdap) 07/16/2024   Pneumonia Vaccine 67+ Years old  Completed   DEXA SCAN  Completed   HPV VACCINES  Aged Out   COVID-19 Vaccine  Discontinued    Health Maintenance  Health Maintenance Due  Topic Date Due   Zoster Vaccines- Shingrix (1 of 2) Never done   Medicare Annual Wellness (AWV)  05/27/2022    Colorectal cancer screening: No longer required.   Mammogram status: Completed 12/18/21. Repeat every year  Bone Density status: Ordered 08/27/22. Pt provided with contact info and advised to call to schedule appt.  Lung Cancer Screening:  (Low Dose CT Chest recommended if Age 24-80 years, 20 pack-year currently smoking OR have quit w/in 15years.) does not qualify.   Additional Screening:  Hepatitis C Screening: does not qualify  Vision Screening: Recommended annual ophthalmology exams for early detection of glaucoma and other disorders of the eye. Is the patient up to date with their annual eye exam?  No  Who is the provider or what is the name of the office in which the patient attends annual eye exams? Haven't gotten new eye doctor since previous one retired If pt is not established with a provider, would they like to be referred to a provider to establish care? No .   Dental Screening: Recommended annual dental exams for proper oral hygiene  Diabetic Foot Exam: N/a  Community Resource Referral / Chronic Care Management: CRR required this visit?  No   CCM  required this visit?  No     Plan:     I have personally reviewed and noted the following in the patient's chart:   Medical and social history Use of alcohol, tobacco or illicit drugs  Current medications and supplements including opioid prescriptions. Patient is currently taking opioid prescriptions. Information provided to patient regarding non-opioid alternatives. Patient advised to discuss non-opioid treatment plan with their provider. Functional ability and status Nutritional status Physical activity Advanced directives List of other physicians Hospitalizations, surgeries, and ER visits in previous 12 months Vitals Screenings to include cognitive, depression, and falls Referrals and appointments  In addition, I have reviewed and discussed with patient certain preventive protocols, quality metrics, and best practice recommendations. A written personalized care plan for preventive services as well as general preventive health recommendations were provided to patient.     Donne Anon, CMA   08/27/2022   After Visit Summary: sent to mychart  Nurse  Notes: None

## 2022-08-27 NOTE — Patient Instructions (Addendum)
Please consider getting Shingrix at your pharmacy as we discussed; you will need 2 shots over 6 months;   Reach out to Dr. Allyson Sabal to get your yearly follow up for December 2024/ January 2025;   Please call your eye doctor to get your yearly exam;   Keep seeing the dentist 2 x per year;

## 2022-08-27 NOTE — Progress Notes (Signed)
Barbara Maxwell is a 87 y.o. female with the following history as recorded in EpicCare:  Patient Active Problem List   Diagnosis Date Noted   Chronic low back pain without sciatica 11/07/2020   Antiplatelet or antithrombotic long-term use 06/13/2020   Iron deficiency anemia 06/02/2020   CAD (coronary artery disease) 11/19/2019   Atypical chest pain 09/28/2019   Dyspnea on exertion 09/28/2019   H/O vitamin D deficiency 01/21/2015   Osteopenia 01/21/2015   Vaginal atrophy 01/21/2015   Mixed hyperlipidemia 02/04/2014   Family history of ovarian cancer 11/13/2012   Rectocele 11/07/2012   Hyperlipidemia 09/29/2012   Essential hypertension 08/07/2009   ALLERGIC RHINITIS 08/07/2009   HYPERSOMNIA WITH SLEEP APNEA UNSPECIFIED 08/04/2009    Current Outpatient Medications  Medication Sig Dispense Refill   acetaminophen (TYLENOL) 325 MG tablet Take 325 mg by mouth every 6 (six) hours as needed for moderate pain.      amLODipine (NORVASC) 2.5 MG tablet TAKE 1 TABLET(2.5 MG) BY MOUTH DAILY 90 tablet 3   Calcium Carbonate-Vitamin D (CALCIUM + D PO) Take 1 tablet by mouth daily.     Carboxymethylcellulose Sodium (REFRESH TEARS OP) Place 1 drop into both eyes daily.     clopidogrel (PLAVIX) 75 MG tablet TAKE 1 TABLET DAILY WITH BREAKFAST 90 tablet 3   ezetimibe (ZETIA) 10 MG tablet Take 1 tablet (10 mg total) by mouth daily. 90 tablet 3   fenofibrate 54 MG tablet TAKE 1 TABLET(54 MG) BY MOUTH DAILY 90 tablet 3   iron polysaccharides (NIFEREX) 150 MG capsule TAKE 1 CAPSULE(150 MG) BY MOUTH DAILY 90 capsule 2   metoprolol succinate (TOPROL-XL) 25 MG 24 hr tablet TAKE 1 TABLET DAILY 90 tablet 3   nitroGLYCERIN (NITROSTAT) 0.4 MG SL tablet Place 1 tablet (0.4 mg total) under the tongue every 5 (five) minutes as needed for chest pain. 25 tablet 5   pantoprazole (PROTONIX) 40 MG tablet TAKE 1 TABLET TWICE A DAY BEFORE MEALS 180 tablet 1   rosuvastatin (CRESTOR) 40 MG tablet Take 1 tablet (40 mg total)  by mouth daily. 90 tablet 3   sertraline (ZOLOFT) 50 MG tablet TAKE 1 TABLET DAILY 90 tablet 3   sodium chloride (OCEAN) 0.65 % SOLN nasal spray Place 1 spray into both nostrils daily.     traMADol (ULTRAM) 50 MG tablet Take 1 tablet (50 mg total) by mouth every 6 (six) hours as needed. 30 tablet 1   No current facility-administered medications for this visit.    Allergies: Bee venom, Simvastatin, Codeine, Evista [raloxifene], and Percocet [oxycodone-acetaminophen]  Past Medical History:  Diagnosis Date   Anemia    B12 deficiency    Cancer (HCC)    [facility]-  MELANOMA REMOVED FROM BACK    Coronary artery disease    Depression    Hypercholesterolemia    Hypertension     Past Surgical History:  Procedure Laterality Date   ABDOMINAL HYSTERECTOMY     VAGINAL HYSTERECTOMY WITH OVARIAN PRESERVATION    ABDOMINOPLASTY  1998   APPENDECTOMY     BREAST SURGERY  1985   BREAST REDUCTION    CARDIAC CATHETERIZATION  11/19/2019   CORONARY BALLOON ANGIOPLASTY N/A 11/19/2019   Procedure: CORONARY BALLOON ANGIOPLASTY;  Surgeon: Runell Gess, MD;  Location: [facility];  Service: Cardiovascular;  Laterality: N/A;   LEFT HEART CATH AND CORONARY ANGIOGRAPHY N/A 11/19/2019   Procedure: LEFT HEART CATH AND CORONARY ANGIOGRAPHY;  Surgeon: Runell Gess, MD;  Location: [facility] INVASIVE  CV LAB;  Service: Cardiovascular;  Laterality: N/A;   TONSILLECTOMY AND ADENOIDECTOMY      Family History  Problem Relation Age of Onset   Hypertension Mother    Heart disease Father    Diabetes Father    Cancer Sister 65       OVARIAN    Thyroid disease Sister    Heart disease Brother    Diabetes Paternal Grandmother    Thyroid disease Daughter    Colon cancer Neg Hx    Esophageal cancer Neg Hx    Rectal cancer Neg Hx    Stomach cancer Neg Hx     Social History   Tobacco Use   Smoking status: Never   Smokeless tobacco: Never  Substance Use Topics   Alcohol use: No    Alcohol/week: 0.0  standard drinks of alcohol    Subjective:   Presents for yearly follow up on chronic care needs; planning to get AWV after appointment with me this morning; Recently found to have compression fracture of lumbar spine- has been feeling much better since going back to water aerobics;      Objective:  Vitals:   08/27/22 0854  BP: 118/62  Pulse: 62  Resp: 18  Temp: 97.7 F (36.5 C)  TempSrc: Temporal  SpO2: 98%  Weight: 143 lb 12.8 oz (65.2 kg)  Height: 5\' 3"  (1.6 m)    General: Well developed, well nourished, in no acute distress  Skin : Warm and dry.  Head: Normocephalic and atraumatic  Eyes: Sclera and conjunctiva clear; pupils round and reactive to light; extraocular movements intact  Ears: External normal; canals clear; tympanic membranes normal  Oropharynx: Pink, supple. No suspicious lesions  Neck: Supple without thyromegaly, adenopathy  Lungs: Respirations unlabored; clear to auscultation bilaterally without wheeze, rales, rhonchi  CVS exam: normal rate and regular rhythm.  Musculoskeletal: No deformities; no active joint inflammation  Extremities: No edema, cyanosis, clubbing  Vessels: Symmetric bilaterally  Neurologic: Alert and oriented; speech intact; face symmetrical; moves all extremities well; CNII-XII intact without focal deficit    Assessment:  1. Mixed hyperlipidemia   2. Other iron deficiency anemia   3. Vitamin D deficiency   4. Osteopenia, unspecified location   5. Osteoporosis screening   6. Localized osteoporosis ([place])   7. Urinary frequency     Plan:   Age appropriate preventive healthcare needs addressed; encouraged regular eye doctor and dental exams; encouraged regular exercise; will update labs and refills as needed today; order updated for bone density; keep planned follow up with orthopedics for follow up on compression fracture; follow-up to be determined;   No follow-ups on file.  Orders Placed This Encounter  Procedures   Urine  Culture    Order Specific Question:   Source    Answer:   urinary frequency   DG Bone Density    Standing Status:   Future    Standing Expiration Date:   08/27/2023    Scheduling Instructions:     Would like to go to [facility] Question:   Reason for Exam (SYMPTOM  OR DIAGNOSIS REQUIRED)    Answer:   osteopenia/ vitamin d deficiency    Order Specific Question:   Preferred imaging location?    Answer:   External   CBC with Differential/Platelet   Comp Met (CMET)   Lipid panel   Vitamin D (25 hydroxy)   IBC + Ferritin    Requested Prescriptions    No prescriptions requested  or ordered in this encounter

## 2022-08-28 LAB — URINE CULTURE
MICRO NUMBER:: 15164640
Result:: NO GROWTH
SPECIMEN QUALITY:: ADEQUATE

## 2022-08-30 ENCOUNTER — Other Ambulatory Visit: Payer: Self-pay | Admitting: Cardiovascular Disease

## 2022-08-31 ENCOUNTER — Ambulatory Visit (INDEPENDENT_AMBULATORY_CARE_PROVIDER_SITE_OTHER): Payer: Medicare Other | Admitting: Orthopaedic Surgery

## 2022-08-31 VITALS — BP 123/61 | HR 72

## 2022-08-31 DIAGNOSIS — S32000A Wedge compression fracture of unspecified lumbar vertebra, initial encounter for closed fracture: Secondary | ICD-10-CM | POA: Insufficient documentation

## 2022-08-31 DIAGNOSIS — S32040A Wedge compression fracture of fourth lumbar vertebra, initial encounter for closed fracture: Secondary | ICD-10-CM | POA: Diagnosis not present

## 2022-08-31 NOTE — Progress Notes (Signed)
Office Visit Note   Patient: Barbara Maxwell           Date of Birth: [birth year]           MRN: [id number] Visit Date: 08/31/2022              Requested by: [name], FNP [address] [place],  [place] [address] PCP: [name], FNP   Assessment & Plan: Visit Diagnoses:  1. Compression fracture of L4 vertebra, initial encounter (HCC)     Plan: I recommended patient take some calcium and vitamin D.  If her bone density test shows she has got osteoporosis then she can start on a biphosphonate such as weekly Fosamax.  She will follow-up with Ria Clock after bone density test and we will order this at [facility] which was patient's choice.  MRI images plain radiographs reviewed and discussed.  She does not need a brace.  She will avoid lifting activities for the next month or so and will continue on a walking program gradually working up to 2 miles a day.  Follow-Up Instructions: No follow-ups on file.   Orders:  No orders of the defined types were placed in this encounter.  No orders of the defined types were placed in this encounter.     Procedures: No procedures performed   Clinical Data: No additional findings.   Subjective: Chief Complaint  Patient presents with   Lower Back - Pain    HPI 87 year old female referred by Ria Clock, FNP for new onset 4 to 6 weeks ago with back pain.  She is moving to assisted living and got on the riding lawnmower to mow a large area.  Did not recall in the lifting but afterwards had increased back pain.  X-rays suggested L4 injury she did have a Schmorl's node at L3 superior endplate.  MRI was performed on 08/05/2022 which showed acute or subacute L4 fracture with 35% loss of height.  She had some foraminal stenosis at L3-4 and L5-S1 which was moderate.  Patient has all history of spina bifida closed when she was a child.  Previous history of bone density test which was normal.  She states she  was supposed to be getting a bone density test scheduled at [facility] but somehow this did not get done.  She walks her dog in the neighborhood frequently.  Additionally she enjoys water aerobics exercises.  Review of Systems all other systems are noncontributory to HPI.   Objective: Vital Signs: BP 123/61   Pulse 72   Physical Exam Constitutional:      Appearance: She is well-developed.  HENT:     Head: Normocephalic.     Right Ear: External ear normal.     Left Ear: External ear normal. There is no impacted cerumen.  Eyes:     Pupils: Pupils are equal, round, and reactive to light.  Neck:     Thyroid: No thyromegaly.     Trachea: No tracheal deviation.  Cardiovascular:     Rate and Rhythm: Normal rate.  Pulmonary:     Effort: Pulmonary effort is normal.  Abdominal:     Palpations: Abdomen is soft.  Musculoskeletal:     Cervical back: No rigidity.  Skin:    General: Skin is warm and dry.  Neurological:     Mental Status: She is alert and oriented to person, place, and time.  Psychiatric:        Behavior: Behavior normal.  Ortho Exam patient is intact normal heel-toe gait she is from sitting standing comfortably.  Anterior tib quads EHL gastrocsoleus are all active.  Specialty Comments:  No specialty comments available.  Imaging: No results found.   PMFS History: Patient Active Problem List   Diagnosis Date Noted   Lumbar compression fracture (HCC) 08/31/2022   Chronic low back pain without sciatica 11/07/2020   Antiplatelet or antithrombotic long-term use 06/13/2020   Iron deficiency anemia 06/02/2020   CAD (coronary artery disease) 11/19/2019   Atypical chest pain 09/28/2019   Dyspnea on exertion 09/28/2019   H/O vitamin D deficiency 01/21/2015   Osteopenia 01/21/2015   Vaginal atrophy 01/21/2015   Mixed hyperlipidemia 02/04/2014   Family history of ovarian cancer 11/13/2012   Rectocele 11/07/2012   Hyperlipidemia 09/29/2012   Essential hypertension  08/07/2009   ALLERGIC RHINITIS 08/07/2009   HYPERSOMNIA WITH SLEEP APNEA UNSPECIFIED 08/04/2009   Past Medical History:  Diagnosis Date   Anemia    B12 deficiency    Cancer (HCC)    [facility]-  MELANOMA REMOVED FROM BACK    Coronary artery disease    Depression    Hypercholesterolemia    Hypertension     Family History  Problem Relation Age of Onset   Hypertension Mother    Heart disease Father    Diabetes Father    Cancer Sister 52       OVARIAN    Thyroid disease Sister    Heart disease Brother    Diabetes Paternal Grandmother    Thyroid disease Daughter    Colon cancer Neg Hx    Esophageal cancer Neg Hx    Rectal cancer Neg Hx    Stomach cancer Neg Hx     Past Surgical History:  Procedure Laterality Date   ABDOMINAL HYSTERECTOMY     VAGINAL HYSTERECTOMY WITH OVARIAN PRESERVATION    ABDOMINOPLASTY  1998   APPENDECTOMY     BREAST SURGERY  1985   BREAST REDUCTION    CARDIAC CATHETERIZATION  11/19/2019   CORONARY BALLOON ANGIOPLASTY N/A 11/19/2019   Procedure: CORONARY BALLOON ANGIOPLASTY;  Surgeon: Runell Gess, MD;  Location: [facility];  Service: Cardiovascular;  Laterality: N/A;   LEFT HEART CATH AND CORONARY ANGIOGRAPHY N/A 11/19/2019   Procedure: LEFT HEART CATH AND CORONARY ANGIOGRAPHY;  Surgeon: Runell Gess, MD;  Location: [facility];  Service: Cardiovascular;  Laterality: N/A;   TONSILLECTOMY AND ADENOIDECTOMY     Social History   Occupational History   Occupation: retired  Tobacco Use   Smoking status: Never   Smokeless tobacco: Never  Vaping Use   Vaping Use: Never used  Substance and Sexual Activity   Alcohol use: No    Alcohol/week: 0.0 standard drinks of alcohol   Drug use: Not on file   Sexual activity: Not Currently    Comment: 1st intercourse- 20, partners- 2, widow

## 2022-08-31 NOTE — Addendum Note (Signed)
Addended byPrescott Parma on: 08/31/2022 08:56 AM   Modules accepted: Orders

## 2022-09-13 ENCOUNTER — Other Ambulatory Visit: Payer: Self-pay | Admitting: Cardiovascular Disease

## 2022-09-13 ENCOUNTER — Other Ambulatory Visit: Payer: Self-pay | Admitting: Family

## 2022-10-14 DIAGNOSIS — N950 Postmenopausal bleeding: Secondary | ICD-10-CM | POA: Diagnosis not present

## 2022-10-14 DIAGNOSIS — M8588 Other specified disorders of bone density and structure, other site: Secondary | ICD-10-CM | POA: Diagnosis not present

## 2022-10-14 DIAGNOSIS — Z8262 Family history of osteoporosis: Secondary | ICD-10-CM | POA: Diagnosis not present

## 2022-10-14 DIAGNOSIS — E349 Endocrine disorder, unspecified: Secondary | ICD-10-CM | POA: Diagnosis not present

## 2022-10-14 LAB — HM DEXA SCAN

## 2022-10-19 ENCOUNTER — Ambulatory Visit (INDEPENDENT_AMBULATORY_CARE_PROVIDER_SITE_OTHER): Payer: Medicare Other | Admitting: Family

## 2022-10-19 ENCOUNTER — Encounter: Payer: Self-pay | Admitting: Family

## 2022-10-19 ENCOUNTER — Ambulatory Visit (HOSPITAL_BASED_OUTPATIENT_CLINIC_OR_DEPARTMENT_OTHER)
Admission: RE | Admit: 2022-10-19 | Discharge: 2022-10-19 | Disposition: A | Discharge status: Home / Self Care | Payer: Medicare Other | Source: Ambulatory Visit | Attending: Family | Admitting: Family

## 2022-10-19 VITALS — BP 116/64 | HR 64 | Resp 18 | Ht 63.0 in | Wt 147.6 lb

## 2022-10-19 DIAGNOSIS — K449 Diaphragmatic hernia without obstruction or gangrene: Secondary | ICD-10-CM | POA: Diagnosis not present

## 2022-10-19 DIAGNOSIS — M545 Low back pain, unspecified: Secondary | ICD-10-CM | POA: Diagnosis not present

## 2022-10-19 DIAGNOSIS — G8929 Other chronic pain: Secondary | ICD-10-CM

## 2022-10-19 DIAGNOSIS — R053 Chronic cough: Secondary | ICD-10-CM

## 2022-10-19 MED ORDER — METHOCARBAMOL 500 MG PO TABS: 500.0000 mg | ORAL_TABLET | Freq: Every evening | ORAL | 0 refills | Status: DC | PRN

## 2022-10-19 NOTE — Progress Notes (Signed)
Barbara Maxwell is a 87 y.o. female with the following history as recorded in EpicCare:  Patient Active Problem List   Diagnosis Date Noted   Lumbar compression fracture (HCC) 08/31/2022   Chronic low back pain without sciatica 11/07/2020   Antiplatelet or antithrombotic long-term use 06/13/2020   Iron deficiency anemia 06/02/2020   CAD (coronary artery disease) 11/19/2019   Atypical chest pain 09/28/2019   Dyspnea on exertion 09/28/2019   H/O vitamin D deficiency 01/21/2015   Osteopenia 01/21/2015   Vaginal atrophy 01/21/2015   Mixed hyperlipidemia 02/04/2014   Family history of ovarian cancer 11/13/2012   Rectocele 11/07/2012   Hyperlipidemia 09/29/2012   Essential hypertension 08/07/2009   ALLERGIC RHINITIS 08/07/2009   HYPERSOMNIA WITH SLEEP APNEA UNSPECIFIED 08/04/2009    Current Outpatient Medications  Medication Sig Dispense Refill   acetaminophen (TYLENOL) 325 MG tablet Take 325 mg by mouth every 6 (six) hours as needed for moderate pain.      amLODipine (NORVASC) 2.5 MG tablet TAKE 1 TABLET DAILY 90 tablet 3   Calcium Carbonate-Vitamin D (CALCIUM + D PO) Take 1 tablet by mouth daily.     Carboxymethylcellulose Sodium (REFRESH TEARS OP) Place 1 drop into both eyes daily.     clopidogrel (PLAVIX) 75 MG tablet TAKE 1 TABLET DAILY WITH BREAKFAST 90 tablet 3   ezetimibe (ZETIA) 10 MG tablet TAKE 1 TABLET DAILY 90 tablet 3   fenofibrate 54 MG tablet TAKE 1 TABLET(54 MG) BY MOUTH DAILY 90 tablet 3   iron polysaccharides (NIFEREX) 150 MG capsule TAKE 1 CAPSULE(150 MG) BY MOUTH DAILY 90 capsule 2   methocarbamol (ROBAXIN) 500 MG tablet Take 1 tablet (500 mg total) by mouth at bedtime as needed for muscle spasms. 30 tablet 0   metoprolol succinate (TOPROL-XL) 25 MG 24 hr tablet TAKE 1 TABLET DAILY 90 tablet 3   pantoprazole (PROTONIX) 40 MG tablet TAKE 1 TABLET TWICE A DAY BEFORE MEALS 180 tablet 1   rosuvastatin (CRESTOR) 40 MG tablet TAKE 1 TABLET DAILY 90 tablet 3    sertraline (ZOLOFT) 50 MG tablet TAKE 1 TABLET DAILY 90 tablet 3   sodium chloride (OCEAN) 0.65 % SOLN nasal spray Place 1 spray into both nostrils daily.     traMADol (ULTRAM) 50 MG tablet Take 1 tablet (50 mg total) by mouth every 6 (six) hours as needed. 30 tablet 1   nitroGLYCERIN (NITROSTAT) 0.4 MG SL tablet Place 1 tablet (0.4 mg total) under the tongue every 5 (five) minutes as needed for chest pain. 25 tablet 5   No current facility-administered medications for this visit.    Allergies: Bee venom, Simvastatin, Codeine, Evista [raloxifene], and Percocet [oxycodone-acetaminophen]  Past Medical History:  Diagnosis Date   Anemia    B12 deficiency    Cancer (HCC)    [facility]-  MELANOMA REMOVED FROM BACK    Coronary artery disease    Depression    Hypercholesterolemia    Hypertension     Past Surgical History:  Procedure Laterality Date   ABDOMINAL HYSTERECTOMY     VAGINAL HYSTERECTOMY WITH OVARIAN PRESERVATION    ABDOMINOPLASTY  1998   APPENDECTOMY     BREAST SURGERY  1985   BREAST REDUCTION    CARDIAC CATHETERIZATION  11/19/2019   CORONARY BALLOON ANGIOPLASTY N/A 11/19/2019   Procedure: CORONARY BALLOON ANGIOPLASTY;  Surgeon: Runell Gess, MD;  Location: [facility];  Service: Cardiovascular;  Laterality: N/A;   LEFT HEART CATH AND CORONARY ANGIOGRAPHY N/A 11/19/2019  Procedure: LEFT HEART CATH AND CORONARY ANGIOGRAPHY;  Surgeon: Runell Gess, MD;  Location: [facility];  Service: Cardiovascular;  Laterality: N/A;   TONSILLECTOMY AND ADENOIDECTOMY      Family History  Problem Relation Age of Onset   Hypertension Mother    Heart disease Father    Diabetes Father    Cancer Sister 55       OVARIAN    Thyroid disease Sister    Heart disease Brother    Diabetes Paternal Grandmother    Thyroid disease Daughter    Colon cancer Neg Hx    Esophageal cancer Neg Hx    Rectal cancer Neg Hx    Stomach cancer Neg Hx     Social History   Tobacco Use    Smoking status: Never   Smokeless tobacco: Never  Substance Use Topics   Alcohol use: No    Alcohol/week: 0.0 standard drinks of alcohol    Subjective:   Concerned for possible pneumonia; cough x 2 months- "burning and aching pain" in low back; has been having increased pain in back with movement; is currently taking Tylenol;    Objective:  Vitals:   10/19/22 0917  BP: 116/64  Pulse: 64  Resp: 18  SpO2: 97%  Weight: 147 lb 9.6 oz (67 kg)  Height: 5\' 3"  (1.6 m)    General: Well developed, well nourished, in no acute distress  Skin : Warm and dry.  Head: Normocephalic and atraumatic  Eyes: Sclera and conjunctiva clear; pupils round and reactive to light; extraocular movements intact  Ears: External normal; canals clear; tympanic membranes normal  Oropharynx: Pink, supple. No suspicious lesions  Neck: Supple without thyromegaly, adenopathy  Lungs: Respirations unlabored; clear to auscultation bilaterally without wheeze, rales, rhonchi  CVS exam: normal rate and regular rhythm.  Neurologic: Alert and oriented; speech intact; face symmetrical; moves all extremities well; CNII-XII intact without focal deficit   Assessment:  1. Persistent cough for 3 weeks or longer   2. Chronic low back pain without sciatica, unspecified back pain laterality     Plan:  Update CXR today; follow up to be determined; Will give trial of Robaxin to use at night and refer for PT; if symptoms persist, to consider having her follow up with orthopedics again.   No follow-ups on file.  Orders Placed This Encounter  Procedures   DG Chest 2 View    Standing Status:   Future    Number of Occurrences:   1    Standing Expiration Date:   10/19/2023    Order Specific Question:   Reason for Exam (SYMPTOM  OR DIAGNOSIS REQUIRED)    Answer:   persistent cough/ concern for pneumonia    Order Specific Question:   Preferred imaging location?    Answer:   [facility]   Ambulatory referral to Physical  Therapy    Referral Priority:   Routine    Referral Type:   Physical Medicine    Referral Reason:   Specialty Services Required    Requested Specialty:   Physical Therapy    Number of Visits Requested:   1    Requested Prescriptions   Signed Prescriptions Disp Refills   methocarbamol (ROBAXIN) 500 MG tablet 30 tablet 0    Sig: Take 1 tablet (500 mg total) by mouth at bedtime as needed for muscle spasms.

## 2022-10-22 ENCOUNTER — Other Ambulatory Visit: Payer: Self-pay

## 2022-10-22 ENCOUNTER — Ambulatory Visit: Payer: Medicare Other | Attending: Family | Admitting: Rehabilitative and Restorative Service Providers"

## 2022-10-22 ENCOUNTER — Encounter: Payer: Self-pay | Admitting: Rehabilitative and Restorative Service Providers"

## 2022-10-22 DIAGNOSIS — R2689 Other abnormalities of gait and mobility: Secondary | ICD-10-CM

## 2022-10-22 DIAGNOSIS — M5459 Other low back pain: Secondary | ICD-10-CM | POA: Diagnosis not present

## 2022-10-22 DIAGNOSIS — G8929 Other chronic pain: Secondary | ICD-10-CM | POA: Diagnosis not present

## 2022-10-22 DIAGNOSIS — R252 Cramp and spasm: Secondary | ICD-10-CM | POA: Diagnosis not present

## 2022-10-22 DIAGNOSIS — M6281 Muscle weakness (generalized): Secondary | ICD-10-CM | POA: Diagnosis not present

## 2022-10-22 DIAGNOSIS — M545 Low back pain, unspecified: Secondary | ICD-10-CM | POA: Insufficient documentation

## 2022-10-22 NOTE — Therapy (Signed)
OUTPATIENT PHYSICAL THERAPY THORACOLUMBAR EVALUATION   Patient Name: Barbara Maxwell MRN: [id number] DOB:1935/04/17, 87 y.o., female Today's Date: 10/22/2022  END OF SESSION:  PT End of Session - 10/22/22 0934     Visit Number 1    Date for PT Re-Evaluation 12/17/22    Authorization Type Medicare    Progress Note Due on Visit 10    PT Start Time 0930    PT Stop Time 1010    PT Time Calculation (min) 40 min    Activity Tolerance Patient tolerated treatment well    Behavior During Therapy WFL for tasks assessed/performed             Past Medical History:  Diagnosis Date   Anemia    B12 deficiency    Cancer (HCC)    [facility]-  MELANOMA REMOVED FROM BACK    Coronary artery disease    Depression    Hypercholesterolemia    Hypertension    Past Surgical History:  Procedure Laterality Date   ABDOMINAL HYSTERECTOMY     VAGINAL HYSTERECTOMY WITH OVARIAN PRESERVATION    ABDOMINOPLASTY  1998   APPENDECTOMY     BREAST SURGERY  1985   BREAST REDUCTION    CARDIAC CATHETERIZATION  11/19/2019   CORONARY BALLOON ANGIOPLASTY N/A 11/19/2019   Procedure: CORONARY BALLOON ANGIOPLASTY;  Surgeon: Runell Gess, MD;  [facility]: [facility];  Service: Cardiovascular;  Laterality: N/A;   LEFT HEART CATH AND CORONARY ANGIOGRAPHY N/A 11/19/2019   Procedure: LEFT HEART CATH AND CORONARY ANGIOGRAPHY;  Surgeon: Runell Gess, MD;  [facility];  Service: Cardiovascular;  Laterality: N/A;   TONSILLECTOMY AND ADENOIDECTOMY     Patient Active Problem List   Diagnosis Date Noted   Lumbar compression fracture (HCC) 08/31/2022   Chronic low back pain without sciatica 11/07/2020   Antiplatelet or antithrombotic long-term use 06/13/2020   Iron deficiency anemia 06/02/2020   CAD (coronary artery disease) 11/19/2019   Atypical chest pain 09/28/2019   Dyspnea on exertion 09/28/2019   H/O vitamin D deficiency 01/21/2015   Osteopenia 01/21/2015   Vaginal atrophy  01/21/2015   Mixed hyperlipidemia 02/04/2014   Family history of ovarian cancer 11/13/2012   Rectocele 11/07/2012   Hyperlipidemia 09/29/2012   Essential hypertension 08/07/2009   ALLERGIC RHINITIS 08/07/2009   HYPERSOMNIA WITH SLEEP APNEA UNSPECIFIED 08/04/2009    PCP: [name], FNP  REFERRING PROVIDER: [name], FNP  REFERRING DIAG: [phone] (ICD-10-CM) - Chronic low back pain without sciatica, unspecified back pain laterality  Rationale for Evaluation and Treatment: Rehabilitation  THERAPY DIAG:  Other low back pain  Other abnormalities of gait and mobility  Cramp and spasm  Muscle weakness (generalized)  ONSET DATE: a month ago  SUBJECTIVE:  SUBJECTIVE STATEMENT: Pt reports that she had been doing some mowing and raking to prepare her homes for selling before she sold them.  She states that she has been having some increased back pain since that time.  States that she is now living in an Independent Living facility with her dog.  Patient reports that she fell last night when she was pulling out a bedside commode and one of the legs caught on something and she lost her balance backwards.  PERTINENT HISTORY:  Spina Bifida, HTN, B12 Deficiency  PAIN:  Are you having pain? Yes: NPRS scale: 6/10 Pain location: right sided low back Pain description: burning, throbbing, aching Aggravating factors: too much activity Relieving factors: medication, rest  PRECAUTIONS: Fall  RED FLAGS: Compression fracture: Yes: L4     FALLS:  Has patient fallen in last 6 months? Yes. Number of falls 1 fall last night  LIVING ENVIRONMENT: Lives with: lives alone in Independent Living Lives in: House/apartment Stairs: No Has following equipment at home: Single point cane,  Walker - 4 wheeled, shower chair, bed side commode, and Grab bars  OCCUPATION: Retired  PLOF: Independent and Leisure: walking dog, water aerobics 2x/week, going out to dinner with friends, going to church and church activities  PATIENT GOALS: To decrease pain and be able to do desired activities  NEXT MD VISIT: Dr [name] on Feb 14, 2023  OBJECTIVE:   DIAGNOSTIC FINDINGS:  Lumbar MRI on 07/25/2022: IMPRESSION: 1. Acute or subacute L4 compression fracture with 35% height loss. 2. Diffuse lumbar disc and facet degeneration without spinal stenosis. 3. Moderate neural foraminal stenosis at L3-4 and L5-S1.  PATIENT SURVEYS:  FOTO 57 (projected 45 by visit 10)   COGNITION: Overall cognitive status: Within functional limits for tasks assessed     SENSATION: Pt states that she sometimes has some numbness and tingling in her right left arm  MUSCLE LENGTH: Hamstrings: WFL  POSTURE: rounded shoulders  PALPATION: Tightness/muscle spasms noted on bilateral lumbar paraspinals, especially right  LUMBAR ROM:   Eval:  Limited with right lumbar rotation with increased pain.  LOWER EXTREMITY ROM:     WFL  LOWER EXTREMITY MMT:    Eval:  Right hip strength grossly 4/5 Left hip strength grossly 4+/5 Bilat quads WFL Bilat hamstrings 4+/5  LUMBAR SPECIAL TESTS:  Eval:  Slump test: Positive on right  FUNCTIONAL TESTS:  Eval: 5 times sit to stand: 11.2 sec Single leg Stance: right- 1.68 sec, left- 1.45 sec   GAIT: Distance walked: > 500 ft Assistive device utilized: None Level of assistance: Complete Independence Comments: Pt with antalgic gait, but states that she has had more pain since her fall last night  TODAY'S TREATMENT:                                                                                                                              DATE: 10/22/2022  Reviewed HEP and discussed PT plan of care/goals  PATIENT EDUCATION:  Education details: Issued  HEP Person educated: Patient Education method: Explanation, Facilities manager, and Handouts Education comprehension: verbalized understanding  HOME EXERCISE PROGRAM: Access Code: [email or web address] URL: [email or web address] Date: 10/22/2022 Prepared by: Reather Laurence  Exercises - Standing Single Leg Stance with Counter Support  - 1 x daily - 7 x weekly - 2 reps - 20 sec hold - Standing Tandem Balance with Counter Support  - 1 x daily - 7 x weekly - 2 reps - 20 sec hold - Standing March with Counter Support  - 1 x daily - 7 x weekly - 2 sets - 10 reps - Standing Hip Abduction with Counter Support  - 1 x daily - 7 x weekly - 2 sets - 10 reps - Standing Hip Extension with Counter Support  - 1 x daily - 7 x weekly - 2 sets - 10 reps  ASSESSMENT:  CLINICAL IMPRESSION: Patient is a 87 y.o. female who was seen today for physical therapy evaluation and treatment for low back pain. Patient lives in an Independent Living facility and states that she participates in water aerobics 2x/week.  Pt with a fall last night and reports some right sided back pain as a result.  Pt with some bruising to her left arm, but denies any injury that requires medical attention.  Patient presents with muscle weakness, decreased balance, increased pain, and muscle spasms.  Patient would benefit from skilled PT to address her functional impairments and decreased pain with daily activities.  OBJECTIVE IMPAIRMENTS: decreased balance, decreased ROM, decreased strength, increased muscle spasms, postural dysfunction, and pain.   ACTIVITY LIMITATIONS: carrying, lifting, bending, sleeping, stairs, and reach over head  PARTICIPATION LIMITATIONS: cleaning, shopping, community activity, and church  PERSONAL FACTORS: Time since onset of injury/illness/exacerbation and 1-2 comorbidities: Osteopenia, Lumbar Compression Fx at L4  are also affecting patient's functional outcome.   REHAB POTENTIAL: Good  CLINICAL DECISION MAKING:  Stable/uncomplicated  EVALUATION COMPLEXITY: Low   GOALS: Goals reviewed with patient? Yes  SHORT TERM GOALS: Target date: 11/12/2022  Pt will be independent with initial HEP. Baseline: Goal status: INITIAL  2.  Pt will report at least a 30% improvement in pain/symptoms since initial evaluation. Baseline:  Goal status: INITIAL    LONG TERM GOALS: Target date: 12/17/2022  Pt will be independent with advanced HEP to allow for self-progression. Baseline:  Goal status: INITIAL  2.  Pt will increase FOTO to at least 67 to demonstrate improvements in functional mobility. Baseline:  Goal status: INITIAL  3.  Pt will increase right hip strength to at least 4+/5 to allow her for perform community tasks with increased ease. Baseline:  Goal status: INITIAL  4.  Pt will report ability to walk her dog for greater than 15-20 minutes without any increased pain or loss of balance. Baseline:  Goal status: INITIAL  5.  Pt will increase single leg stance time at greater than 10 sec on either leg to demonstrate decreased risk of falling. Baseline:  Goal status: INITIAL    PLAN:  PT FREQUENCY: 2x/week  PT DURATION: 8 weeks  PLANNED INTERVENTIONS: Therapeutic exercises, Therapeutic activity, Neuromuscular re-education, Balance training, Gait training, Patient/Family education, Self Care, Joint mobilization, Joint manipulation, Stair training, Aquatic Therapy, Dry Needling, Electrical stimulation, Spinal manipulation, Spinal mobilization, Cryotherapy, Moist heat, Taping, Traction, Ultrasound, Ionotophoresis 4mg /ml Dexamethasone, Manual therapy, and Re-evaluation.  PLAN FOR NEXT SESSION: Assess and progress HEP as indicated, strengthening, flexibility, core stability   Shanyiah Conde, PT 10/22/2022, 9:36 AM   [facility] [address] [place], [place] [address] Phone # [phone] Fax [phone]

## 2022-11-03 ENCOUNTER — Ambulatory Visit: Payer: Medicare Other | Attending: Family

## 2022-11-03 DIAGNOSIS — R262 Difficulty in walking, not elsewhere classified: Secondary | ICD-10-CM | POA: Insufficient documentation

## 2022-11-03 DIAGNOSIS — R252 Cramp and spasm: Secondary | ICD-10-CM | POA: Diagnosis not present

## 2022-11-03 DIAGNOSIS — R2689 Other abnormalities of gait and mobility: Secondary | ICD-10-CM | POA: Diagnosis not present

## 2022-11-03 DIAGNOSIS — M5459 Other low back pain: Secondary | ICD-10-CM | POA: Diagnosis not present

## 2022-11-03 DIAGNOSIS — M6281 Muscle weakness (generalized): Secondary | ICD-10-CM | POA: Diagnosis not present

## 2022-11-03 NOTE — Therapy (Signed)
OUTPATIENT PHYSICAL THERAPY THORACOLUMBAR EVALUATION   Patient Name: Barbara Maxwell MRN: [id number] DOB:1935-12-23, 87 y.o., female Today's Date: 11/03/2022  END OF SESSION:  PT End of Session - 11/03/22 1142     Visit Number 2    Date for PT Re-Evaluation 12/17/22    Authorization Type Medicare    Progress Note Due on Visit 10    PT Start Time 1143    PT Stop Time 1227    PT Time Calculation (min) 44 min    Activity Tolerance Patient tolerated treatment well    Behavior During Therapy WFL for tasks assessed/performed             Past Medical History:  Diagnosis Date   Anemia    B12 deficiency    Cancer (HCC)    [facility]-  MELANOMA REMOVED FROM BACK    Coronary artery disease    Depression    Hypercholesterolemia    Hypertension    Past Surgical History:  Procedure Laterality Date   ABDOMINAL HYSTERECTOMY     VAGINAL HYSTERECTOMY WITH OVARIAN PRESERVATION    ABDOMINOPLASTY  1998   APPENDECTOMY     BREAST SURGERY  1985   BREAST REDUCTION    CARDIAC CATHETERIZATION  11/19/2019   CORONARY BALLOON ANGIOPLASTY N/A 11/19/2019   Procedure: CORONARY BALLOON ANGIOPLASTY;  Surgeon: Runell Gess, MD;  Location: [facility];  Service: Cardiovascular;  Laterality: N/A;   LEFT HEART CATH AND CORONARY ANGIOGRAPHY N/A 11/19/2019   Procedure: LEFT HEART CATH AND CORONARY ANGIOGRAPHY;  Surgeon: Runell Gess, MD;  Location: [facility];  Service: Cardiovascular;  Laterality: N/A;   TONSILLECTOMY AND ADENOIDECTOMY     Patient Active Problem List   Diagnosis Date Noted   Lumbar compression fracture (HCC) 08/31/2022   Chronic low back pain without sciatica 11/07/2020   Antiplatelet or antithrombotic long-term use 06/13/2020   Iron deficiency anemia 06/02/2020   CAD (coronary artery disease) 11/19/2019   Atypical chest pain 09/28/2019   Dyspnea on exertion 09/28/2019   H/O vitamin D deficiency 01/21/2015   Osteopenia 01/21/2015   Vaginal atrophy  01/21/2015   Mixed hyperlipidemia 02/04/2014   Family history of ovarian cancer 11/13/2012   Rectocele 11/07/2012   Hyperlipidemia 09/29/2012   Essential hypertension 08/07/2009   ALLERGIC RHINITIS 08/07/2009   HYPERSOMNIA WITH SLEEP APNEA UNSPECIFIED 08/04/2009    PCP: [name], FNP  REFERRING PROVIDER: [name], FNP  REFERRING DIAG: [phone] (ICD-10-CM) - Chronic low back pain without sciatica, unspecified back pain laterality  Rationale for Evaluation and Treatment: Rehabilitation  THERAPY DIAG:  Other low back pain  Cramp and spasm  Muscle weakness (generalized)  Difficulty in walking, not elsewhere classified  ONSET DATE: a month ago  SUBJECTIVE:  SUBJECTIVE STATEMENT: Pt reports she is doing good but continued pain in low back area.   PERTINENT HISTORY:  Spina Bifida, HTN, B12 Deficiency  PAIN:  Are you having pain? Yes: NPRS scale: 6/10 Pain location: right sided low back Pain description: burning, throbbing, aching Aggravating factors: too much activity Relieving factors: medication, rest  PRECAUTIONS: Fall  RED FLAGS: Compression fracture: Yes: L4     FALLS:  Has patient fallen in last 6 months? Yes. Number of falls 1 fall last night  LIVING ENVIRONMENT: Lives with: lives alone in Independent Living Lives in: House/apartment Stairs: No Has following equipment at home: Single point cane, Walker - 4 wheeled, shower chair, bed side commode, and Grab bars  OCCUPATION: Retired  PLOF: Independent and Leisure: walking dog, water aerobics 2x/week, going out to dinner with friends, going to church and church activities  PATIENT GOALS: To decrease pain and be able to do desired activities  NEXT MD VISIT: Dr [name] on Feb 14, 2023  OBJECTIVE:   DIAGNOSTIC FINDINGS:  Lumbar MRI on 07/25/2022: IMPRESSION: 1. Acute or subacute L4 compression fracture with 35% height loss. 2. Diffuse lumbar disc and facet degeneration without spinal stenosis. 3. Moderate neural foraminal stenosis at L3-4 and L5-S1.  PATIENT SURVEYS:  FOTO 57 (projected 25 by visit 10)   COGNITION: Overall cognitive status: Within functional limits for tasks assessed     SENSATION: Pt states that she sometimes has some numbness and tingling in her right left arm  MUSCLE LENGTH: Hamstrings: WFL  POSTURE: rounded shoulders  PALPATION: Tightness/muscle spasms noted on bilateral lumbar paraspinals, especially right  LUMBAR ROM:   Eval:  Limited with right lumbar rotation with increased pain.  LOWER EXTREMITY ROM:     WFL  LOWER EXTREMITY MMT:    Eval:  Right hip strength grossly 4/5 Left hip strength grossly 4+/5 Bilat quads WFL Bilat hamstrings 4+/5  LUMBAR SPECIAL TESTS:  Eval:  Slump test: Positive on right  FUNCTIONAL TESTS:  Eval: 5 times sit to stand: 11.2 sec Single leg Stance: right- 1.68 sec, left- 1.45 sec   GAIT: Distance walked: > 500 ft Assistive device utilized: None Level of assistance: Complete Independence Comments: Pt with antalgic gait, but states that she has had more pain since her fall last night  TODAY'S TREATMENT:                                                                                                                              DATE: 11/03/2022 Nustep x 5 min level 5 Standing hamstring stretch 2 x 30 sec each LE Standing quad/hip flexor stretch 2 x 30 sec each LE Supine PPT x 20 PPT with 90/90 heel tap x 20 PPT with trunk rotation x 20 Seated piriformis stretch 3 x 30 sec each LE   DATE: 10/22/2022  Reviewed HEP and discussed PT plan of care/goals   PATIENT EDUCATION:  Education details: Issued HEP Person educated: Patient Education  method: Explanation, Demonstration, and  Handouts Education comprehension: verbalized understanding  HOME EXERCISE PROGRAM: Access Code: [email or web address] URL: [email or web address] Date: 10/22/2022 Prepared by: Reather Laurence  Exercises - Standing Single Leg Stance with Counter Support  - 1 x daily - 7 x weekly - 2 reps - 20 sec hold - Standing Tandem Balance with Counter Support  - 1 x daily - 7 x weekly - 2 reps - 20 sec hold - Standing March with Counter Support  - 1 x daily - 7 x weekly - 2 sets - 10 reps - Standing Hip Abduction with Counter Support  - 1 x daily - 7 x weekly - 2 sets - 10 reps - Standing Hip Extension with Counter Support  - 1 x daily - 7 x weekly - 2 sets - 10 reps  ASSESSMENT:  CLINICAL IMPRESSION: Elleni did well with today's tasks.  She had most of her discomfort with the standing stretches but this was minimal.  She was able to tolerate addition of core strength today without increased pain.  Encouraged her to use ice to low back once she got home if she was feeling any soreness.    Patient would benefit from continuing skilled PT to address her functional impairments and decreased pain with daily activities.  OBJECTIVE IMPAIRMENTS: decreased balance, decreased ROM, decreased strength, increased muscle spasms, postural dysfunction, and pain.   ACTIVITY LIMITATIONS: carrying, lifting, bending, sleeping, stairs, and reach over head  PARTICIPATION LIMITATIONS: cleaning, shopping, community activity, and church  PERSONAL FACTORS: Time since onset of injury/illness/exacerbation and 1-2 comorbidities: Osteopenia, Lumbar Compression Fx at L4  are also affecting patient's functional outcome.   REHAB POTENTIAL: Good  CLINICAL DECISION MAKING: Stable/uncomplicated  EVALUATION COMPLEXITY: Low   GOALS: Goals reviewed with patient? Yes  SHORT TERM GOALS: Target date: 11/12/2022  Pt will be independent with initial HEP. Baseline: Goal status: INITIAL  2.  Pt will report at least a 30% improvement  in pain/symptoms since initial evaluation. Baseline:  Goal status: INITIAL    LONG TERM GOALS: Target date: 12/17/2022  Pt will be independent with advanced HEP to allow for self-progression. Baseline:  Goal status: INITIAL  2.  Pt will increase FOTO to at least 67 to demonstrate improvements in functional mobility. Baseline:  Goal status: INITIAL  3.  Pt will increase right hip strength to at least 4+/5 to allow her for perform community tasks with increased ease. Baseline:  Goal status: INITIAL  4.  Pt will report ability to walk her dog for greater than 15-20 minutes without any increased pain or loss of balance. Baseline:  Goal status: INITIAL  5.  Pt will increase single leg stance time at greater than 10 sec on either leg to demonstrate decreased risk of falling. Baseline:  Goal status: INITIAL    PLAN:  PT FREQUENCY: 2x/week  PT DURATION: 8 weeks  PLANNED INTERVENTIONS: Therapeutic exercises, Therapeutic activity, Neuromuscular re-education, Balance training, Gait training, Patient/Family education, Self Care, Joint mobilization, Joint manipulation, Stair training, Aquatic Therapy, Dry Needling, Electrical stimulation, Spinal manipulation, Spinal mobilization, Cryotherapy, Moist heat, Taping, Traction, Ultrasound, Ionotophoresis 4mg /ml Dexamethasone, Manual therapy, and Re-evaluation.  PLAN FOR NEXT SESSION: Progress HEP as indicated, strengthening, flexibility, core stability   Judi Jaffe B. Audris Speaker, PT 11/03/22 1:22 PM [facility] [address] [place], [place] [address] Phone # [phone] Fax [phone]

## 2022-11-04 NOTE — Therapy (Signed)
OUTPATIENT PHYSICAL THERAPY THORACOLUMBAR TREATMENT   Patient Name: Barbara Maxwell MRN: [id number] DOB:03-Jun-1935, 87 y.o., female Today's Date: 11/05/2022  END OF SESSION:  PT End of Session - 11/05/22 1013     Visit Number 3    Date for PT Re-Evaluation 12/17/22    Authorization Type Medicare    Progress Note Due on Visit 10    PT Start Time 1011    PT Stop Time 1055    PT Time Calculation (min) 44 min    Activity Tolerance Patient tolerated treatment well    Behavior During Therapy WFL for tasks assessed/performed              Past Medical History:  Diagnosis Date   Anemia    B12 deficiency    Cancer (HCC)    [facility]-  MELANOMA REMOVED FROM BACK    Coronary artery disease    Depression    Hypercholesterolemia    Hypertension    Past Surgical History:  Procedure Laterality Date   ABDOMINAL HYSTERECTOMY     VAGINAL HYSTERECTOMY WITH OVARIAN PRESERVATION    ABDOMINOPLASTY  1998   APPENDECTOMY     BREAST SURGERY  1985   BREAST REDUCTION    CARDIAC CATHETERIZATION  11/19/2019   CORONARY BALLOON ANGIOPLASTY N/A 11/19/2019   Procedure: CORONARY BALLOON ANGIOPLASTY;  Surgeon: Runell Gess, MD;  Location: [facility];  Service: Cardiovascular;  Laterality: N/A;   LEFT HEART CATH AND CORONARY ANGIOGRAPHY N/A 11/19/2019   Procedure: LEFT HEART CATH AND CORONARY ANGIOGRAPHY;  Surgeon: Runell Gess, MD;  Location: [facility];  Service: Cardiovascular;  Laterality: N/A;   TONSILLECTOMY AND ADENOIDECTOMY     Patient Active Problem List   Diagnosis Date Noted   Lumbar compression fracture (HCC) 08/31/2022   Chronic low back pain without sciatica 11/07/2020   Antiplatelet or antithrombotic long-term use 06/13/2020   Iron deficiency anemia 06/02/2020   CAD (coronary artery disease) 11/19/2019   Atypical chest pain 09/28/2019   Dyspnea on exertion 09/28/2019   H/O vitamin D deficiency 01/21/2015   Osteopenia 01/21/2015   Vaginal atrophy  01/21/2015   Mixed hyperlipidemia 02/04/2014   Family history of ovarian cancer 11/13/2012   Rectocele 11/07/2012   Hyperlipidemia 09/29/2012   Essential hypertension 08/07/2009   ALLERGIC RHINITIS 08/07/2009   HYPERSOMNIA WITH SLEEP APNEA UNSPECIFIED 08/04/2009    PCP: [name], FNP  REFERRING PROVIDER: [name], FNP  REFERRING DIAG: [phone] (ICD-10-CM) - Chronic low back pain without sciatica, unspecified back pain laterality  Rationale for Evaluation and Treatment: Rehabilitation  THERAPY DIAG:  Other low back pain  Cramp and spasm  Muscle weakness (generalized)  Difficulty in walking, not elsewhere classified  Other abnormalities of gait and mobility  ONSET DATE: a month ago  SUBJECTIVE:  SUBJECTIVE STATEMENT: I have a little twinge in the right hip.    PERTINENT HISTORY:  Spina Bifida, HTN, B12 Deficiency  PAIN:  Are you having pain? Yes: NPRS scale: 5/10 Pain location: right sided low back Pain description: burning, throbbing, aching Aggravating factors: too much activity Relieving factors: medication, rest  PRECAUTIONS: Fall  RED FLAGS: Compression fracture: Yes: L4     FALLS:  Has patient fallen in last 6 months? Yes. Number of falls 1 fall last night  LIVING ENVIRONMENT: Lives with: lives alone in Independent Living Lives in: House/apartment Stairs: No Has following equipment at home: Single point cane, Walker - 4 wheeled, shower chair, bed side commode, and Grab bars  OCCUPATION: Retired  PLOF: Independent and Leisure: walking dog, water aerobics 2x/week, going out to dinner with friends, going to church and church activities  PATIENT GOALS: To decrease pain and be able to do desired activities  NEXT MD VISIT: Dr [name] on Feb 14, 2023  OBJECTIVE:   DIAGNOSTIC FINDINGS:  Lumbar MRI on 07/25/2022: IMPRESSION: 1. Acute or subacute L4 compression fracture with 35% height loss. 2. Diffuse lumbar disc and facet degeneration without spinal stenosis. 3. Moderate neural foraminal stenosis at L3-4 and L5-S1.  PATIENT SURVEYS:  FOTO 57 (projected 20 by visit 10)   COGNITION: Overall cognitive status: Within functional limits for tasks assessed     SENSATION: Pt states that she sometimes has some numbness and tingling in her right left arm  MUSCLE LENGTH: Hamstrings: WFL  POSTURE: rounded shoulders  PALPATION: Tightness/muscle spasms noted on bilateral lumbar paraspinals, especially right  LUMBAR ROM:   Eval:  Limited with right lumbar rotation with increased pain.  LOWER EXTREMITY ROM:     WFL  LOWER EXTREMITY MMT:    Eval:  Right hip strength grossly 4/5 Left hip strength grossly 4+/5 Bilat quads WFL Bilat hamstrings 4+/5  LUMBAR SPECIAL TESTS:  Eval:  Slump test: Positive on right  FUNCTIONAL TESTS:  Eval: 5 times sit to stand: 11.2 sec Single leg Stance: right- 1.68 sec, left- 1.45 sec   GAIT: Distance walked: > 500 ft Assistive device utilized: None Level of assistance: Complete Independence Comments: Pt with antalgic gait, but states that she has had more pain since her fall last night  TODAY'S TREATMENT:                                                                                                                              DATE:    11/05/2022 Nustep x 5 min level 5 Supine PPT x 20 PPT with 90/90 heel tap x 20 some pain in back at end Bridge with PPT x 10 Seated piriformis stretch 3 x 30 sec each LE Standing hamstring stretch 2 x 30 sec each LE Standing quad/hip flexor stretch 2 x 30 sec each LE Self MFR to R lumbar and thoracic paraspinals Standing row red tband x 10 Standing horizontal abd red tband x 10  Manual: IASTM and STM to R T-L paraspinals and low  traps  11/03/2022 Nustep x 5 min level 5 Standing hamstring stretch 2 x 30 sec each LE Standing quad/hip flexor stretch 2 x 30 sec each LE Supine PPT x 20 PPT with 90/90 heel tap x 20 PPT with trunk rotation x 20 Seated piriformis stretch 3 x 30 sec each LE   DATE: 10/22/2022  Reviewed HEP and discussed PT plan of care/goals   PATIENT EDUCATION:  Education details: Issued HEP Person educated: Patient Education method: Explanation, Demonstration, and Handouts Education comprehension: verbalized understanding  HOME EXERCISE PROGRAM: Access Code: [email or web address] URL: [email or web address] Date: 10/22/2022 Prepared by: Reather Laurence  Exercises - Standing Single Leg Stance with Counter Support  - 1 x daily - 7 x weekly - 2 reps - 20 sec hold - Standing Tandem Balance with Counter Support  - 1 x daily - 7 x weekly - 2 reps - 20 sec hold - Standing March with Counter Support  - 1 x daily - 7 x weekly - 2 sets - 10 reps - Standing Hip Abduction with Counter Support  - 1 x daily - 7 x weekly - 2 sets - 10 reps - Standing Hip Extension with Counter Support  - 1 x daily - 7 x weekly - 2 sets - 10 reps  ASSESSMENT:  CLINICAL IMPRESSION: Barbara Maxwell reports some improvement in back pain today. She is feeling it mainly in the R low traps region. She responded well to manual therapy with decreased tissue tension after. Self MFR technique with ball taught to pt for home. She had some pain with heel taps, but immediate relief with bridging. Barbara Maxwell will benefit from ongoing core and upper back strengthening.  OBJECTIVE IMPAIRMENTS: decreased balance, decreased ROM, decreased strength, increased muscle spasms, postural dysfunction, and pain.   ACTIVITY LIMITATIONS: carrying, lifting, bending, sleeping, stairs, and reach over head  PARTICIPATION LIMITATIONS: cleaning, shopping, community activity, and church  PERSONAL FACTORS: Time since onset of injury/illness/exacerbation and 1-2  comorbidities: Osteopenia, Lumbar Compression Fx at L4  are also affecting patient's functional outcome.   REHAB POTENTIAL: Good  CLINICAL DECISION MAKING: Stable/uncomplicated  EVALUATION COMPLEXITY: Low   GOALS: Goals reviewed with patient? Yes  SHORT TERM GOALS: Target date: 11/12/2022  Pt will be independent with initial HEP. Baseline: Goal status: INITIAL  2.  Pt will report at least a 30% improvement in pain/symptoms since initial evaluation. Baseline:  Goal status: INITIAL    LONG TERM GOALS: Target date: 12/17/2022  Pt will be independent with advanced HEP to allow for self-progression. Baseline:  Goal status: INITIAL  2.  Pt will increase FOTO to at least 67 to demonstrate improvements in functional mobility. Baseline:  Goal status: INITIAL  3.  Pt will increase right hip strength to at least 4+/5 to allow her for perform community tasks with increased ease. Baseline:  Goal status: INITIAL  4.  Pt will report ability to walk her dog for greater than 15-20 minutes without any increased pain or loss of balance. Baseline:  Goal status: INITIAL  5.  Pt will increase single leg stance time at greater than 10 sec on either leg to demonstrate decreased risk of falling. Baseline:  Goal status: INITIAL    PLAN:  PT FREQUENCY: 2x/week  PT DURATION: 8 weeks  PLANNED INTERVENTIONS: Therapeutic exercises, Therapeutic activity, Neuromuscular re-education, Balance training, Gait training, Patient/Family education, Self Care, Joint mobilization, Joint manipulation, Stair training, Aquatic Therapy, Dry Needling, Electrical stimulation, Spinal manipulation, Spinal  mobilization, Cryotherapy, Moist heat, Taping, Traction, Ultrasound, Ionotophoresis 4mg /ml Dexamethasone, Manual therapy, and Re-evaluation.  PLAN FOR NEXT SESSION: Progress HEP as indicated, strengthening, flexibility, core stability   Solon Palm, PT  11/05/22 11:54 AM [facility] [address] [place], [place] [address] Phone # [phone] Fax [phone]

## 2022-11-05 ENCOUNTER — Ambulatory Visit: Payer: Medicare Other | Admitting: Physical Therapy

## 2022-11-05 ENCOUNTER — Encounter: Payer: Self-pay | Admitting: Physical Therapy

## 2022-11-05 DIAGNOSIS — R2689 Other abnormalities of gait and mobility: Secondary | ICD-10-CM

## 2022-11-05 DIAGNOSIS — M5459 Other low back pain: Secondary | ICD-10-CM | POA: Diagnosis not present

## 2022-11-05 DIAGNOSIS — R252 Cramp and spasm: Secondary | ICD-10-CM

## 2022-11-05 DIAGNOSIS — M6281 Muscle weakness (generalized): Secondary | ICD-10-CM

## 2022-11-05 DIAGNOSIS — R262 Difficulty in walking, not elsewhere classified: Secondary | ICD-10-CM

## 2022-11-09 ENCOUNTER — Ambulatory Visit: Payer: Medicare Other | Admitting: Physical Therapy

## 2022-11-09 ENCOUNTER — Encounter: Payer: Self-pay | Admitting: Physical Therapy

## 2022-11-09 DIAGNOSIS — M6281 Muscle weakness (generalized): Secondary | ICD-10-CM

## 2022-11-09 DIAGNOSIS — R262 Difficulty in walking, not elsewhere classified: Secondary | ICD-10-CM

## 2022-11-09 DIAGNOSIS — R252 Cramp and spasm: Secondary | ICD-10-CM | POA: Diagnosis not present

## 2022-11-09 DIAGNOSIS — M5459 Other low back pain: Secondary | ICD-10-CM

## 2022-11-09 DIAGNOSIS — R2689 Other abnormalities of gait and mobility: Secondary | ICD-10-CM | POA: Diagnosis not present

## 2022-11-09 NOTE — Therapy (Addendum)
++++++++++++++9 OUTPATIENT PHYSICAL THERAPY THORACOLUMBAR TREATMENT   Patient Name: Barbara Maxwell MRN: [id number] DOB:February 07, 1936, 87 y.o., female Today's Date: 11/09/2022  END OF SESSION:  PT End of Session - 11/09/22 1318     Visit Number 4    Date for PT Re-Evaluation 12/17/22    Authorization Type Medicare    Progress Note Due on Visit 10    PT Start Time 1230    PT Stop Time 1318    PT Time Calculation (min) 48 min    Activity Tolerance Patient tolerated treatment well    Behavior During Therapy WFL for tasks assessed/performed               Past Medical History:  Diagnosis Date   Anemia    B12 deficiency    Cancer (HCC)    [facility]-  MELANOMA REMOVED FROM BACK    Coronary artery disease    Depression    Hypercholesterolemia    Hypertension    Past Surgical History:  Procedure Laterality Date   ABDOMINAL HYSTERECTOMY     VAGINAL HYSTERECTOMY WITH OVARIAN PRESERVATION    ABDOMINOPLASTY  1998   APPENDECTOMY     BREAST SURGERY  1985   BREAST REDUCTION    CARDIAC CATHETERIZATION  11/19/2019   CORONARY BALLOON ANGIOPLASTY N/A 11/19/2019   Procedure: CORONARY BALLOON ANGIOPLASTY;  Surgeon: Runell Gess, MD;  Location: [facility];  Service: Cardiovascular;  Laterality: N/A;   LEFT HEART CATH AND CORONARY ANGIOGRAPHY N/A 11/19/2019   Procedure: LEFT HEART CATH AND CORONARY ANGIOGRAPHY;  Surgeon: Runell Gess, MD;  Location: [facility];  Service: Cardiovascular;  Laterality: N/A;   TONSILLECTOMY AND ADENOIDECTOMY     Patient Active Problem List   Diagnosis Date Noted   Lumbar compression fracture (HCC) 08/31/2022   Chronic low back pain without sciatica 11/07/2020   Antiplatelet or antithrombotic long-term use 06/13/2020   Iron deficiency anemia 06/02/2020   CAD (coronary artery disease) 11/19/2019   Atypical chest pain 09/28/2019   Dyspnea on exertion 09/28/2019   H/O vitamin D deficiency 01/21/2015   Osteopenia 01/21/2015    Vaginal atrophy 01/21/2015   Mixed hyperlipidemia 02/04/2014   Family history of ovarian cancer 11/13/2012   Rectocele 11/07/2012   Hyperlipidemia 09/29/2012   Essential hypertension 08/07/2009   ALLERGIC RHINITIS 08/07/2009   HYPERSOMNIA WITH SLEEP APNEA UNSPECIFIED 08/04/2009    PCP: [name], FNP  REFERRING PROVIDER: [name], FNP  REFERRING DIAG: [phone] (ICD-10-CM) - Chronic low back pain without sciatica, unspecified back pain laterality  Rationale for Evaluation and Treatment: Rehabilitation  THERAPY DIAG:  Other low back pain  Cramp and spasm  Muscle weakness (generalized)  Difficulty in walking, not elsewhere classified  Other abnormalities of gait and mobility  ONSET DATE: a month ago  SUBJECTIVE:  SUBJECTIVE STATEMENT: Did water aerobics this morning so I'm tired.    PERTINENT HISTORY:  Spina Bifida, HTN, B12 Deficiency  PAIN:  Are you having pain? Yes: NPRS scale: 6.5/10 Pain location: right sided low back Pain description: burning, throbbing, aching Aggravating factors: too much activity Relieving factors: medication, rest  PRECAUTIONS: Fall  RED FLAGS: Compression fracture: Yes: L4     FALLS:  Has patient fallen in last 6 months? Yes. Number of falls 1 fall last night  LIVING ENVIRONMENT: Lives with: lives alone in Independent Living Lives in: House/apartment Stairs: No Has following equipment at home: Single point cane, Walker - 4 wheeled, shower chair, bed side commode, and Grab bars  OCCUPATION: Retired  PLOF: Independent and Leisure: walking dog, water aerobics 2x/week, going out to dinner with friends, going to church and church activities  PATIENT GOALS: To decrease pain and be able to do desired activities  NEXT MD  VISIT: Dr [name] on Feb 14, 2023  OBJECTIVE:   DIAGNOSTIC FINDINGS:  Lumbar MRI on 07/25/2022: IMPRESSION: 1. Acute or subacute L4 compression fracture with 35% height loss. 2. Diffuse lumbar disc and facet degeneration without spinal stenosis. 3. Moderate neural foraminal stenosis at L3-4 and L5-S1.  PATIENT SURVEYS:  FOTO 57 (projected 39 by visit 10)   COGNITION: Overall cognitive status: Within functional limits for tasks assessed     SENSATION: Pt states that she sometimes has some numbness and tingling in her right left arm  MUSCLE LENGTH: Hamstrings: WFL  POSTURE: rounded shoulders  PALPATION: Tightness/muscle spasms noted on bilateral lumbar paraspinals, especially right  LUMBAR ROM:   Eval:  Limited with right lumbar rotation with increased pain.  LOWER EXTREMITY ROM:     WFL  LOWER EXTREMITY MMT:    Eval:  Right hip strength grossly 4/5 Left hip strength grossly 4+/5 Bilat quads WFL Bilat hamstrings 4+/5  LUMBAR SPECIAL TESTS:  Eval:  Slump test: Positive on right  FUNCTIONAL TESTS:  Eval: 5 times sit to stand: 11.2 sec Single leg Stance: right- 1.68 sec, left- 1.45 sec   GAIT: Distance walked: > 500 ft Assistive device utilized: None Level of assistance: Complete Independence Comments: Pt with antalgic gait, but states that she has had more pain since her fall last night  TODAY'S TREATMENT:                                                                                                                              DATE:  11/09/2022 Nustep x 5 min level 5 Standing row red tband x 10 Standing horizontal abd red tband x 10 Standing hip ABD and ext x 14 B Tandem stance B max hold; tandem walking Star taps SLS B x 4 ea; also with no tap when able SLS at [facility] - multiple reps Manual: IASTM and STM to B T-L paraspinals. TPR to L QL in sidelying.  11/05/2022 Nustep x 5 min level 5 Supine PPT x 20 PPT  with 90/90 heel tap x 20 some pain in back at  end [place] with PPT x 10 Seated piriformis stretch 3 x 30 sec each LE Standing hamstring stretch 2 x 30 sec each LE Standing quad/hip flexor stretch 2 x 30 sec each LE Self MFR to R lumbar and thoracic paraspinals Standing row red tband x 10 Standing horizontal abd red tband x 10 Manual: IASTM and STM to R T-L paraspinals and low traps  11/03/2022 Nustep x 5 min level 5 Standing hamstring stretch 2 x 30 sec each LE Standing quad/hip flexor stretch 2 x 30 sec each LE Supine PPT x 20 PPT with 90/90 heel tap x 20 PPT with trunk rotation x 20 Seated piriformis stretch 3 x 30 sec each LE   DATE: 10/22/2022  Reviewed HEP and discussed PT plan of care/goals   PATIENT EDUCATION:  Education details: HEP update Person educated: Patient Education method: Explanation, Demonstration, and Handouts Education comprehension: verbalized understanding  HOME EXERCISE PROGRAM: Access Code: [email or web address] URL: [email or web address] Date: 11/09/2022 Prepared by: Raynelle Fanning  Exercises - Standing Single Leg Stance with Counter Support  - 1 x daily - 7 x weekly - 2 reps - 20 sec hold - Standing Tandem Balance with Counter Support  - 1 x daily - 7 x weekly - 2 reps - 20 sec hold - Standing March with Counter Support  - 1 x daily - 7 x weekly - 2 sets - 10 reps - Standing Hip Abduction with Counter Support  - 1 x daily - 7 x weekly - 2 sets - 10 reps - Standing Hip Extension with Counter Support  - 1 x daily - 7 x weekly - 2 sets - 10 reps - Standing Bilateral Low Shoulder Row with Anchored Resistance  - 1 x daily - 3 x weekly - 2 sets - 10 reps - Standing Shoulder Horizontal Abduction with Resistance  - 1 x daily - 3 x weekly - 2 sets - 10 reps - Shoulder extension with resistance - Neutral  - 1 x daily - 3 x weekly - 2 sets - 10 reps  ASSESSMENT:  CLINICAL IMPRESSION: Nyesha did well with all TE with no complaints of back pain. She does get dizzy when looking down during exercises, especially  tandem gait. This improves when looking forward. She had more pain in the left thoracic spine today versus right. Good response to MT. She did have some right hip pain upon standing. She would benefit from MT and/or DN to the right hip next visit.   OBJECTIVE IMPAIRMENTS: decreased balance, decreased ROM, decreased strength, increased muscle spasms, postural dysfunction, and pain.   ACTIVITY LIMITATIONS: carrying, lifting, bending, sleeping, stairs, and reach over head  PARTICIPATION LIMITATIONS: cleaning, shopping, community activity, and church  PERSONAL FACTORS: Time since onset of injury/illness/exacerbation and 1-2 comorbidities: Osteopenia, Lumbar Compression Fx at L4  are also affecting patient's functional outcome.   REHAB POTENTIAL: Good  CLINICAL DECISION MAKING: Stable/uncomplicated  EVALUATION COMPLEXITY: Low   GOALS: Goals reviewed with patient? Yes  SHORT TERM GOALS: Target date: 11/12/2022  Pt will be independent with initial HEP. Baseline: Goal status: INITIAL  2.  Pt will report at least a 30% improvement in pain/symptoms since initial evaluation. Baseline:  Goal status: INITIAL    LONG TERM GOALS: Target date: 12/17/2022  Pt will be independent with advanced HEP to allow for self-progression. Baseline:  Goal status: INITIAL  2.  Pt will increase FOTO to at least 67 to demonstrate improvements  in functional mobility. Baseline:  Goal status: INITIAL  3.  Pt will increase right hip strength to at least 4+/5 to allow her for perform community tasks with increased ease. Baseline:  Goal status: INITIAL  4.  Pt will report ability to walk her dog for greater than 15-20 minutes without any increased pain or loss of balance. Baseline:  Goal status: INITIAL  5.  Pt will increase single leg stance time at greater than 10 sec on either leg to demonstrate decreased risk of falling. Baseline:  Goal status: INITIAL    PLAN:  PT FREQUENCY: 2x/week  PT  DURATION: 8 weeks  PLANNED INTERVENTIONS: Therapeutic exercises, Therapeutic activity, Neuromuscular re-education, Balance training, Gait training, Patient/Family education, Self Care, Joint mobilization, Joint manipulation, Stair training, Aquatic Therapy, Dry Needling, Electrical stimulation, Spinal manipulation, Spinal mobilization, Cryotherapy, Moist heat, Taping, Traction, Ultrasound, Ionotophoresis 4mg /ml Dexamethasone, Manual therapy, and Re-evaluation.  PLAN FOR NEXT SESSION: Progress HEP as indicated, strengthening, flexibility, core stability   Solon Palm, PT  11/09/22 1:21 PM [facility] [address], [address] [place], [place] [address] Phone # [phone] Fax [phone]

## 2022-11-11 ENCOUNTER — Ambulatory Visit: Payer: Medicare Other | Admitting: Physical Therapy

## 2022-11-11 ENCOUNTER — Encounter: Payer: Self-pay | Admitting: Physical Therapy

## 2022-11-11 DIAGNOSIS — R262 Difficulty in walking, not elsewhere classified: Secondary | ICD-10-CM

## 2022-11-11 DIAGNOSIS — R252 Cramp and spasm: Secondary | ICD-10-CM | POA: Diagnosis not present

## 2022-11-11 DIAGNOSIS — M5459 Other low back pain: Secondary | ICD-10-CM | POA: Diagnosis not present

## 2022-11-11 DIAGNOSIS — R2689 Other abnormalities of gait and mobility: Secondary | ICD-10-CM | POA: Diagnosis not present

## 2022-11-11 DIAGNOSIS — M6281 Muscle weakness (generalized): Secondary | ICD-10-CM

## 2022-11-11 NOTE — Therapy (Signed)
OUTPATIENT PHYSICAL THERAPY THORACOLUMBAR TREATMENT   Patient Name: Barbara Maxwell MRN: [id number] DOB:27-Aug-1935, 87 y.o., female Today's Date: 11/11/2022  END OF SESSION:  PT End of Session - 11/11/22 1448     Visit Number 5    Date for PT Re-Evaluation 12/17/22    Authorization Type Medicare    PT Start Time 1448    PT Stop Time 1535    PT Time Calculation (min) 47 min    Activity Tolerance Patient tolerated treatment well    Behavior During Therapy WFL for tasks assessed/performed                Past Medical History:  Diagnosis Date   Anemia    B12 deficiency    Cancer (HCC)    [facility]-  MELANOMA REMOVED FROM BACK    Coronary artery disease    Depression    Hypercholesterolemia    Hypertension    Past Surgical History:  Procedure Laterality Date   ABDOMINAL HYSTERECTOMY     VAGINAL HYSTERECTOMY WITH OVARIAN PRESERVATION    ABDOMINOPLASTY  1998   APPENDECTOMY     BREAST SURGERY  1985   BREAST REDUCTION    CARDIAC CATHETERIZATION  11/19/2019   CORONARY BALLOON ANGIOPLASTY N/A 11/19/2019   Procedure: CORONARY BALLOON ANGIOPLASTY;  Surgeon: Runell Gess, MD;  Location: [facility];  Service: Cardiovascular;  Laterality: N/A;   LEFT HEART CATH AND CORONARY ANGIOGRAPHY N/A 11/19/2019   Procedure: LEFT HEART CATH AND CORONARY ANGIOGRAPHY;  Surgeon: Runell Gess, MD;  Location: [facility];  Service: Cardiovascular;  Laterality: N/A;   TONSILLECTOMY AND ADENOIDECTOMY     Patient Active Problem List   Diagnosis Date Noted   Lumbar compression fracture (HCC) 08/31/2022   Chronic low back pain without sciatica 11/07/2020   Antiplatelet or antithrombotic long-term use 06/13/2020   Iron deficiency anemia 06/02/2020   CAD (coronary artery disease) 11/19/2019   Atypical chest pain 09/28/2019   Dyspnea on exertion 09/28/2019   H/O vitamin D deficiency 01/21/2015   Osteopenia 01/21/2015   Vaginal atrophy 01/21/2015   Mixed  hyperlipidemia 02/04/2014   Family history of ovarian cancer 11/13/2012   Rectocele 11/07/2012   Hyperlipidemia 09/29/2012   Essential hypertension 08/07/2009   ALLERGIC RHINITIS 08/07/2009   HYPERSOMNIA WITH SLEEP APNEA UNSPECIFIED 08/04/2009    PCP: [name], FNP  REFERRING PROVIDER: [name], FNP  REFERRING DIAG: [phone] (ICD-10-CM) - Chronic low back pain without sciatica, unspecified back pain laterality  Rationale for Evaluation and Treatment: Rehabilitation  THERAPY DIAG:  Other low back pain  Cramp and spasm  Muscle weakness (generalized)  Difficulty in walking, not elsewhere classified  ONSET DATE: a month ago  SUBJECTIVE:  SUBJECTIVE STATEMENT: In water aerobics  check goals   PERTINENT HISTORY:  Spina Bifida, HTN, B12 Deficiency  PAIN:  Are you having pain? Yes: NPRS scale: 5/10 Pain location: right sided low back Pain description: burning, throbbing, aching Aggravating factors: too much activity Relieving factors: medication, rest  PRECAUTIONS: Fall  RED FLAGS: Compression fracture: Yes: L4     FALLS:  Has patient fallen in last 6 months? Yes. Number of falls 1 fall last night  LIVING ENVIRONMENT: Lives with: lives alone in Independent Living Lives in: House/apartment Stairs: No Has following equipment at home: Single point cane, Walker - 4 wheeled, shower chair, bed side commode, and Grab bars  OCCUPATION: Retired  PLOF: Independent and Leisure: walking dog, water aerobics 2x/week, going out to dinner with friends, going to church and church activities  PATIENT GOALS: To decrease pain and be able to do desired activities  NEXT MD VISIT: Dr [name] on Feb 14, 2023  OBJECTIVE:   DIAGNOSTIC FINDINGS:  Lumbar MRI on  07/25/2022: IMPRESSION: 1. Acute or subacute L4 compression fracture with 35% height loss. 2. Diffuse lumbar disc and facet degeneration without spinal stenosis. 3. Moderate neural foraminal stenosis at L3-4 and L5-S1.  PATIENT SURVEYS:  FOTO 57 (projected 46 by visit 10)   COGNITION: Overall cognitive status: Within functional limits for tasks assessed     SENSATION: Pt states that she sometimes has some numbness and tingling in her right left arm  MUSCLE LENGTH: Hamstrings: WFL  POSTURE: rounded shoulders  PALPATION: Tightness/muscle spasms noted on bilateral lumbar paraspinals, especially right  LUMBAR ROM:   Eval:  Limited with right lumbar rotation with increased pain.  LOWER EXTREMITY ROM:     WFL  LOWER EXTREMITY MMT:    Eval:  Right hip strength grossly 4/5 Left hip strength grossly 4+/5 Bilat quads WFL Bilat hamstrings 4+/5  LUMBAR SPECIAL TESTS:  Eval:  Slump test: Positive on right  FUNCTIONAL TESTS:  Eval: 5 times sit to stand: 11.2 sec Single leg Stance: right- 1.68 sec, left- 1.45 sec   GAIT: Distance walked: > 500 ft Assistive device utilized: None Level of assistance: Complete Independence Comments: Pt with antalgic gait, but states that she has had more pain since her fall last night  TODAY'S TREATMENT:                                                                                                                              DATE:  11/11/2022 Nustep x 6.5 min level 5 - discussing status Tandem stance B max hold; tandem stance with head turns B Star taps SLS B x 4 ea; also with no tap when able SLS at [facility] - multiple reps Standing hip flexion with green loop R x 10 starts to feel in R low back Supine isometric hip flexion with left leg press green loop x 6 feels in back Sidelying clam x 10 R, then with green loop x 10 Sidelying hip  ABD x 10 with green loop x 8 (difficult to stabilize trunk)_ Prone alt hip extension over one pillow  2x10 Prone alt shoulder flex x 5 ea over pillow TPR to R QL in Sidelying Discussed possible DN to R glutes next visit. Handout provided.  11/09/2022 Nustep x 5 min level 5 Standing row red tband x 10 Standing horizontal abd red tband x 10 Standing hip ABD and ext x 14 B Tandem stance B max hold; tandem walking Star taps SLS B x 4 ea; also with no tap when able SLS at [facility] - multiple reps Manual: IASTM and STM to B T-L paraspinals. TPR to L QL in sidelying.  11/05/2022 Nustep x 5 min level 5 Supine PPT x 20 PPT with 90/90 heel tap x 20 some pain in back at end [place] with PPT x 10 Seated piriformis stretch 3 x 30 sec each LE Standing hamstring stretch 2 x 30 sec each LE Standing quad/hip flexor stretch 2 x 30 sec each LE Self MFR to R lumbar and thoracic paraspinals Standing row red tband x 10 Standing horizontal abd red tband x 10 Manual: IASTM and STM to R T-L paraspinals and low traps  11/03/2022 Nustep x 5 min level 5 Standing hamstring stretch 2 x 30 sec each LE Standing quad/hip flexor stretch 2 x 30 sec each LE Supine PPT x 20 PPT with 90/90 heel tap x 20 PPT with trunk rotation x 20 Seated piriformis stretch 3 x 30 sec each LE   DATE: 10/22/2022  Reviewed HEP and discussed PT plan of care/goals   PATIENT EDUCATION:  Education details: HEP update Person educated: Patient Education method: Explanation, Demonstration, and Handouts Education comprehension: verbalized understanding  HOME EXERCISE PROGRAM: Access Code: [email or web address] URL: [email or web address] Date: 11/11/2022 Prepared by: Raynelle Fanning  Exercises - Standing Single Leg Stance with Counter Support  - 1 x daily - 7 x weekly - 2 reps - 20 sec hold - Standing Tandem Balance with Counter Support  - 1 x daily - 7 x weekly - 2 reps - 20 sec hold - Standing March with Counter Support  - 1 x daily - 7 x weekly - 2 sets - 10 reps - Standing Hip Abduction with Counter Support  - 1 x daily - 7 x weekly - 2  sets - 10 reps - Standing Hip Extension with Counter Support  - 1 x daily - 7 x weekly - 2 sets - 10 reps - Standing Bilateral Low Shoulder Row with Anchored Resistance  - 1 x daily - 3 x weekly - 2 sets - 10 reps - Standing Shoulder Horizontal Abduction with Resistance  - 1 x daily - 3 x weekly - 2 sets - 10 reps - Shoulder extension with resistance - Neutral  - 1 x daily - 3 x weekly - 2 sets - 10 reps - Prone Hip Extension with Pillow Under Abdomen  - 1 x daily - 3-4 x weekly - 2-3 sets - 10 reps  Patient Education - Trigger Point Dry Needling  ASSESSMENT:  CLINICAL IMPRESSION: Latoi reports 25% improvement overall with her back pain. She reports that she felt rib pain on the right today during water aerobics which was likely R QL pain. She was tight there and responded well to TPR. Her main complaint is R-sided pain at the PSIS. She demonstrates instability in her core with sidelying exercises today and would benefit from continued glute and paraspinal strengthening to help with this. We discussed TPDN to  the areas of pain next visit and handout was provided. Kimber continues to demonstrate potential for improvement and would benefit from continued skilled therapy to address impairments.    OBJECTIVE IMPAIRMENTS: decreased balance, decreased ROM, decreased strength, increased muscle spasms, postural dysfunction, and pain.   ACTIVITY LIMITATIONS: carrying, lifting, bending, sleeping, stairs, and reach over head  PARTICIPATION LIMITATIONS: cleaning, shopping, community activity, and church  PERSONAL FACTORS: Time since onset of injury/illness/exacerbation and 1-2 comorbidities: Osteopenia, Lumbar Compression Fx at L4  are also affecting patient's functional outcome.   REHAB POTENTIAL: Good  CLINICAL DECISION MAKING: Stable/uncomplicated  EVALUATION COMPLEXITY: Low   GOALS: Goals reviewed with patient? Yes  SHORT TERM GOALS: Target date: 11/12/2022  Pt will be independent with initial  HEP. Baseline: Goal status: PARTIALLY MET  2.  Pt will report at least a 30% improvement in pain/symptoms since initial evaluation. Baseline:  Goal status: IN PROGRESS - 25% improvement    LONG TERM GOALS: Target date: 12/17/2022  Pt will be independent with advanced HEP to allow for self-progression. Baseline:  Goal status: INITIAL  2.  Pt will increase FOTO to at least 67 to demonstrate improvements in functional mobility. Baseline:  Goal status: INITIAL  3.  Pt will increase right hip strength to at least 4+/5 to allow her for perform community tasks with increased ease. Baseline:  Goal status: INITIAL  4.  Pt will report ability to walk her dog for greater than 15-20 minutes without any increased pain or loss of balance. Baseline:  Goal status: INITIAL  5.  Pt will increase single leg stance time at greater than 10 sec on either leg to demonstrate decreased risk of falling. Baseline:  Goal status: INITIAL    PLAN:  PT FREQUENCY: 2x/week  PT DURATION: 8 weeks  PLANNED INTERVENTIONS: Therapeutic exercises, Therapeutic activity, Neuromuscular re-education, Balance training, Gait training, Patient/Family education, Self Care, Joint mobilization, Joint manipulation, Stair training, Aquatic Therapy, Dry Needling, Electrical stimulation, Spinal manipulation, Spinal mobilization, Cryotherapy, Moist heat, Taping, Traction, Ultrasound, Ionotophoresis 4mg /ml Dexamethasone, Manual therapy, and Re-evaluation.  PLAN FOR NEXT SESSION: Trial of DN to R hip and possibly QL, continue with prone lumbar stab and clams/ABD. Add SLR for core. Progress HEP as indicated, strengthening, flexibility, core stability   Solon Palm, PT  11/11/22 3:50 PM [facility] [address], [address] [place], [place] [address] Phone # [phone] Fax [phone]

## 2022-11-14 NOTE — Therapy (Signed)
OUTPATIENT PHYSICAL THERAPY THORACOLUMBAR TREATMENT   Patient Name: Barbara Maxwell MRN: [id number] DOB:1935/06/30, 87 y.o., female Today's Date: 11/15/2022  END OF SESSION:  PT End of Session - 11/15/22 1151     Visit Number 6    Date for PT Re-Evaluation 12/17/22    Authorization Type Medicare    Progress Note Due on Visit 10    PT Start Time 1149    PT Stop Time 1231    PT Time Calculation (min) 42 min    Activity Tolerance Patient tolerated treatment well    Behavior During Therapy WFL for tasks assessed/performed                 Past Medical History:  Diagnosis Date   Anemia    B12 deficiency    Cancer (HCC)    [facility]-  MELANOMA REMOVED FROM BACK    Coronary artery disease    Depression    Hypercholesterolemia    Hypertension    Past Surgical History:  Procedure Laterality Date   ABDOMINAL HYSTERECTOMY     VAGINAL HYSTERECTOMY WITH OVARIAN PRESERVATION    ABDOMINOPLASTY  1998   APPENDECTOMY     BREAST SURGERY  1985   BREAST REDUCTION    CARDIAC CATHETERIZATION  11/19/2019   CORONARY BALLOON ANGIOPLASTY N/A 11/19/2019   Procedure: CORONARY BALLOON ANGIOPLASTY;  Surgeon: Runell Gess, MD;  Location: [facility];  Service: Cardiovascular;  Laterality: N/A;   LEFT HEART CATH AND CORONARY ANGIOGRAPHY N/A 11/19/2019   Procedure: LEFT HEART CATH AND CORONARY ANGIOGRAPHY;  Surgeon: Runell Gess, MD;  Location: [facility];  Service: Cardiovascular;  Laterality: N/A;   TONSILLECTOMY AND ADENOIDECTOMY     Patient Active Problem List   Diagnosis Date Noted   Lumbar compression fracture (HCC) 08/31/2022   Chronic low back pain without sciatica 11/07/2020   Antiplatelet or antithrombotic long-term use 06/13/2020   Iron deficiency anemia 06/02/2020   CAD (coronary artery disease) 11/19/2019   Atypical chest pain 09/28/2019   Dyspnea on exertion 09/28/2019   H/O vitamin D deficiency 01/21/2015   Osteopenia 01/21/2015   Vaginal  atrophy 01/21/2015   Mixed hyperlipidemia 02/04/2014   Family history of ovarian cancer 11/13/2012   Rectocele 11/07/2012   Hyperlipidemia 09/29/2012   Essential hypertension 08/07/2009   ALLERGIC RHINITIS 08/07/2009   HYPERSOMNIA WITH SLEEP APNEA UNSPECIFIED 08/04/2009    PCP: [name], FNP  REFERRING PROVIDER: [name], FNP  REFERRING DIAG: [phone] (ICD-10-CM) - Chronic low back pain without sciatica, unspecified back pain laterality  Rationale for Evaluation and Treatment: Rehabilitation  THERAPY DIAG:  Other low back pain  Cramp and spasm  Muscle weakness (generalized)  Difficulty in walking, not elsewhere classified  Other abnormalities of gait and mobility  ONSET DATE: a month ago  SUBJECTIVE:  SUBJECTIVE STATEMENT: Sore in the morning but then it gets better as I get moving.  PERTINENT HISTORY:  Spina Bifida, HTN, B12 Deficiency  PAIN:  Are you having pain? Yes: NPRS scale: 5/10 Pain location: right sided low back Pain description: burning, throbbing, aching Aggravating factors: too much activity Relieving factors: medication, rest  PRECAUTIONS: Fall  RED FLAGS: Compression fracture: Yes: L4     FALLS:  Has patient fallen in last 6 months? Yes. Number of falls 1 fall last night  LIVING ENVIRONMENT: Lives with: lives alone in Independent Living Lives in: House/apartment Stairs: No Has following equipment at home: Single point cane, Walker - 4 wheeled, shower chair, bed side commode, and Grab bars  OCCUPATION: Retired  PLOF: Independent and Leisure: walking dog, water aerobics 2x/week, going out to dinner with friends, going to church and church activities  PATIENT GOALS: To decrease pain and be able to do desired activities  NEXT  MD VISIT: Dr [name] on Feb 14, 2023  OBJECTIVE:   DIAGNOSTIC FINDINGS:  Lumbar MRI on 07/25/2022: IMPRESSION: 1. Acute or subacute L4 compression fracture with 35% height loss. 2. Diffuse lumbar disc and facet degeneration without spinal stenosis. 3. Moderate neural foraminal stenosis at L3-4 and L5-S1.  PATIENT SURVEYS:  FOTO 57 (projected 44 by visit 10)   COGNITION: Overall cognitive status: Within functional limits for tasks assessed     SENSATION: Pt states that she sometimes has some numbness and tingling in her right left arm  MUSCLE LENGTH: Hamstrings: WFL  POSTURE: rounded shoulders  PALPATION: Tightness/muscle spasms noted on bilateral lumbar paraspinals, especially right  LUMBAR ROM:   Eval:  Limited with right lumbar rotation with increased pain.  LOWER EXTREMITY ROM:     WFL  LOWER EXTREMITY MMT:    Eval:  Right hip strength grossly 4/5 Left hip strength grossly 4+/5 Bilat quads WFL Bilat hamstrings 4+/5  LUMBAR SPECIAL TESTS:  Eval:  Slump test: Positive on right  FUNCTIONAL TESTS:  Eval: 5 times sit to stand: 11.2 sec Single leg Stance: right- 1.68 sec, left- 1.45 sec   GAIT: Distance walked: > 500 ft Assistive device utilized: None Level of assistance: Complete Independence Comments: Pt with antalgic gait, but states that she has had more pain since her fall last night  TODAY'S TREATMENT:                                                                                                                              DATE:  11/15/2022 Nustep x 6.5 min level 5 - discussing status 1290 ft   Heel raises x 20  Seated fig 4 2x30 sec Tandem stance B max hold cues for LE muscle contraction SLS at [facility] - multiple reps, worked on slowing her down with 3 count march and hold which helped her SLS time Walking over agility bars for balance; worked on slowing her down which challenges her balance more; also did with 5# wt  in hands and then  unilaterally held 5# wt. Prone alt hip extension over one pillow   11/11/2022 Nustep x 6.5 min level 5 - discussing status Tandem stance B max hold; tandem stance with head turns B Star taps SLS B x 4 ea; also with no tap when able SLS at [facility] - multiple reps Standing hip flexion with green loop R x 10 starts to feel in R low back Supine isometric hip flexion with left leg press green loop x 6 feels in back Sidelying clam x 10 R, then with green loop x 10 Sidelying hip ABD x 10 with green loop x 8 (difficult to stabilize trunk)_ Prone alt hip extension over one pillow 2x10 Prone alt shoulder flex x 5 ea over pillow TPR to R QL in Sidelying Discussed possible DN to R glutes next visit. Handout provided.  11/09/2022 Nustep x 5 min level 5 Standing row red tband x 10 Standing horizontal abd red tband x 10 Standing hip ABD and ext x 14 B Tandem stance B max hold; tandem walking Star taps SLS B x 4 ea; also with no tap when able SLS at [facility] - multiple reps Manual: IASTM and STM to B T-L paraspinals. TPR to L QL in sidelying.  11/05/2022 Nustep x 5 min level 5 Supine PPT x 20 PPT with 90/90 heel tap x 20 some pain in back at end [place] with PPT x 10 Seated piriformis stretch 3 x 30 sec each LE Standing hamstring stretch 2 x 30 sec each LE Standing quad/hip flexor stretch 2 x 30 sec each LE Self MFR to R lumbar and thoracic paraspinals Standing row red tband x 10 Standing horizontal abd red tband x 10 Manual: IASTM and STM to R T-L paraspinals and low traps    PATIENT EDUCATION:  Education details: HEP update Person educated: Patient Education method: Explanation, Demonstration, and Handouts Education comprehension: verbalized understanding  HOME EXERCISE PROGRAM: Access Code: [email or web address] URL: [email or web address] Date: 11/15/2022 Prepared by: Raynelle Fanning  Exercises - Standing Single Leg Stance with Counter Support  - 1 x daily - 7 x weekly - 2 reps - 20 sec  hold - Standing Tandem Balance with Counter Support  - 1 x daily - 7 x weekly - 2 reps - 20 sec hold - Standing March with Counter Support  - 1 x daily - 7 x weekly - 2 sets - 10 reps - Standing Hip Abduction with Counter Support  - 1 x daily - 7 x weekly - 2 sets - 10 reps - Standing Hip Extension with Counter Support  - 1 x daily - 7 x weekly - 2 sets - 10 reps - Standing Bilateral Low Shoulder Row with Anchored Resistance  - 1 x daily - 3 x weekly - 2 sets - 10 reps - Standing Shoulder Horizontal Abduction with Resistance  - 1 x daily - 3 x weekly - 2 sets - 10 reps - Shoulder extension with resistance - Neutral  - 1 x daily - 3 x weekly - 2 sets - 10 reps - Prone Hip Extension with Pillow Under Abdomen  - 1 x daily - 3-4 x weekly - 2-3 sets - 10 reps - Seated Piriformis Stretch with Trunk Bend  - 2 x daily - 7 x weekly - 1 sets - 3 reps - 30-60 sec  hold  Patient Education - Trigger Point Dry Needling   ASSESSMENT:  CLINICAL IMPRESSION: Davena reports she was sore after last visit, but no  change in pain. She hurts in the morning ,but pain decreases as she gets moving. Additionally her pain is more in the right hip than in the R lumbar near fracture site. She hasn't been walking her dog, so we did the to assess her pain with walking. She competed 1290 ft with no increase in pain and no LOB. PT encouraged her to start walking more even if the dog doesn't want to. She wanted to defer DN to the R hip today, but may want it next visit. She did respond well to seated fig 4 stretch which she feels in the area of her pain.   OBJECTIVE IMPAIRMENTS: decreased balance, decreased ROM, decreased strength, increased muscle spasms, postural dysfunction, and pain.   ACTIVITY LIMITATIONS: carrying, lifting, bending, sleeping, stairs, and reach over head  PARTICIPATION LIMITATIONS: cleaning, shopping, community activity, and church  PERSONAL FACTORS: Time since onset of injury/illness/exacerbation and  1-2 comorbidities: Osteopenia, Lumbar Compression Fx at L4  are also affecting patient's functional outcome.   REHAB POTENTIAL: Good  CLINICAL DECISION MAKING: Stable/uncomplicated  EVALUATION COMPLEXITY: Low   GOALS: Goals reviewed with patient? Yes  SHORT TERM GOALS: Target date: 11/12/2022  Pt will be independent with initial HEP. Baseline: Goal status: PARTIALLY MET  2.  Pt will report at least a 30% improvement in pain/symptoms since initial evaluation. Baseline:  Goal status: IN PROGRESS - 25% improvement    LONG TERM GOALS: Target date: 12/17/2022  Pt will be independent with advanced HEP to allow for self-progression. Baseline:  Goal status: INITIAL  2.  Pt will increase FOTO to at least 67 to demonstrate improvements in functional mobility. Baseline:  Goal status: INITIAL  3.  Pt will increase right hip strength to at least 4+/5 to allow her for perform community tasks with increased ease. Baseline:  Goal status: INITIAL  4.  Pt will report ability to walk her dog for greater than 15-20 minutes without any increased pain or loss of balance. Baseline:  Goal status: INITIAL  5.  Pt will increase single leg stance time at greater than 10 sec on either leg to demonstrate decreased risk of falling. Baseline:  Goal status: INITIAL    PLAN:  PT FREQUENCY: 2x/week  PT DURATION: 8 weeks  PLANNED INTERVENTIONS: Therapeutic exercises, Therapeutic activity, Neuromuscular re-education, Balance training, Gait training, Patient/Family education, Self Care, Joint mobilization, Joint manipulation, Stair training, Aquatic Therapy, Dry Needling, Electrical stimulation, Spinal manipulation, Spinal mobilization, Cryotherapy, Moist heat, Taping, Traction, Ultrasound, Ionotophoresis 4mg /ml Dexamethasone, Manual therapy, and Re-evaluation.  PLAN FOR NEXT SESSION: Trial of DN to R hip and possibly QL, continue with prone lumbar stab and clams/ABD. Progress HEP as indicated,  strengthening, flexibility, core stability   Solon Palm, PT  11/15/22 1:28 PM [facility] [address] [place], [place] [address] Phone # [phone] Fax [phone]

## 2022-11-15 ENCOUNTER — Ambulatory Visit: Payer: Medicare Other | Admitting: Physical Therapy

## 2022-11-15 ENCOUNTER — Encounter: Payer: Self-pay | Admitting: Physical Therapy

## 2022-11-15 DIAGNOSIS — R252 Cramp and spasm: Secondary | ICD-10-CM

## 2022-11-15 DIAGNOSIS — R2689 Other abnormalities of gait and mobility: Secondary | ICD-10-CM

## 2022-11-15 DIAGNOSIS — R262 Difficulty in walking, not elsewhere classified: Secondary | ICD-10-CM

## 2022-11-15 DIAGNOSIS — M5459 Other low back pain: Secondary | ICD-10-CM

## 2022-11-15 DIAGNOSIS — M6281 Muscle weakness (generalized): Secondary | ICD-10-CM | POA: Diagnosis not present

## 2022-11-16 DIAGNOSIS — Z23 Encounter for immunization: Secondary | ICD-10-CM | POA: Diagnosis not present

## 2022-11-18 ENCOUNTER — Encounter: Payer: Self-pay | Admitting: Rehabilitative and Restorative Service Providers"

## 2022-11-18 ENCOUNTER — Ambulatory Visit: Payer: Medicare Other | Admitting: Rehabilitative and Restorative Service Providers"

## 2022-11-18 DIAGNOSIS — R252 Cramp and spasm: Secondary | ICD-10-CM

## 2022-11-18 DIAGNOSIS — M5459 Other low back pain: Secondary | ICD-10-CM | POA: Diagnosis not present

## 2022-11-18 DIAGNOSIS — R262 Difficulty in walking, not elsewhere classified: Secondary | ICD-10-CM | POA: Diagnosis not present

## 2022-11-18 DIAGNOSIS — M6281 Muscle weakness (generalized): Secondary | ICD-10-CM | POA: Diagnosis not present

## 2022-11-18 DIAGNOSIS — R2689 Other abnormalities of gait and mobility: Secondary | ICD-10-CM

## 2022-11-18 NOTE — Therapy (Signed)
OUTPATIENT PHYSICAL THERAPY TREATMENT NOTE   Patient Name: Barbara Maxwell MRN: [id number] DOB:March 13, 1935, 87 y.o., female Today's Date: 11/18/2022  END OF SESSION:  PT End of Session - 11/18/22 1103     Visit Number 7    Date for PT Re-Evaluation 12/17/22    Authorization Type Medicare    Progress Note Due on Visit 10    PT Start Time 1100    PT Stop Time 1140    PT Time Calculation (min) 40 min    Activity Tolerance Patient tolerated treatment well    Behavior During Therapy WFL for tasks assessed/performed                 Past Medical History:  Diagnosis Date   Anemia    B12 deficiency    Cancer (HCC)    [facility]-  MELANOMA REMOVED FROM BACK    Coronary artery disease    Depression    Hypercholesterolemia    Hypertension    Past Surgical History:  Procedure Laterality Date   ABDOMINAL HYSTERECTOMY     VAGINAL HYSTERECTOMY WITH OVARIAN PRESERVATION    ABDOMINOPLASTY  1998   APPENDECTOMY     BREAST SURGERY  1985   BREAST REDUCTION    CARDIAC CATHETERIZATION  11/19/2019   CORONARY BALLOON ANGIOPLASTY N/A 11/19/2019   Procedure: CORONARY BALLOON ANGIOPLASTY;  Surgeon: Runell Gess, MD;  Location: [facility];  Service: Cardiovascular;  Laterality: N/A;   LEFT HEART CATH AND CORONARY ANGIOGRAPHY N/A 11/19/2019   Procedure: LEFT HEART CATH AND CORONARY ANGIOGRAPHY;  Surgeon: Runell Gess, MD;  Location: [facility];  Service: Cardiovascular;  Laterality: N/A;   TONSILLECTOMY AND ADENOIDECTOMY     Patient Active Problem List   Diagnosis Date Noted   Lumbar compression fracture (HCC) 08/31/2022   Chronic low back pain without sciatica 11/07/2020   Antiplatelet or antithrombotic long-term use 06/13/2020   Iron deficiency anemia 06/02/2020   CAD (coronary artery disease) 11/19/2019   Atypical chest pain 09/28/2019   Dyspnea on exertion 09/28/2019   H/O vitamin D deficiency 01/21/2015   Osteopenia 01/21/2015   Vaginal atrophy  01/21/2015   Mixed hyperlipidemia 02/04/2014   Family history of ovarian cancer 11/13/2012   Rectocele 11/07/2012   Hyperlipidemia 09/29/2012   Essential hypertension 08/07/2009   ALLERGIC RHINITIS 08/07/2009   HYPERSOMNIA WITH SLEEP APNEA UNSPECIFIED 08/04/2009    PCP: [name], FNP  REFERRING PROVIDER: [name], FNP  REFERRING DIAG: [phone] (ICD-10-CM) - Chronic low back pain without sciatica, unspecified back pain laterality  Rationale for Evaluation and Treatment: Rehabilitation  THERAPY DIAG:  Other low back pain  Cramp and spasm  Muscle weakness (generalized)  Other abnormalities of gait and mobility  ONSET DATE: a month ago  SUBJECTIVE:  SUBJECTIVE STATEMENT: Pt reports that she is feeling better with her back, states that she got her covid and flu shot yesterday.  Pt interested in dry needling today.  PERTINENT HISTORY:  Name pronounced [name], HTN, B12 Deficiency  PAIN:  Are you having pain? Yes: NPRS scale: 4/10 Pain location: right sided low back Pain description: burning, throbbing, aching Aggravating factors: too much activity Relieving factors: medication, rest  PRECAUTIONS: Fall  RED FLAGS: Compression fracture: Yes: L4     FALLS:  Has patient fallen in last 6 months? Yes. Number of falls 1 fall last night  LIVING ENVIRONMENT: Lives with: lives alone in Independent Living Lives in: House/apartment Stairs: No Has following equipment at home: Single point cane, Walker - 4 wheeled, shower chair, bed side commode, and Grab bars  OCCUPATION: Retired  PLOF: Independent and Leisure: walking dog, water aerobics 2x/week, going out to dinner with friends, going to church and church activities  PATIENT GOALS: To decrease  pain and be able to do desired activities  NEXT MD VISIT: Dr [name] on Feb 14, 2023  OBJECTIVE:   DIAGNOSTIC FINDINGS:  Lumbar MRI on 07/25/2022: IMPRESSION: 1. Acute or subacute L4 compression fracture with 35% height loss. 2. Diffuse lumbar disc and facet degeneration without spinal stenosis. 3. Moderate neural foraminal stenosis at L3-4 and L5-S1.  PATIENT SURVEYS:  Eval:  FOTO 57 (projected 34 by visit 10)   COGNITION: Overall cognitive status: Within functional limits for tasks assessed     SENSATION: Pt states that she sometimes has some numbness and tingling in her right left arm  MUSCLE LENGTH: Hamstrings: WFL  POSTURE: rounded shoulders  PALPATION: Tightness/muscle spasms noted on bilateral lumbar paraspinals, especially right  LUMBAR ROM:   Eval:  Limited with right lumbar rotation with increased pain.  LOWER EXTREMITY ROM:     WFL  LOWER EXTREMITY MMT:    Eval:  Right hip strength grossly 4/5 Left hip strength grossly 4+/5 Bilat quads WFL Bilat hamstrings 4+/5  LUMBAR SPECIAL TESTS:  Eval:  Slump test: Positive on right  FUNCTIONAL TESTS:  Eval: 5 times sit to stand: 11.2 sec Single leg Stance: right- 1.68 sec, left- 1.45 sec   GAIT: Distance walked: > 500 ft Assistive device utilized: None Level of assistance: Complete Independence Comments: Pt with antalgic gait, but states that she has had more pain since her fall last night  TODAY'S TREATMENT:                                                                                                                              DATE:  11/18/2022 Nustep level 5 x6 min with PT present to discuss status Seated hamstring stretch 2x20 sec bilat Seated piriformis stretch 2x20 sec bilat Standing at counter:  heel raises, hip abduction, hip extension. 2x10 each bilat Seated 3 way green pball rollout 3x10 sec each direction Trigger Point Dry-Needling  Treatment instructions: Expect mild to moderate muscle  soreness. S/S of pneumothorax if dry needled over a lung field, and to seek immediate medical attention should they occur. Patient verbalized understanding of these instructions and education. Patient Consent Given: Yes Education handout provided: Yes Muscles treated: right side lumbar multifidi Electrical stimulation performed: No Parameters: N/A Treatment response/outcome: Utilized skilled palpation to identify bony landmarks and trigger points.  Able to illicit twitch response and muscle elongation.  Soft tissue mobilization following to further promote tissue elongation to bilateral lumbar paraspinals.    11/15/2022 Nustep x 6.5 min level 5 - discussing status 1290 ft   Heel raises x 20  Seated fig 4 2x30 sec Tandem stance B max hold cues for LE muscle contraction SLS at [facility] - multiple reps, worked on slowing her down with 3 count march and hold which helped her SLS time Walking over agility bars for balance; worked on slowing her down which challenges her balance more; also did with 5# wt in hands and then unilaterally held 5# wt. Prone alt hip extension over one pillow   11/11/2022 Nustep x 6.5 min level 5 - discussing status Tandem stance B max hold; tandem stance with head turns B Star taps SLS B x 4 ea; also with no tap when able SLS at [facility] - multiple reps Standing hip flexion with green loop R x 10 starts to feel in R low back Supine isometric hip flexion with left leg press green loop x 6 feels in back Sidelying clam x 10 R, then with green loop x 10 Sidelying hip ABD x 10 with green loop x 8 (difficult to stabilize trunk)_ Prone alt hip extension over one pillow 2x10 Prone alt shoulder flex x 5 ea over pillow TPR to R QL in Sidelying Discussed possible DN to R glutes next visit. Handout provided.    PATIENT EDUCATION:  Education details: HEP update Person educated: Patient Education method: Explanation, Demonstration, and Handouts Education comprehension:  verbalized understanding  HOME EXERCISE PROGRAM: Access Code: [email or web address] URL: [email or web address] Date: 11/15/2022 Prepared by: Raynelle Fanning  Exercises - Standing Single Leg Stance with Counter Support  - 1 x daily - 7 x weekly - 2 reps - 20 sec hold - Standing Tandem Balance with Counter Support  - 1 x daily - 7 x weekly - 2 reps - 20 sec hold - Standing March with Counter Support  - 1 x daily - 7 x weekly - 2 sets - 10 reps - Standing Hip Abduction with Counter Support  - 1 x daily - 7 x weekly - 2 sets - 10 reps - Standing Hip Extension with Counter Support  - 1 x daily - 7 x weekly - 2 sets - 10 reps - Standing Bilateral Low Shoulder Row with Anchored Resistance  - 1 x daily - 3 x weekly - 2 sets - 10 reps - Standing Shoulder Horizontal Abduction with Resistance  - 1 x daily - 3 x weekly - 2 sets - 10 reps - Shoulder extension with resistance - Neutral  - 1 x daily - 3 x weekly - 2 sets - 10 reps - Prone Hip Extension with Pillow Under Abdomen  - 1 x daily - 3-4 x weekly - 2-3 sets - 10 reps - Seated Piriformis Stretch with Trunk Bend  - 2 x daily - 7 x weekly - 1 sets - 3 reps - 30-60 sec  hold  Patient Education - Trigger Point Dry Needling   ASSESSMENT:  CLINICAL IMPRESSION: Ms Ghuman presents to skilled PT  today with reports of overall decreased pain.  Patient agreeable to trying dry needling today.  She had a good twitch response noted.  Followed with manual therapy for soft tissue mobilization to further promote tissue elongation and decreased pain.  Patient with reports of decreased pain following.    OBJECTIVE IMPAIRMENTS: decreased balance, decreased ROM, decreased strength, increased muscle spasms, postural dysfunction, and pain.   ACTIVITY LIMITATIONS: carrying, lifting, bending, sleeping, stairs, and reach over head  PARTICIPATION LIMITATIONS: cleaning, shopping, community activity, and church  PERSONAL FACTORS: Time since onset of injury/illness/exacerbation  and 1-2 comorbidities: Osteopenia, Lumbar Compression Fx at L4  are also affecting patient's functional outcome.   REHAB POTENTIAL: Good  CLINICAL DECISION MAKING: Stable/uncomplicated  EVALUATION COMPLEXITY: Low   GOALS: Goals reviewed with patient? Yes  SHORT TERM GOALS: Target date: 11/12/2022  Pt will be independent with initial HEP. Baseline: Goal status: MET  2.  Pt will report at least a 30% improvement in pain/symptoms since initial evaluation. Baseline:  Goal status: IN PROGRESS - 25% improvement    LONG TERM GOALS: Target date: 12/17/2022  Pt will be independent with advanced HEP to allow for self-progression. Baseline:  Goal status: INITIAL  2.  Pt will increase FOTO to at least 67 to demonstrate improvements in functional mobility. Baseline:  Goal status: INITIAL  3.  Pt will increase right hip strength to at least 4+/5 to allow her for perform community tasks with increased ease. Baseline:  Goal status: INITIAL  4.  Pt will report ability to walk her dog for greater than 15-20 minutes without any increased pain or loss of balance. Baseline:  Goal status: INITIAL  5.  Pt will increase single leg stance time at greater than 10 sec on either leg to demonstrate decreased risk of falling. Baseline:  Goal status: INITIAL    PLAN:  PT FREQUENCY: 2x/week  PT DURATION: 8 weeks  PLANNED INTERVENTIONS: Therapeutic exercises, Therapeutic activity, Neuromuscular re-education, Balance training, Gait training, Patient/Family education, Self Care, Joint mobilization, Joint manipulation, Stair training, Aquatic Therapy, Dry Needling, Electrical stimulation, Spinal manipulation, Spinal mobilization, Cryotherapy, Moist heat, Taping, Traction, Ultrasound, Ionotophoresis 4mg /ml Dexamethasone, Manual therapy, and Re-evaluation.  PLAN FOR NEXT SESSION: Assess response to dry needling, continue with prone lumbar stab and clams/ABD. Progress HEP as indicated, strengthening,  flexibility, core stability   Reather Laurence, PT, DPT 11/18/22, 1:40 PM  [facility] [address] [place], [place] [address] Phone # [phone] Fax [phone]

## 2022-11-19 NOTE — Therapy (Signed)
OUTPATIENT PHYSICAL THERAPY TREATMENT NOTE   Patient Name: Barbara Maxwell MRN: [id number] DOB:January 13, 1936, 87 y.o., female Today's Date: 11/22/2022  END OF SESSION:  PT End of Session - 11/22/22 1700     Visit Number 8    Date for PT Re-Evaluation 12/17/22    Authorization Type Medicare    Progress Note Due on Visit 10    PT Start Time 0417    PT Stop Time 0459    PT Time Calculation (min) 42 min                  Past Medical History:  Diagnosis Date   Anemia    B12 deficiency    Cancer (HCC)    [facility]-  MELANOMA REMOVED FROM BACK    Coronary artery disease    Depression    Hypercholesterolemia    Hypertension    Past Surgical History:  Procedure Laterality Date   ABDOMINAL HYSTERECTOMY     VAGINAL HYSTERECTOMY WITH OVARIAN PRESERVATION    ABDOMINOPLASTY  1998   APPENDECTOMY     BREAST SURGERY  1985   BREAST REDUCTION    CARDIAC CATHETERIZATION  11/19/2019   CORONARY BALLOON ANGIOPLASTY N/A 11/19/2019   Procedure: CORONARY BALLOON ANGIOPLASTY;  Surgeon: Runell Gess, MD;  Location: [facility];  Service: Cardiovascular;  Laterality: N/A;   LEFT HEART CATH AND CORONARY ANGIOGRAPHY N/A 11/19/2019   Procedure: LEFT HEART CATH AND CORONARY ANGIOGRAPHY;  Surgeon: Runell Gess, MD;  Location: [facility];  Service: Cardiovascular;  Laterality: N/A;   TONSILLECTOMY AND ADENOIDECTOMY     Patient Active Problem List   Diagnosis Date Noted   Lumbar compression fracture (HCC) 08/31/2022   Chronic low back pain without sciatica 11/07/2020   Antiplatelet or antithrombotic long-term use 06/13/2020   Iron deficiency anemia 06/02/2020   CAD (coronary artery disease) 11/19/2019   Atypical chest pain 09/28/2019   Dyspnea on exertion 09/28/2019   H/O vitamin D deficiency 01/21/2015   Osteopenia 01/21/2015   Vaginal atrophy 01/21/2015   Mixed hyperlipidemia 02/04/2014   Family history of ovarian cancer 11/13/2012   Rectocele 11/07/2012    Hyperlipidemia 09/29/2012   Essential hypertension 08/07/2009   ALLERGIC RHINITIS 08/07/2009   HYPERSOMNIA WITH SLEEP APNEA UNSPECIFIED 08/04/2009    PCP: [name], FNP  REFERRING PROVIDER: [name], FNP  REFERRING DIAG: [phone] (ICD-10-CM) - Chronic low back pain without sciatica, unspecified back pain laterality  Rationale for Evaluation and Treatment: Rehabilitation  THERAPY DIAG:  Other low back pain  Cramp and spasm  Muscle weakness (generalized)  Other abnormalities of gait and mobility  Difficulty in walking, not elsewhere classified  ONSET DATE: a month ago  SUBJECTIVE:  SUBJECTIVE STATEMENT: No pain today. I still feel tired across my upper back. Lower pain has been better.   PERTINENT HISTORY:  Name pronounced [name], HTN, B12 Deficiency  PAIN:  Are you having pain? Yes: NPRS scale: 0/10 Pain location: right sided low back Pain description: burning, throbbing, aching Aggravating factors: too much activity Relieving factors: medication, rest  PRECAUTIONS: Fall  RED FLAGS: Compression fracture: Yes: L4     FALLS:  Has patient fallen in last 6 months? Yes. Number of falls 1 fall last night  LIVING ENVIRONMENT: Lives with: lives alone in Independent Living Lives in: House/apartment Stairs: No Has following equipment at home: Single point cane, Walker - 4 wheeled, shower chair, bed side commode, and Grab bars  OCCUPATION: Retired  PLOF: Independent and Leisure: walking dog, water aerobics 2x/week, going out to dinner with friends, going to church and church activities  PATIENT GOALS: To decrease pain and be able to do desired activities  NEXT MD VISIT: Dr [name] on Feb 14, 2023  OBJECTIVE:   DIAGNOSTIC FINDINGS:   Lumbar MRI on 07/25/2022: IMPRESSION: 1. Acute or subacute L4 compression fracture with 35% height loss. 2. Diffuse lumbar disc and facet degeneration without spinal stenosis. 3. Moderate neural foraminal stenosis at L3-4 and L5-S1.  PATIENT SURVEYS:  Eval:  FOTO 57 (projected 69 by visit 10)   COGNITION: Overall cognitive status: Within functional limits for tasks assessed     SENSATION: Pt states that she sometimes has some numbness and tingling in her right left arm  MUSCLE LENGTH: Hamstrings: WFL  POSTURE: rounded shoulders  PALPATION: Tightness/muscle spasms noted on bilateral lumbar paraspinals, especially right  LUMBAR ROM:   Eval:  Limited with right lumbar rotation with increased pain.  LOWER EXTREMITY ROM:     WFL  LOWER EXTREMITY MMT:    Eval:  Right hip strength grossly 4/5 Left hip strength grossly 4+/5 Bilat quads WFL Bilat hamstrings 4+/5  LUMBAR SPECIAL TESTS:  Eval:  Slump test: Positive on right  FUNCTIONAL TESTS:  Eval: 5 times sit to stand: 11.2 sec Single leg Stance: right- 1.68 sec, left- 1.45 sec   GAIT: Distance walked: > 500 ft Assistive device utilized: None Level of assistance: Complete Independence Comments: Pt with antalgic gait, but states that she has had more pain since her fall last night  TODAY'S TREATMENT:                                                                                                                              DATE:  11/22/2022 Nustep x 6 min level 5 - discussing status Prone hip ext over one pillow 2 x 10  Prone M retraction 2x 10, Prone T 2x10 Sidelying clam x 20 B with green loop Sidelying hip ABD x 15 Trigger Point Dry-Needling  Treatment instructions: Expect mild to moderate muscle soreness. S/S of pneumothorax if dry needled over a lung field, and to seek immediate medical attention  should they occur. Patient verbalized understanding of these instructions and education. Patient Consent Given:  Yes Education handout provided: Previously provided Muscles treated: R thoracic paraspinals Electrical stimulation performed: No Parameters: N/A Treatment response/outcome: Twitch Response Elicited and Palpable Increase in Muscle Length  Skilled palpation and monitoring of soft tissues during DN; STM to R thoracic paraspinals.  11/18/2022 Nustep level 5 x6 min with PT present to discuss status Seated hamstring stretch 2x20 sec bilat Seated piriformis stretch 2x20 sec bilat Standing at counter:  heel raises, hip abduction, hip extension. 2x10 each bilat Seated 3 way green pball rollout 3x10 sec each direction Trigger Point Dry-Needling  Treatment instructions: Expect mild to moderate muscle soreness. S/S of pneumothorax if dry needled over a lung field, and to seek immediate medical attention should they occur. Patient verbalized understanding of these instructions and education. Patient Consent Given: Yes Education handout provided: Yes Muscles treated: right side lumbar multifidi Electrical stimulation performed: No Parameters: N/A Treatment response/outcome: Utilized skilled palpation to identify bony landmarks and trigger points.  Able to illicit twitch response and muscle elongation.  Soft tissue mobilization following to further promote tissue elongation to bilateral lumbar paraspinals.    11/15/2022 Nustep x 6.5 min level 5 - discussing status 1290 ft   Heel raises x 20  Seated fig 4 2x30 sec Tandem stance B max hold cues for LE muscle contraction SLS at [facility] - multiple reps, worked on slowing her down with 3 count march and hold which helped her SLS time Walking over agility bars for balance; worked on slowing her down which challenges her balance more; also did with 5# wt in hands and then unilaterally held 5# wt. Prone alt hip extension over one pillow   11/11/2022 Nustep x 6.5 min level 5 - discussing status Tandem stance B max hold; tandem stance with head turns  B Star taps SLS B x 4 ea; also with no tap when able SLS at [facility] - multiple reps Standing hip flexion with green loop R x 10 starts to feel in R low back Supine isometric hip flexion with left leg press green loop x 6 feels in back Sidelying clam x 10 R, then with green loop x 10 Sidelying hip ABD x 10 with green loop x 8 (difficult to stabilize trunk)_ Prone alt hip extension over one pillow 2x10 Prone alt shoulder flex x 5 ea over pillow TPR to R QL in Sidelying Discussed possible DN to R glutes next visit. Handout provided.    PATIENT EDUCATION:  Education details: HEP update Person educated: Patient Education method: Explanation, Demonstration, and Handouts Education comprehension: verbalized understanding  HOME EXERCISE PROGRAM: Access Code: [email or web address] URL: [email or web address] Date: 11/15/2022 Prepared by: Raynelle Fanning  Exercises - Standing Single Leg Stance with Counter Support  - 1 x daily - 7 x weekly - 2 reps - 20 sec hold - Standing Tandem Balance with Counter Support  - 1 x daily - 7 x weekly - 2 reps - 20 sec hold - Standing March with Counter Support  - 1 x daily - 7 x weekly - 2 sets - 10 reps - Standing Hip Abduction with Counter Support  - 1 x daily - 7 x weekly - 2 sets - 10 reps - Standing Hip Extension with Counter Support  - 1 x daily - 7 x weekly - 2 sets - 10 reps - Standing Bilateral Low Shoulder Row with Anchored Resistance  - 1 x daily - 3 x weekly - 2 sets -  10 reps - Standing Shoulder Horizontal Abduction with Resistance  - 1 x daily - 3 x weekly - 2 sets - 10 reps - Shoulder extension with resistance - Neutral  - 1 x daily - 3 x weekly - 2 sets - 10 reps - Prone Hip Extension with Pillow Under Abdomen  - 1 x daily - 3-4 x weekly - 2-3 sets - 10 reps - Seated Piriformis Stretch with Trunk Bend  - 2 x daily - 7 x weekly - 1 sets - 3 reps - 30-60 sec  hold  Patient Education - Trigger Point Dry Needling   ASSESSMENT:  CLINICAL  IMPRESSION: Wendee reports no pain in low back today and just tiredness in her mid back. Mid back was a little irritated with prone T and M. She has tightness in her R thoracic paraspinals. Trial of DN here with good twitch response. She needs some verbal cues with S/L hip ABD, but otherwise good form with TE. She practices her walking for balance in her hallway at home. She continues to demonstrate potential for improvement and would benefit from continued skilled therapy to address impairments.     OBJECTIVE IMPAIRMENTS: decreased balance, decreased ROM, decreased strength, increased muscle spasms, postural dysfunction, and pain.   ACTIVITY LIMITATIONS: carrying, lifting, bending, sleeping, stairs, and reach over head  PARTICIPATION LIMITATIONS: cleaning, shopping, community activity, and church  PERSONAL FACTORS: Time since onset of injury/illness/exacerbation and 1-2 comorbidities: Osteopenia, Lumbar Compression Fx at L4  are also affecting patient's functional outcome.   REHAB POTENTIAL: Good  CLINICAL DECISION MAKING: Stable/uncomplicated  EVALUATION COMPLEXITY: Low   GOALS: Goals reviewed with patient? Yes  SHORT TERM GOALS: Target date: 11/12/2022  Pt will be independent with initial HEP. Baseline: Goal status: MET  2.  Pt will report at least a 30% improvement in pain/symptoms since initial evaluation. Baseline:  Goal status: IN PROGRESS - 25% improvement    LONG TERM GOALS: Target date: 12/17/2022  Pt will be independent with advanced HEP to allow for self-progression. Baseline:  Goal status: INITIAL  2.  Pt will increase FOTO to at least 67 to demonstrate improvements in functional mobility. Baseline:  Goal status: INITIAL  3.  Pt will increase right hip strength to at least 4+/5 to allow her for perform community tasks with increased ease. Baseline:  Goal status: INITIAL  4.  Pt will report ability to walk her dog for greater than 15-20 minutes without any  increased pain or loss of balance. Baseline:  Goal status: INITIAL  5.  Pt will increase single leg stance time at greater than 10 sec on either leg to demonstrate decreased risk of falling. Baseline:  Goal status: INITIAL    PLAN:  PT FREQUENCY: 2x/week  PT DURATION: 8 weeks  PLANNED INTERVENTIONS: Therapeutic exercises, Therapeutic activity, Neuromuscular re-education, Balance training, Gait training, Patient/Family education, Self Care, Joint mobilization, Joint manipulation, Stair training, Aquatic Therapy, Dry Needling, Electrical stimulation, Spinal manipulation, Spinal mobilization, Cryotherapy, Moist heat, Taping, Traction, Ultrasound, Ionotophoresis 4mg /ml Dexamethasone, Manual therapy, and Re-evaluation.  PLAN FOR NEXT SESSION: Assess response to dry needling, continue with prone lumbar stab and clams/ABD. Work on [employer]. Progress HEP as indicated, strengthening, flexibility, core stability   Solon Palm, PT  11/22/22, 5:13 PM  [facility] [address], [address] [place], [place] [address] Phone # [phone] Fax [phone]

## 2022-11-22 ENCOUNTER — Ambulatory Visit: Payer: Medicare Other | Admitting: Physical Therapy

## 2022-11-22 ENCOUNTER — Encounter: Payer: Self-pay | Admitting: Physical Therapy

## 2022-11-22 DIAGNOSIS — M6281 Muscle weakness (generalized): Secondary | ICD-10-CM

## 2022-11-22 DIAGNOSIS — M5459 Other low back pain: Secondary | ICD-10-CM

## 2022-11-22 DIAGNOSIS — R2689 Other abnormalities of gait and mobility: Secondary | ICD-10-CM | POA: Diagnosis not present

## 2022-11-22 DIAGNOSIS — R252 Cramp and spasm: Secondary | ICD-10-CM | POA: Diagnosis not present

## 2022-11-22 DIAGNOSIS — R262 Difficulty in walking, not elsewhere classified: Secondary | ICD-10-CM | POA: Diagnosis not present

## 2022-11-24 ENCOUNTER — Encounter: Payer: Self-pay | Admitting: Rehabilitative and Restorative Service Providers"

## 2022-11-24 ENCOUNTER — Ambulatory Visit: Payer: Medicare Other | Attending: Family | Admitting: Rehabilitative and Restorative Service Providers"

## 2022-11-24 DIAGNOSIS — M6281 Muscle weakness (generalized): Secondary | ICD-10-CM

## 2022-11-24 DIAGNOSIS — R262 Difficulty in walking, not elsewhere classified: Secondary | ICD-10-CM | POA: Insufficient documentation

## 2022-11-24 DIAGNOSIS — M5459 Other low back pain: Secondary | ICD-10-CM

## 2022-11-24 DIAGNOSIS — R252 Cramp and spasm: Secondary | ICD-10-CM

## 2022-11-24 DIAGNOSIS — R2689 Other abnormalities of gait and mobility: Secondary | ICD-10-CM

## 2022-11-24 NOTE — Therapy (Signed)
OUTPATIENT PHYSICAL THERAPY TREATMENT NOTE   Patient Name: Barbara Maxwell MRN: [id number] DOB:[birth year], 87 y.o., female Today's Date: 11/24/2022  END OF SESSION:  PT End of Session - 11/24/22 1018     Visit Number 9    Date for PT Re-Evaluation 12/17/22    Authorization Type Medicare    Progress Note Due on Visit 10    PT Start Time 1015    PT Stop Time 1055    PT Time Calculation (min) 40 min    Activity Tolerance Patient tolerated treatment well    Behavior During Therapy WFL for tasks assessed/performed                  Past Medical History:  Diagnosis Date   Anemia    B12 deficiency    Cancer (HCC)    [facility]-  MELANOMA REMOVED FROM BACK    Coronary artery disease    Depression    Hypercholesterolemia    Hypertension    Past Surgical History:  Procedure Laterality Date   ABDOMINAL HYSTERECTOMY     VAGINAL HYSTERECTOMY WITH OVARIAN PRESERVATION    ABDOMINOPLASTY  1998   APPENDECTOMY     BREAST SURGERY  1985   BREAST REDUCTION    CARDIAC CATHETERIZATION  11/19/2019   CORONARY BALLOON ANGIOPLASTY N/A 11/19/2019   Procedure: CORONARY BALLOON ANGIOPLASTY;  Surgeon: Runell Gess, MD;  Location: [facility];  Service: Cardiovascular;  Laterality: N/A;   LEFT HEART CATH AND CORONARY ANGIOGRAPHY N/A 11/19/2019   Procedure: LEFT HEART CATH AND CORONARY ANGIOGRAPHY;  Surgeon: Runell Gess, MD;  Location: [facility];  Service: Cardiovascular;  Laterality: N/A;   TONSILLECTOMY AND ADENOIDECTOMY     Patient Active Problem List   Diagnosis Date Noted   Lumbar compression fracture (HCC) 08/31/2022   Chronic low back pain without sciatica 11/07/2020   Antiplatelet or antithrombotic long-term use 06/13/2020   Iron deficiency anemia 06/02/2020   CAD (coronary artery disease) 11/19/2019   Atypical chest pain 09/28/2019   Dyspnea on exertion 09/28/2019   H/O vitamin D deficiency 01/21/2015   Osteopenia 01/21/2015   Vaginal atrophy  01/21/2015   Mixed hyperlipidemia 02/04/2014   Family history of ovarian cancer 11/13/2012   Rectocele 11/07/2012   Hyperlipidemia 09/29/2012   Essential hypertension 08/07/2009   ALLERGIC RHINITIS 08/07/2009   HYPERSOMNIA WITH SLEEP APNEA UNSPECIFIED 08/04/2009    PCP: [name], FNP  REFERRING PROVIDER: [name], FNP  REFERRING DIAG: [phone] (ICD-10-CM) - Chronic low back pain without sciatica, unspecified back pain laterality  Rationale for Evaluation and Treatment: Rehabilitation  THERAPY DIAG:  Other low back pain  Cramp and spasm  Muscle weakness (generalized)  Other abnormalities of gait and mobility  ONSET DATE: a month ago  SUBJECTIVE:  SUBJECTIVE STATEMENT: Pt reports feeling a lot better after starting dry needling.  Patient reports overall feeling at least 70% better since starting PT.  Reports that she is still waking up feeling some soreness in the morning "but I haven't been taking any tylenol."  PERTINENT HISTORY:  Name pronounced [name], HTN, B12 Deficiency  PAIN:  Are you having pain? Yes: NPRS scale: 5/10 Pain location: right sided low back Pain description: burning, throbbing, aching Aggravating factors: too much activity Relieving factors: medication, rest  PRECAUTIONS: Fall  RED FLAGS: Compression fracture: Yes: L4     FALLS:  Has patient fallen in last 6 months? Yes. Number of falls 1 fall last night  LIVING ENVIRONMENT: Lives with: lives alone in Independent Living Lives in: House/apartment Stairs: No Has following equipment at home: Single point cane, Walker - 4 wheeled, shower chair, bed side commode, and Grab bars  OCCUPATION: Retired  PLOF: Independent and Leisure: walking dog, water aerobics 2x/week,  going out to dinner with friends, going to church and church activities  PATIENT GOALS: To decrease pain and be able to do desired activities  NEXT MD VISIT: Dr [name] on Feb 14, 2023  OBJECTIVE:   DIAGNOSTIC FINDINGS:  Lumbar MRI on 07/25/2022: IMPRESSION: 1. Acute or subacute L4 compression fracture with 35% height loss. 2. Diffuse lumbar disc and facet degeneration without spinal stenosis. 3. Moderate neural foraminal stenosis at L3-4 and L5-S1.  PATIENT SURVEYS:  Eval:  FOTO 57 (projected 39 by visit 10) 11/24/2022:  FOTO 67 (goal met)   COGNITION: Overall cognitive status: Within functional limits for tasks assessed     SENSATION: Pt states that she sometimes has some numbness and tingling in her right left arm  MUSCLE LENGTH: Hamstrings: WFL  POSTURE: rounded shoulders  PALPATION: Tightness/muscle spasms noted on bilateral lumbar paraspinals, especially right  LUMBAR ROM:   Eval:  Limited with right lumbar rotation with increased pain.  LOWER EXTREMITY ROM:     WFL  LOWER EXTREMITY MMT:    Eval:  Right hip strength grossly 4/5 Left hip strength grossly 4+/5 Bilat quads WFL Bilat hamstrings 4+/5  LUMBAR SPECIAL TESTS:  Eval:  Slump test: Positive on right  FUNCTIONAL TESTS:  Eval: 5 times sit to stand: 11.2 sec Single leg Stance: right- 1.68 sec, left- 1.45 sec   11/15/2022: 6 Minute Walk Test: 1,290 ft  11/24/2022: 6 Minute Walk Test: 1,325 ft with RPE of 2-3/10  GAIT: Distance walked: > 500 ft Assistive device utilized: None Level of assistance: Complete Independence Comments: Pt with antalgic gait, but states that she has had more pain since her fall last night  TODAY'S TREATMENT:                                                                                                                              DATE:  11/24/2022 Nustep x 6 min level 5 - discussing status FOTO Seated hamstring stretch 2x20 sec bilat  Seated piriformis stretch 2x20  sec bilat 6 minute ambulation around PT gyms (1,325 ft) Seated side-bending over peanut ball x15 bilat Sit to/from stand holding 5# kettlebell:  x10 with chest press, x10 with overhead press Standing alt LE 2 cone tap with CGA 2x10   11/22/2022 Nustep x 6 min level 5 - discussing status Prone hip ext over one pillow 2 x 10  Prone M retraction 2x 10, Prone T 2x10 Sidelying clam x 20 B with green loop Sidelying hip ABD x 15 Trigger Point Dry-Needling  Treatment instructions: Expect mild to moderate muscle soreness. S/S of pneumothorax if dry needled over a lung field, and to seek immediate medical attention should they occur. Patient verbalized understanding of these instructions and education. Patient Consent Given: Yes Education handout provided: Previously provided Muscles treated: R thoracic paraspinals Electrical stimulation performed: No Parameters: N/A Treatment response/outcome: Twitch Response Elicited and Palpable Increase in Muscle Length  Skilled palpation and monitoring of soft tissues during DN; STM to R thoracic paraspinals.  11/18/2022 Nustep level 5 x6 min with PT present to discuss status Seated hamstring stretch 2x20 sec bilat Seated piriformis stretch 2x20 sec bilat Standing at counter:  heel raises, hip abduction, hip extension. 2x10 each bilat Seated 3 way green pball rollout 3x10 sec each direction Trigger Point Dry-Needling  Treatment instructions: Expect mild to moderate muscle soreness. S/S of pneumothorax if dry needled over a lung field, and to seek immediate medical attention should they occur. Patient verbalized understanding of these instructions and education. Patient Consent Given: Yes Education handout provided: Yes Muscles treated: right side lumbar multifidi Electrical stimulation performed: No Parameters: N/A Treatment response/outcome: Utilized skilled palpation to identify bony landmarks and trigger points.  Able to illicit twitch response and  muscle elongation.  Soft tissue mobilization following to further promote tissue elongation to bilateral lumbar paraspinals.     PATIENT EDUCATION:  Education details: HEP update Person educated: Patient Education method: Explanation, Demonstration, and Handouts Education comprehension: verbalized understanding  HOME EXERCISE PROGRAM: Access Code: [email or web address] URL: [email or web address] Date: 11/15/2022 Prepared by: Raynelle Fanning  Exercises - Standing Single Leg Stance with Counter Support  - 1 x daily - 7 x weekly - 2 reps - 20 sec hold - Standing Tandem Balance with Counter Support  - 1 x daily - 7 x weekly - 2 reps - 20 sec hold - Standing March with Counter Support  - 1 x daily - 7 x weekly - 2 sets - 10 reps - Standing Hip Abduction with Counter Support  - 1 x daily - 7 x weekly - 2 sets - 10 reps - Standing Hip Extension with Counter Support  - 1 x daily - 7 x weekly - 2 sets - 10 reps - Standing Bilateral Low Shoulder Row with Anchored Resistance  - 1 x daily - 3 x weekly - 2 sets - 10 reps - Standing Shoulder Horizontal Abduction with Resistance  - 1 x daily - 3 x weekly - 2 sets - 10 reps - Shoulder extension with resistance - Neutral  - 1 x daily - 3 x weekly - 2 sets - 10 reps - Prone Hip Extension with Pillow Under Abdomen  - 1 x daily - 3-4 x weekly - 2-3 sets - 10 reps - Seated Piriformis Stretch with Trunk Bend  - 2 x daily - 7 x weekly - 1 sets - 3 reps - 30-60 sec  hold  Patient Education - Trigger Point Dry Needling   ASSESSMENT:  CLINICAL  IMPRESSION: Ms Rauls presents to skilled PT with reports of feeling at least 70% better since starting PT.  Patient states that she only has pain in one specific spot on her right low back.  Patient with improved distance noted today with 6 minute walk test and has now met FOTO goal.  Patient does require CGA with standing balance task.  Patient continues to progress towards goals and is track for discharge at end of  certification.  OBJECTIVE IMPAIRMENTS: decreased balance, decreased ROM, decreased strength, increased muscle spasms, postural dysfunction, and pain.   ACTIVITY LIMITATIONS: carrying, lifting, bending, sleeping, stairs, and reach over head  PARTICIPATION LIMITATIONS: cleaning, shopping, community activity, and church  PERSONAL FACTORS: Time since onset of injury/illness/exacerbation and 1-2 comorbidities: Osteopenia, Lumbar Compression Fx at L4  are also affecting patient's functional outcome.   REHAB POTENTIAL: Good  CLINICAL DECISION MAKING: Stable/uncomplicated  EVALUATION COMPLEXITY: Low   GOALS: Goals reviewed with patient? Yes  SHORT TERM GOALS: Target date: 11/12/2022  Pt will be independent with initial HEP. Baseline: Goal status: MET  2.  Pt will report at least a 30% improvement in pain/symptoms since initial evaluation. Baseline:  Goal status: MET    LONG TERM GOALS: Target date: 12/17/2022  Pt will be independent with advanced HEP to allow for self-progression. Baseline:  Goal status: Ongoing  2.  Pt will increase FOTO to at least 67 to demonstrate improvements in functional mobility. Baseline:  Goal status: MET on 11/24/2022  3.  Pt will increase right hip strength to at least 4+/5 to allow her for perform community tasks with increased ease. Baseline:  Goal status: INITIAL  4.  Pt will report ability to walk her dog for greater than 15-20 minutes without any increased pain or loss of balance. Baseline:  Goal status: Ongoing  5.  Pt will increase single leg stance time at greater than 10 sec on either leg to demonstrate decreased risk of falling. Baseline:  Goal status: INITIAL    PLAN:  PT FREQUENCY: 2x/week  PT DURATION: 8 weeks  PLANNED INTERVENTIONS: Therapeutic exercises, Therapeutic activity, Neuromuscular re-education, Balance training, Gait training, Patient/Family education, Self Care, Joint mobilization, Joint manipulation, Stair  training, Aquatic Therapy, Dry Needling, Electrical stimulation, Spinal manipulation, Spinal mobilization, Cryotherapy, Moist heat, Taping, Traction, Ultrasound, Ionotophoresis 4mg /ml Dexamethasone, Manual therapy, and Re-evaluation.  PLAN FOR NEXT SESSION: Assess response to dry needling, continue with prone lumbar stab and clams/ABD. Work on [employer]. Progress HEP as indicated, strengthening, flexibility, core stability   Reather Laurence, PT, DPT 11/24/22, 11:31 AM  [facility] [address] [place], [place] [address] Phone # [phone] Fax [phone]

## 2022-11-29 ENCOUNTER — Encounter: Payer: Self-pay | Admitting: Rehabilitative and Restorative Service Providers"

## 2022-11-29 ENCOUNTER — Ambulatory Visit: Payer: Medicare Other | Admitting: Rehabilitative and Restorative Service Providers"

## 2022-11-29 DIAGNOSIS — R2689 Other abnormalities of gait and mobility: Secondary | ICD-10-CM

## 2022-11-29 DIAGNOSIS — M5459 Other low back pain: Secondary | ICD-10-CM | POA: Diagnosis not present

## 2022-11-29 DIAGNOSIS — R262 Difficulty in walking, not elsewhere classified: Secondary | ICD-10-CM | POA: Diagnosis not present

## 2022-11-29 DIAGNOSIS — M6281 Muscle weakness (generalized): Secondary | ICD-10-CM

## 2022-11-29 DIAGNOSIS — R252 Cramp and spasm: Secondary | ICD-10-CM

## 2022-11-29 NOTE — Therapy (Signed)
OUTPATIENT PHYSICAL THERAPY TREATMENT NOTE   Patient Name: Barbara Maxwell MRN: [id number] DOB:[birth year], 87 y.o., female Today's Date: 11/29/2022   Progress Note Reporting Period 10/22/2022 to 11/29/2022  See note below for Objective Data and Assessment of Progress/Goals.       END OF SESSION:  PT End of Session - 11/29/22 0936     Visit Number 10    Date for PT Re-Evaluation 12/17/22    Authorization Type Medicare    Progress Note Due on Visit 20    PT Start Time 0930    PT Stop Time 1010    PT Time Calculation (min) 40 min    Activity Tolerance Patient tolerated treatment well    Behavior During Therapy WFL for tasks assessed/performed                  Past Medical History:  Diagnosis Date   Anemia    B12 deficiency    Cancer (HCC)    [facility]-  MELANOMA REMOVED FROM BACK    Coronary artery disease    Depression    Hypercholesterolemia    Hypertension    Past Surgical History:  Procedure Laterality Date   ABDOMINAL HYSTERECTOMY     VAGINAL HYSTERECTOMY WITH OVARIAN PRESERVATION    ABDOMINOPLASTY  1998   APPENDECTOMY     BREAST SURGERY  1985   BREAST REDUCTION    CARDIAC CATHETERIZATION  11/19/2019   CORONARY BALLOON ANGIOPLASTY N/A 11/19/2019   Procedure: CORONARY BALLOON ANGIOPLASTY;  Surgeon: Runell Gess, MD;  Location: [facility];  Service: Cardiovascular;  Laterality: N/A;   LEFT HEART CATH AND CORONARY ANGIOGRAPHY N/A 11/19/2019   Procedure: LEFT HEART CATH AND CORONARY ANGIOGRAPHY;  Surgeon: Runell Gess, MD;  Location: [facility];  Service: Cardiovascular;  Laterality: N/A;   TONSILLECTOMY AND ADENOIDECTOMY     Patient Active Problem List   Diagnosis Date Noted   Lumbar compression fracture (HCC) 08/31/2022   Chronic low back pain without sciatica 11/07/2020   Antiplatelet or antithrombotic long-term use 06/13/2020   Iron deficiency anemia 06/02/2020   CAD (coronary artery disease) 11/19/2019   Atypical  chest pain 09/28/2019   Dyspnea on exertion 09/28/2019   H/O vitamin D deficiency 01/21/2015   Osteopenia 01/21/2015   Vaginal atrophy 01/21/2015   Mixed hyperlipidemia 02/04/2014   Family history of ovarian cancer 11/13/2012   Rectocele 11/07/2012   Hyperlipidemia 09/29/2012   Essential hypertension 08/07/2009   ALLERGIC RHINITIS 08/07/2009   HYPERSOMNIA WITH SLEEP APNEA UNSPECIFIED 08/04/2009    PCP: [name], FNP  REFERRING PROVIDER: [name], FNP  REFERRING DIAG: [phone] (ICD-10-CM) - Chronic low back pain without sciatica, unspecified back pain laterality  Rationale for Evaluation and Treatment: Rehabilitation  THERAPY DIAG:  Other low back pain  Cramp and spasm  Muscle weakness (generalized)  Other abnormalities of gait and mobility  Difficulty in walking, not elsewhere classified  ONSET DATE: a month ago  SUBJECTIVE:  SUBJECTIVE STATEMENT: Pt reports that she is feeling better, overall, but she still has the pain in the one spot on the right side of her lumbar region.  PERTINENT HISTORY:  Name pronounced [name], HTN, B12 Deficiency  PAIN:  Are you having pain? Yes: NPRS scale: 3/10 Pain location: right sided low back Pain description: burning, throbbing, aching Aggravating factors: too much activity Relieving factors: medication, rest  PRECAUTIONS: Fall  RED FLAGS: Compression fracture: Yes: L4     FALLS:  Has patient fallen in last 6 months? Yes. Number of falls 1 fall last night  LIVING ENVIRONMENT: Lives with: lives alone in Independent Living Lives in: House/apartment Stairs: No Has following equipment at home: Single point cane, Walker - 4 wheeled, shower chair, bed side commode, and Grab bars  OCCUPATION:  Retired  PLOF: Independent and Leisure: walking dog, water aerobics 2x/week, going out to dinner with friends, going to church and church activities  PATIENT GOALS: To decrease pain and be able to do desired activities  NEXT MD VISIT: Dr [name] on Feb 14, 2023  OBJECTIVE:   DIAGNOSTIC FINDINGS:  Lumbar MRI on 07/25/2022: IMPRESSION: 1. Acute or subacute L4 compression fracture with 35% height loss. 2. Diffuse lumbar disc and facet degeneration without spinal stenosis. 3. Moderate neural foraminal stenosis at L3-4 and L5-S1.  PATIENT SURVEYS:  Eval:  FOTO 57 (projected 46 by visit 10) 11/24/2022:  FOTO 67 (goal met)   COGNITION: Overall cognitive status: Within functional limits for tasks assessed     SENSATION: Pt states that she sometimes has some numbness and tingling in her right left arm  MUSCLE LENGTH: Hamstrings: WFL  POSTURE: rounded shoulders  PALPATION: Tightness/muscle spasms noted on bilateral lumbar paraspinals, especially right  LUMBAR ROM:   Eval:  Limited with right lumbar rotation with increased pain.  LOWER EXTREMITY ROM:     WFL  LOWER EXTREMITY MMT:    Eval:  Right hip strength grossly 4/5 Left hip strength grossly 4+/5 Bilat quads WFL Bilat hamstrings 4+/5  11/29/2022: Bilateral hip strength grossly 4 to 4+/5 throughout  LUMBAR SPECIAL TESTS:  Eval:  Slump test: Positive on right  FUNCTIONAL TESTS:  Eval: 5 times sit to stand: 11.2 sec Single leg Stance: right- 1.68 sec, left- 1.45 sec   11/15/2022: 6 Minute Walk Test: 1,290 ft  11/24/2022: 6 Minute Walk Test: 1,325 ft with RPE of 2-3/10  11/29/2022: 5 times sit to stand: 10.76 sec Single leg Stance: right- 13.64 sec, left- 8.88 sec   GAIT: Distance walked: > 500 ft Assistive device utilized: None Level of assistance: Complete Independence Comments: Pt with antalgic gait, but states that she has had more pain since her fall last night  TODAY'S TREATMENT:                                                                                                                               DATE:  11/29/2022 Nustep level 5  x6 min with PT present to discuss status 5 times sit to stand and single leg stance Standing alt LE 2 cone tap with CGA 2x10 Sit to/from stand holding 5# kettlebell:  x10 with chest press, x10 with overhead press Seated side-bending over peanut ball x15 bilat Trigger Point Dry-Needling  Treatment instructions: Expect mild to moderate muscle soreness. S/S of pneumothorax if dry needled over a lung field, and to seek immediate medical attention should they occur. Patient verbalized understanding of these instructions and education. Patient Consent Given: Yes Education handout provided: Yes Muscles treated: right side lumbar multifidi Electrical stimulation performed: No Parameters: N/A Treatment response/outcome: Utilized skilled palpation to identify bony landmarks and trigger points.  Able to illicit twitch response and muscle elongation.  Soft tissue mobilization following to further promote tissue elongation to bilateral lumbar paraspinals.    11/24/2022 Nustep x 6 min level 5 - discussing status FOTO Seated hamstring stretch 2x20 sec bilat Seated piriformis stretch 2x20 sec bilat 6 minute ambulation around PT gyms (1,325 ft) Seated side-bending over peanut ball x15 bilat Sit to/from stand holding 5# kettlebell:  x10 with chest press, x10 with overhead press Standing alt LE 2 cone tap with CGA 2x10   11/22/2022 Nustep x 6 min level 5 - discussing status Prone hip ext over one pillow 2 x 10  Prone M retraction 2x 10, Prone T 2x10 Sidelying clam x 20 B with green loop Sidelying hip ABD x 15 Trigger Point Dry-Needling  Treatment instructions: Expect mild to moderate muscle soreness. S/S of pneumothorax if dry needled over a lung field, and to seek immediate medical attention should they occur. Patient verbalized understanding of these instructions and  education. Patient Consent Given: Yes Education handout provided: Previously provided Muscles treated: R thoracic paraspinals Electrical stimulation performed: No Parameters: N/A Treatment response/outcome: Twitch Response Elicited and Palpable Increase in Muscle Length  Skilled palpation and monitoring of soft tissues during DN; STM to R thoracic paraspinals.    PATIENT EDUCATION:  Education details: HEP update Person educated: Patient Education method: Explanation, Demonstration, and Handouts Education comprehension: verbalized understanding  HOME EXERCISE PROGRAM: Access Code: [email or web address] URL: [email or web address] Date: 11/15/2022 Prepared by: Raynelle Fanning  Exercises - Standing Single Leg Stance with Counter Support  - 1 x daily - 7 x weekly - 2 reps - 20 sec hold - Standing Tandem Balance with Counter Support  - 1 x daily - 7 x weekly - 2 reps - 20 sec hold - Standing March with Counter Support  - 1 x daily - 7 x weekly - 2 sets - 10 reps - Standing Hip Abduction with Counter Support  - 1 x daily - 7 x weekly - 2 sets - 10 reps - Standing Hip Extension with Counter Support  - 1 x daily - 7 x weekly - 2 sets - 10 reps - Standing Bilateral Low Shoulder Row with Anchored Resistance  - 1 x daily - 3 x weekly - 2 sets - 10 reps - Standing Shoulder Horizontal Abduction with Resistance  - 1 x daily - 3 x weekly - 2 sets - 10 reps - Shoulder extension with resistance - Neutral  - 1 x daily - 3 x weekly - 2 sets - 10 reps - Prone Hip Extension with Pillow Under Abdomen  - 1 x daily - 3-4 x weekly - 2-3 sets - 10 reps - Seated Piriformis Stretch with Trunk Bend  - 2 x daily - 7 x weekly - 1 sets - 3 reps -  30-60 sec  hold  Patient Education - Trigger Point Dry Needling   ASSESSMENT:  CLINICAL IMPRESSION: Ms Cortese presents to skilled PT with reports of overall less pain in lumbar region than last visit.  Patient does state that overall, she does not have pain, just in that  one spot.  Patient with improved time noted on 5 times sit to stand and improved time on single leg stance.  Patient had met FOTO goal last time assessed and is improving on walking tolerance.  Patient continues to require close SBA/CGA with 2 cone tapping exercise secondary to balance.  Patient continues to report decreased overall pain following dry needling and manual therapy with soft tissue mobilization.  Patient is progressing appropriately with all goals.  OBJECTIVE IMPAIRMENTS: decreased balance, decreased ROM, decreased strength, increased muscle spasms, postural dysfunction, and pain.   ACTIVITY LIMITATIONS: carrying, lifting, bending, sleeping, stairs, and reach over head  PARTICIPATION LIMITATIONS: cleaning, shopping, community activity, and church  PERSONAL FACTORS: Time since onset of injury/illness/exacerbation and 1-2 comorbidities: Osteopenia, Lumbar Compression Fx at L4  are also affecting patient's functional outcome.   REHAB POTENTIAL: Good  CLINICAL DECISION MAKING: Stable/uncomplicated  EVALUATION COMPLEXITY: Low   GOALS: Goals reviewed with patient? Yes  SHORT TERM GOALS: Target date: 11/12/2022  Pt will be independent with initial HEP. Baseline: Goal status: MET  2.  Pt will report at least a 30% improvement in pain/symptoms since initial evaluation. Baseline:  Goal status: MET    LONG TERM GOALS: Target date: 12/17/2022  Pt will be independent with advanced HEP to allow for self-progression. Baseline:  Goal status: Ongoing  2.  Pt will increase FOTO to at least 67 to demonstrate improvements in functional mobility. Baseline:  Goal status: MET on 11/24/2022  3.  Pt will increase right hip strength to at least 4+/5 to allow her for perform community tasks with increased ease. Baseline:  Goal status: Ongoing  4.  Pt will report ability to walk her dog for greater than 15-20 minutes without any increased pain or loss of balance. Baseline:  Goal  status: Ongoing  5.  Pt will increase single leg stance time at greater than 10 sec on either leg to demonstrate decreased risk of falling. Baseline:  Goal status: Ongoing (see above)    PLAN:  PT FREQUENCY: 2x/week  PT DURATION: 8 weeks  PLANNED INTERVENTIONS: Therapeutic exercises, Therapeutic activity, Neuromuscular re-education, Balance training, Gait training, Patient/Family education, Self Care, Joint mobilization, Joint manipulation, Stair training, Aquatic Therapy, Dry Needling, Electrical stimulation, Spinal manipulation, Spinal mobilization, Cryotherapy, Moist heat, Taping, Traction, Ultrasound, Ionotophoresis 4mg /ml Dexamethasone, Manual therapy, and Re-evaluation.  PLAN FOR NEXT SESSION: Assess response to dry needling, Progress HEP as indicated, strengthening, flexibility, core stability   Reather Laurence, PT, DPT 11/29/22, 10:41 AM  [facility] [address], [address] [place], [place] [address] Phone # [phone] Fax [phone]

## 2022-12-01 ENCOUNTER — Ambulatory Visit: Payer: Medicare Other | Admitting: Rehabilitative and Restorative Service Providers"

## 2022-12-01 ENCOUNTER — Encounter: Payer: Self-pay | Admitting: Rehabilitative and Restorative Service Providers"

## 2022-12-01 DIAGNOSIS — R2689 Other abnormalities of gait and mobility: Secondary | ICD-10-CM | POA: Diagnosis not present

## 2022-12-01 DIAGNOSIS — R262 Difficulty in walking, not elsewhere classified: Secondary | ICD-10-CM | POA: Diagnosis not present

## 2022-12-01 DIAGNOSIS — M6281 Muscle weakness (generalized): Secondary | ICD-10-CM

## 2022-12-01 DIAGNOSIS — R252 Cramp and spasm: Secondary | ICD-10-CM | POA: Diagnosis not present

## 2022-12-01 DIAGNOSIS — M5459 Other low back pain: Secondary | ICD-10-CM | POA: Diagnosis not present

## 2022-12-01 NOTE — Therapy (Signed)
OUTPATIENT PHYSICAL THERAPY TREATMENT NOTE   Patient Name: Barbara Maxwell MRN: [id number] DOB:[birth year], 87 y.o., female Today's Date: 12/01/2022   Progress Note Reporting Period 10/22/2022 to 11/29/2022  See note below for Objective Data and Assessment of Progress/Goals.       END OF SESSION:  PT End of Session - 12/01/22 1019     Visit Number 11    Date for PT Re-Evaluation 12/17/22    Authorization Type Medicare    Progress Note Due on Visit 20    PT Start Time 1015    PT Stop Time 1055    PT Time Calculation (min) 40 min    Activity Tolerance Patient tolerated treatment well    Behavior During Therapy WFL for tasks assessed/performed                  Past Medical History:  Diagnosis Date   Anemia    B12 deficiency    Cancer (HCC)    [facility]-  MELANOMA REMOVED FROM BACK    Coronary artery disease    Depression    Hypercholesterolemia    Hypertension    Past Surgical History:  Procedure Laterality Date   ABDOMINAL HYSTERECTOMY     VAGINAL HYSTERECTOMY WITH OVARIAN PRESERVATION    ABDOMINOPLASTY  1998   APPENDECTOMY     BREAST SURGERY  1985   BREAST REDUCTION    CARDIAC CATHETERIZATION  11/19/2019   CORONARY BALLOON ANGIOPLASTY N/A 11/19/2019   Procedure: CORONARY BALLOON ANGIOPLASTY;  Surgeon: Runell Gess, MD;  Location: [facility];  Service: Cardiovascular;  Laterality: N/A;   LEFT HEART CATH AND CORONARY ANGIOGRAPHY N/A 11/19/2019   Procedure: LEFT HEART CATH AND CORONARY ANGIOGRAPHY;  Surgeon: Runell Gess, MD;  Location: [facility];  Service: Cardiovascular;  Laterality: N/A;   TONSILLECTOMY AND ADENOIDECTOMY     Patient Active Problem List   Diagnosis Date Noted   Lumbar compression fracture (HCC) 08/31/2022   Chronic low back pain without sciatica 11/07/2020   Antiplatelet or antithrombotic long-term use 06/13/2020   Iron deficiency anemia 06/02/2020   CAD (coronary artery disease) 11/19/2019   Atypical  chest pain 09/28/2019   Dyspnea on exertion 09/28/2019   H/O vitamin D deficiency 01/21/2015   Osteopenia 01/21/2015   Vaginal atrophy 01/21/2015   Mixed hyperlipidemia 02/04/2014   Family history of ovarian cancer 11/13/2012   Rectocele 11/07/2012   Hyperlipidemia 09/29/2012   Essential hypertension 08/07/2009   ALLERGIC RHINITIS 08/07/2009   HYPERSOMNIA WITH SLEEP APNEA UNSPECIFIED 08/04/2009    PCP: [name], FNP  REFERRING PROVIDER: [name], FNP  REFERRING DIAG: [phone] (ICD-10-CM) - Chronic low back pain without sciatica, unspecified back pain laterality  Rationale for Evaluation and Treatment: Rehabilitation  THERAPY DIAG:  Other low back pain  Cramp and spasm  Muscle weakness (generalized)  Other abnormalities of gait and mobility  ONSET DATE: a month ago  SUBJECTIVE:  SUBJECTIVE STATEMENT: Pt reports that her pain is worst in the morning and at the end of the day if she has been active.  PERTINENT HISTORY:  Name pronounced [name], HTN, B12 Deficiency  PAIN:  Are you having pain? Yes: NPRS scale: 3-4/10 Pain location: right sided low back Pain description: burning, throbbing, aching Aggravating factors: too much activity Relieving factors: medication, rest  PRECAUTIONS: Fall  RED FLAGS: Compression fracture: Yes: L4     FALLS:  Has patient fallen in last 6 months? Yes. Number of falls 1 fall last night  LIVING ENVIRONMENT: Lives with: lives alone in Independent Living Lives in: House/apartment Stairs: No Has following equipment at home: Single point cane, Walker - 4 wheeled, shower chair, bed side commode, and Grab bars  OCCUPATION: Retired  PLOF: Independent and Leisure: walking dog, water aerobics 2x/week, going  out to dinner with friends, going to church and church activities  PATIENT GOALS: To decrease pain and be able to do desired activities  NEXT MD VISIT: Dr [name] on Feb 14, 2023  OBJECTIVE:   DIAGNOSTIC FINDINGS:  Lumbar MRI on 07/25/2022: IMPRESSION: 1. Acute or subacute L4 compression fracture with 35% height loss. 2. Diffuse lumbar disc and facet degeneration without spinal stenosis. 3. Moderate neural foraminal stenosis at L3-4 and L5-S1.  PATIENT SURVEYS:  Eval:  FOTO 57 (projected 36 by visit 10) 11/24/2022:  FOTO 67 (goal met)   COGNITION: Overall cognitive status: Within functional limits for tasks assessed     SENSATION: Pt states that she sometimes has some numbness and tingling in her right left arm  MUSCLE LENGTH: Hamstrings: WFL  POSTURE: rounded shoulders  PALPATION: Tightness/muscle spasms noted on bilateral lumbar paraspinals, especially right  LUMBAR ROM:   Eval:  Limited with right lumbar rotation with increased pain.  LOWER EXTREMITY ROM:     WFL  LOWER EXTREMITY MMT:    Eval:  Right hip strength grossly 4/5 Left hip strength grossly 4+/5 Bilat quads WFL Bilat hamstrings 4+/5  11/29/2022: Bilateral hip strength grossly 4 to 4+/5 throughout  LUMBAR SPECIAL TESTS:  Eval:  Slump test: Positive on right  FUNCTIONAL TESTS:  Eval: 5 times sit to stand: 11.2 sec Single leg Stance: right- 1.68 sec, left- 1.45 sec   11/15/2022: 6 Minute Walk Test: 1,290 ft  11/24/2022: 6 Minute Walk Test: 1,325 ft with RPE of 2-3/10  11/29/2022: 5 times sit to stand: 10.76 sec Single leg Stance: right- 13.64 sec, left- 8.88 sec   GAIT: Distance walked: > 500 ft Assistive device utilized: None Level of assistance: Complete Independence Comments: Pt with antalgic gait, but states that she has had more pain since her fall last night  TODAY'S TREATMENT:                                                                                                                               DATE:  12/01/2022 Nustep level 5 x6 min with PT  present to discuss status Sit to/from stand holding 5# kettlebell:  x10 with chest press, x10 with overhead press Standing alt LE 2 cone tap with SBA 2x10 Seated hamstring stretch 2x20 sec bilat Seated side-bending over peanut ball x15 bilat Seated modified deadlift with 6# dumbbell 2x10 Standing on foam pad in parallel bars:  hip abduction and hip extension.  2X10 bilat each Tandem gait in parallel bars down and back x2 laps    11/29/2022 Nustep level 5 x6 min with PT present to discuss status 5 times sit to stand and single leg stance Standing alt LE 2 cone tap with CGA 2x10 Sit to/from stand holding 5# kettlebell:  x10 with chest press, x10 with overhead press Seated side-bending over peanut ball x15 bilat Trigger Point Dry-Needling  Treatment instructions: Expect mild to moderate muscle soreness. S/S of pneumothorax if dry needled over a lung field, and to seek immediate medical attention should they occur. Patient verbalized understanding of these instructions and education. Patient Consent Given: Yes Education handout provided: Yes Muscles treated: right side lumbar multifidi Electrical stimulation performed: No Parameters: N/A Treatment response/outcome: Utilized skilled palpation to identify bony landmarks and trigger points.  Able to illicit twitch response and muscle elongation.  Soft tissue mobilization following to further promote tissue elongation to bilateral lumbar paraspinals.    11/24/2022 Nustep x 6 min level 5 - discussing status FOTO Seated hamstring stretch 2x20 sec bilat Seated piriformis stretch 2x20 sec bilat 6 minute ambulation around PT gyms (1,325 ft) Seated side-bending over peanut ball x15 bilat Sit to/from stand holding 5# kettlebell:  x10 with chest press, x10 with overhead press Standing alt LE 2 cone tap with CGA 2x10    PATIENT EDUCATION:  Education details: HEP update Person  educated: Patient Education method: Explanation, Demonstration, and Handouts Education comprehension: verbalized understanding  HOME EXERCISE PROGRAM: Access Code: [email or web address] URL: [email or web address] Date: 11/15/2022 Prepared by: Raynelle Fanning  Exercises - Standing Single Leg Stance with Counter Support  - 1 x daily - 7 x weekly - 2 reps - 20 sec hold - Standing Tandem Balance with Counter Support  - 1 x daily - 7 x weekly - 2 reps - 20 sec hold - Standing March with Counter Support  - 1 x daily - 7 x weekly - 2 sets - 10 reps - Standing Hip Abduction with Counter Support  - 1 x daily - 7 x weekly - 2 sets - 10 reps - Standing Hip Extension with Counter Support  - 1 x daily - 7 x weekly - 2 sets - 10 reps - Standing Bilateral Low Shoulder Row with Anchored Resistance  - 1 x daily - 3 x weekly - 2 sets - 10 reps - Standing Shoulder Horizontal Abduction with Resistance  - 1 x daily - 3 x weekly - 2 sets - 10 reps - Shoulder extension with resistance - Neutral  - 1 x daily - 3 x weekly - 2 sets - 10 reps - Prone Hip Extension with Pillow Under Abdomen  - 1 x daily - 3-4 x weekly - 2-3 sets - 10 reps - Seated Piriformis Stretch with Trunk Bend  - 2 x daily - 7 x weekly - 1 sets - 3 reps - 30-60 sec  hold  Patient Education - Trigger Point Dry Needling   ASSESSMENT:  CLINICAL IMPRESSION: Ms Demers presents to skilled PT with reporting that she did believe that the dry needling helped her feel some better.  Patient able to progress during  session today with strengthening and core stability.  Able to progress with balance with tandem and standing on foam pad for standing exercises.  Patient continues to progress with activity tolerance and in progressing appropriately with goal related activities.  OBJECTIVE IMPAIRMENTS: decreased balance, decreased ROM, decreased strength, increased muscle spasms, postural dysfunction, and pain.   ACTIVITY LIMITATIONS: carrying, lifting, bending,  sleeping, stairs, and reach over head  PARTICIPATION LIMITATIONS: cleaning, shopping, community activity, and church  PERSONAL FACTORS: Time since onset of injury/illness/exacerbation and 1-2 comorbidities: Osteopenia, Lumbar Compression Fx at L4  are also affecting patient's functional outcome.   REHAB POTENTIAL: Good  CLINICAL DECISION MAKING: Stable/uncomplicated  EVALUATION COMPLEXITY: Low   GOALS: Goals reviewed with patient? Yes  SHORT TERM GOALS: Target date: 11/12/2022  Pt will be independent with initial HEP. Baseline: Goal status: MET  2.  Pt will report at least a 30% improvement in pain/symptoms since initial evaluation. Baseline:  Goal status: MET    LONG TERM GOALS: Target date: 12/17/2022  Pt will be independent with advanced HEP to allow for self-progression. Baseline:  Goal status: Ongoing  2.  Pt will increase FOTO to at least 67 to demonstrate improvements in functional mobility. Baseline:  Goal status: MET on 11/24/2022  3.  Pt will increase right hip strength to at least 4+/5 to allow her for perform community tasks with increased ease. Baseline:  Goal status: Ongoing  4.  Pt will report ability to walk her dog for greater than 15-20 minutes without any increased pain or loss of balance. Baseline:  Goal status: Ongoing  5.  Pt will increase single leg stance time at greater than 10 sec on either leg to demonstrate decreased risk of falling. Baseline:  Goal status: Ongoing (see above)    PLAN:  PT FREQUENCY: 2x/week  PT DURATION: 8 weeks  PLANNED INTERVENTIONS: Therapeutic exercises, Therapeutic activity, Neuromuscular re-education, Balance training, Gait training, Patient/Family education, Self Care, Joint mobilization, Joint manipulation, Stair training, Aquatic Therapy, Dry Needling, Electrical stimulation, Spinal manipulation, Spinal mobilization, Cryotherapy, Moist heat, Taping, Traction, Ultrasound, Ionotophoresis 4mg /ml Dexamethasone,  Manual therapy, and Re-evaluation.  PLAN FOR NEXT SESSION: Assess response to dry needling, Progress HEP as indicated, strengthening, flexibility, core stability   Reather Laurence, PT, DPT 12/01/22, 11:05 AM  [facility] [address] [place], [place] [address] Phone # [phone] Fax [phone]

## 2022-12-06 ENCOUNTER — Encounter: Payer: Self-pay | Admitting: Rehabilitative and Restorative Service Providers"

## 2022-12-06 ENCOUNTER — Ambulatory Visit: Payer: Medicare Other | Admitting: Rehabilitative and Restorative Service Providers"

## 2022-12-06 DIAGNOSIS — R2689 Other abnormalities of gait and mobility: Secondary | ICD-10-CM | POA: Diagnosis not present

## 2022-12-06 DIAGNOSIS — M5459 Other low back pain: Secondary | ICD-10-CM

## 2022-12-06 DIAGNOSIS — M6281 Muscle weakness (generalized): Secondary | ICD-10-CM

## 2022-12-06 DIAGNOSIS — R252 Cramp and spasm: Secondary | ICD-10-CM | POA: Diagnosis not present

## 2022-12-06 DIAGNOSIS — R262 Difficulty in walking, not elsewhere classified: Secondary | ICD-10-CM | POA: Diagnosis not present

## 2022-12-06 NOTE — Therapy (Signed)
OUTPATIENT PHYSICAL THERAPY TREATMENT NOTE   Patient Name: Barbara Maxwell MRN: [id number] DOB:06-Jul-1935, 87 y.o., female Today's Date: 12/06/2022   Progress Note Reporting Period 10/22/2022 to 11/29/2022  See note below for Objective Data and Assessment of Progress/Goals.       END OF SESSION:  PT End of Session - 12/06/22 1234     Visit Number 12    Date for PT Re-Evaluation 12/17/22    Authorization Type Medicare    Progress Note Due on Visit 20    PT Start Time 1230    PT Stop Time 1310    PT Time Calculation (min) 40 min    Activity Tolerance Patient tolerated treatment well    Behavior During Therapy WFL for tasks assessed/performed                  Past Medical History:  Diagnosis Date   Anemia    B12 deficiency    Cancer (HCC)    [facility]-  MELANOMA REMOVED FROM BACK    Coronary artery disease    Depression    Hypercholesterolemia    Hypertension    Past Surgical History:  Procedure Laterality Date   ABDOMINAL HYSTERECTOMY     VAGINAL HYSTERECTOMY WITH OVARIAN PRESERVATION    ABDOMINOPLASTY  1998   APPENDECTOMY     BREAST SURGERY  1985   BREAST REDUCTION    CARDIAC CATHETERIZATION  11/19/2019   CORONARY BALLOON ANGIOPLASTY N/A 11/19/2019   Procedure: CORONARY BALLOON ANGIOPLASTY;  Surgeon: Runell Gess, MD;  Location: [facility];  Service: Cardiovascular;  Laterality: N/A;   LEFT HEART CATH AND CORONARY ANGIOGRAPHY N/A 11/19/2019   Procedure: LEFT HEART CATH AND CORONARY ANGIOGRAPHY;  Surgeon: Runell Gess, MD;  Location: [facility];  Service: Cardiovascular;  Laterality: N/A;   TONSILLECTOMY AND ADENOIDECTOMY     Patient Active Problem List   Diagnosis Date Noted   Lumbar compression fracture (HCC) 08/31/2022   Chronic low back pain without sciatica 11/07/2020   Antiplatelet or antithrombotic long-term use 06/13/2020   Iron deficiency anemia 06/02/2020   CAD (coronary artery disease) 11/19/2019   Atypical  chest pain 09/28/2019   Dyspnea on exertion 09/28/2019   H/O vitamin D deficiency 01/21/2015   Osteopenia 01/21/2015   Vaginal atrophy 01/21/2015   Mixed hyperlipidemia 02/04/2014   Family history of ovarian cancer 11/13/2012   Rectocele 11/07/2012   Hyperlipidemia 09/29/2012   Essential hypertension 08/07/2009   ALLERGIC RHINITIS 08/07/2009   HYPERSOMNIA WITH SLEEP APNEA UNSPECIFIED 08/04/2009    PCP: [name], FNP  REFERRING PROVIDER: [name], FNP  REFERRING DIAG: [phone] (ICD-10-CM) - Chronic low back pain without sciatica, unspecified back pain laterality  Rationale for Evaluation and Treatment: Rehabilitation  THERAPY DIAG:  Other low back pain  Cramp and spasm  Muscle weakness (generalized)  Other abnormalities of gait and mobility  ONSET DATE: a month ago  SUBJECTIVE:  SUBJECTIVE STATEMENT: Pt reports that when she wakes up in the morning and tries to get up out of bed, she does have some pain, but denies current pain.  PERTINENT HISTORY:  Name pronounced [name], HTN, B12 Deficiency  PAIN:  Are you having pain? Yes: NPRS scale: currently 0/10 Pain location: right sided low back Pain description: burning, throbbing, aching Aggravating factors: too much activity Relieving factors: medication, rest  PRECAUTIONS: Fall  RED FLAGS: Compression fracture: Yes: L4     FALLS:  Has patient fallen in last 6 months? Yes. Number of falls 1 fall last night  LIVING ENVIRONMENT: Lives with: lives alone in Independent Living Lives in: House/apartment Stairs: No Has following equipment at home: Single point cane, Walker - 4 wheeled, shower chair, bed side commode, and Grab bars  OCCUPATION: Retired  PLOF: Independent and Leisure:  walking dog, water aerobics 2x/week, going out to dinner with friends, going to church and church activities  PATIENT GOALS: To decrease pain and be able to do desired activities  NEXT MD VISIT: Dr [name] on Feb 14, 2023  OBJECTIVE:   DIAGNOSTIC FINDINGS:  Lumbar MRI on 07/25/2022: IMPRESSION: 1. Acute or subacute L4 compression fracture with 35% height loss. 2. Diffuse lumbar disc and facet degeneration without spinal stenosis. 3. Moderate neural foraminal stenosis at L3-4 and L5-S1.  PATIENT SURVEYS:  Eval:  FOTO 57 (projected 2 by visit 10) 11/24/2022:  FOTO 67 (goal met)   COGNITION: Overall cognitive status: Within functional limits for tasks assessed     SENSATION: Pt states that she sometimes has some numbness and tingling in her right left arm  MUSCLE LENGTH: Hamstrings: WFL  POSTURE: rounded shoulders  PALPATION: Tightness/muscle spasms noted on bilateral lumbar paraspinals, especially right  LUMBAR ROM:   Eval:  Limited with right lumbar rotation with increased pain.  LOWER EXTREMITY ROM:     WFL  LOWER EXTREMITY MMT:    Eval:  Right hip strength grossly 4/5 Left hip strength grossly 4+/5 Bilat quads WFL Bilat hamstrings 4+/5  11/29/2022: Bilateral hip strength grossly 4 to 4+/5 throughout  LUMBAR SPECIAL TESTS:  Eval:  Slump test: Positive on right  FUNCTIONAL TESTS:  Eval: 5 times sit to stand: 11.2 sec Single leg Stance: right- 1.68 sec, left- 1.45 sec   11/15/2022: 6 Minute Walk Test: 1,290 ft  11/24/2022: 6 Minute Walk Test: 1,325 ft with RPE of 2-3/10  11/29/2022: 5 times sit to stand: 10.76 sec Single leg Stance: right- 13.64 sec, left- 8.88 sec   GAIT: Distance walked: > 500 ft Assistive device utilized: None Level of assistance: Complete Independence Comments: Pt with antalgic gait, but states that she has had more pain since her fall last night  TODAY'S TREATMENT:                                                                                                                               DATE:  12/06/2022 Nustep  level 5 x7 min with PT present to discuss status Seated hamstring stretch 2x20 sec bilat Sit to/from stand holding 6# dumbbell:  x10 with chest press, x10 with overhead press Seated modified deadlift with 6# dumbbell 2x10 Seated core series with 6# dumbbell:  hip to hip, hip to alt shoulder.  X10 each Standing shoulder rows and extension with red tband 2x10 each Standing shoulder ER with red tband 2x10 Standing shoulder horizontal abduction with red tband 2x10 FWD step ups to 6" step x10 bilat Standing alt LE 2 toe tap to dots on 6" step with SBA 2x10 Seated side-bending over peanut ball x15 bilat   12/01/2022 Nustep level 5 x6 min with PT present to discuss status Sit to/from stand holding 5# kettlebell:  x10 with chest press, x10 with overhead press Standing alt LE 2 cone tap with SBA 2x10 Seated hamstring stretch 2x20 sec bilat Seated side-bending over peanut ball x15 bilat Seated modified deadlift with 6# dumbbell 2x10 Standing on foam pad in parallel bars:  hip abduction and hip extension.  2X10 bilat each Tandem gait in parallel bars down and back x2 laps    11/29/2022 Nustep level 5 x6 min with PT present to discuss status 5 times sit to stand and single leg stance Standing alt LE 2 cone tap with CGA 2x10 Sit to/from stand holding 5# kettlebell:  x10 with chest press, x10 with overhead press Seated side-bending over peanut ball x15 bilat Trigger Point Dry-Needling  Treatment instructions: Expect mild to moderate muscle soreness. S/S of pneumothorax if dry needled over a lung field, and to seek immediate medical attention should they occur. Patient verbalized understanding of these instructions and education. Patient Consent Given: Yes Education handout provided: Yes Muscles treated: right side lumbar multifidi Electrical stimulation performed: No Parameters: N/A Treatment  response/outcome: Utilized skilled palpation to identify bony landmarks and trigger points.  Able to illicit twitch response and muscle elongation.  Soft tissue mobilization following to further promote tissue elongation to bilateral lumbar paraspinals.      PATIENT EDUCATION:  Education details: HEP update Person educated: Patient Education method: Explanation, Demonstration, and Handouts Education comprehension: verbalized understanding  HOME EXERCISE PROGRAM: Access Code: [email or web address] URL: [email or web address] Date: 11/15/2022 Prepared by: Raynelle Fanning  Exercises - Standing Single Leg Stance with Counter Support  - 1 x daily - 7 x weekly - 2 reps - 20 sec hold - Standing Tandem Balance with Counter Support  - 1 x daily - 7 x weekly - 2 reps - 20 sec hold - Standing March with Counter Support  - 1 x daily - 7 x weekly - 2 sets - 10 reps - Standing Hip Abduction with Counter Support  - 1 x daily - 7 x weekly - 2 sets - 10 reps - Standing Hip Extension with Counter Support  - 1 x daily - 7 x weekly - 2 sets - 10 reps - Standing Bilateral Low Shoulder Row with Anchored Resistance  - 1 x daily - 3 x weekly - 2 sets - 10 reps - Standing Shoulder Horizontal Abduction with Resistance  - 1 x daily - 3 x weekly - 2 sets - 10 reps - Shoulder extension with resistance - Neutral  - 1 x daily - 3 x weekly - 2 sets - 10 reps - Prone Hip Extension with Pillow Under Abdomen  - 1 x daily - 3-4 x weekly - 2-3 sets - 10 reps - Seated Piriformis Stretch with Trunk Bend  - 2 x daily -  7 x weekly - 1 sets - 3 reps - 30-60 sec  hold  Patient Education - Trigger Point Dry Needling   ASSESSMENT:  CLINICAL IMPRESSION: Ms Seckinger presents to skilled PT reporting overall decreased pain.  Patient able to progress through session with increased performance of exercises with upper body to address forward flexed posture.  Patient reported upper body fatigue with exercises.  Patient continues to progress  with LE strengthening and core stability.  Patient continues to report feeling a good stretch with lateral side-bend over peanut ball.  Patient to have water aerobics class tomorrow.  OBJECTIVE IMPAIRMENTS: decreased balance, decreased ROM, decreased strength, increased muscle spasms, postural dysfunction, and pain.   ACTIVITY LIMITATIONS: carrying, lifting, bending, sleeping, stairs, and reach over head  PARTICIPATION LIMITATIONS: cleaning, shopping, community activity, and church  PERSONAL FACTORS: Time since onset of injury/illness/exacerbation and 1-2 comorbidities: Osteopenia, Lumbar Compression Fx at L4  are also affecting patient's functional outcome.   REHAB POTENTIAL: Good  CLINICAL DECISION MAKING: Stable/uncomplicated  EVALUATION COMPLEXITY: Low   GOALS: Goals reviewed with patient? Yes  SHORT TERM GOALS: Target date: 11/12/2022  Pt will be independent with initial HEP. Baseline: Goal status: MET  2.  Pt will report at least a 30% improvement in pain/symptoms since initial evaluation. Baseline:  Goal status: MET    LONG TERM GOALS: Target date: 12/17/2022  Pt will be independent with advanced HEP to allow for self-progression. Baseline:  Goal status: Ongoing  2.  Pt will increase FOTO to at least 67 to demonstrate improvements in functional mobility. Baseline:  Goal status: MET on 11/24/2022  3.  Pt will increase right hip strength to at least 4+/5 to allow her for perform community tasks with increased ease. Baseline:  Goal status: Ongoing  4.  Pt will report ability to walk her dog for greater than 15-20 minutes without any increased pain or loss of balance. Baseline:  Goal status: Ongoing  5.  Pt will increase single leg stance time at greater than 10 sec on either leg to demonstrate decreased risk of falling. Baseline:  Goal status: Ongoing (see above)    PLAN:  PT FREQUENCY: 2x/week  PT DURATION: 8 weeks  PLANNED INTERVENTIONS: Therapeutic  exercises, Therapeutic activity, Neuromuscular re-education, Balance training, Gait training, Patient/Family education, Self Care, Joint mobilization, Joint manipulation, Stair training, Aquatic Therapy, Dry Needling, Electrical stimulation, Spinal manipulation, Spinal mobilization, Cryotherapy, Moist heat, Taping, Traction, Ultrasound, Ionotophoresis 4mg /ml Dexamethasone, Manual therapy, and Re-evaluation.  PLAN FOR NEXT SESSION: dry needling/manual therapy as indicated, Progress HEP as indicated, strengthening, flexibility, core stability   Reather Laurence, PT, DPT 12/06/22, 1:22 PM  [facility] [address], [address] [place], [place] [address] Phone # [phone] Fax [phone]

## 2022-12-08 ENCOUNTER — Ambulatory Visit: Payer: Medicare Other | Admitting: Rehabilitative and Restorative Service Providers"

## 2022-12-08 ENCOUNTER — Encounter: Payer: Self-pay | Admitting: Rehabilitative and Restorative Service Providers"

## 2022-12-08 DIAGNOSIS — R2689 Other abnormalities of gait and mobility: Secondary | ICD-10-CM

## 2022-12-08 DIAGNOSIS — R252 Cramp and spasm: Secondary | ICD-10-CM | POA: Diagnosis not present

## 2022-12-08 DIAGNOSIS — M5459 Other low back pain: Secondary | ICD-10-CM | POA: Diagnosis not present

## 2022-12-08 DIAGNOSIS — M6281 Muscle weakness (generalized): Secondary | ICD-10-CM | POA: Diagnosis not present

## 2022-12-08 DIAGNOSIS — R262 Difficulty in walking, not elsewhere classified: Secondary | ICD-10-CM | POA: Diagnosis not present

## 2022-12-08 NOTE — Therapy (Signed)
OUTPATIENT PHYSICAL THERAPY TREATMENT NOTE   Patient Name: Barbara Maxwell MRN: [id number] DOB:[birth year], 87 y.o., female Today's Date: 12/08/2022    END OF SESSION:  PT End of Session - 12/08/22 1149     Visit Number 13    Date for PT Re-Evaluation 12/17/22    Authorization Type Medicare    Progress Note Due on Visit 20    PT Start Time 1145    PT Stop Time 1225    PT Time Calculation (min) 40 min    Activity Tolerance Patient tolerated treatment well    Behavior During Therapy WFL for tasks assessed/performed                  Past Medical History:  Diagnosis Date   Anemia    B12 deficiency    Cancer (HCC)    [facility]-  MELANOMA REMOVED FROM BACK    Coronary artery disease    Depression    Hypercholesterolemia    Hypertension    Past Surgical History:  Procedure Laterality Date   ABDOMINAL HYSTERECTOMY     VAGINAL HYSTERECTOMY WITH OVARIAN PRESERVATION    ABDOMINOPLASTY  1998   APPENDECTOMY     BREAST SURGERY  1985   BREAST REDUCTION    CARDIAC CATHETERIZATION  11/19/2019   CORONARY BALLOON ANGIOPLASTY N/A 11/19/2019   Procedure: CORONARY BALLOON ANGIOPLASTY;  Surgeon: Runell Gess, MD;  Location: [facility];  Service: Cardiovascular;  Laterality: N/A;   LEFT HEART CATH AND CORONARY ANGIOGRAPHY N/A 11/19/2019   Procedure: LEFT HEART CATH AND CORONARY ANGIOGRAPHY;  Surgeon: Runell Gess, MD;  Location: [facility];  Service: Cardiovascular;  Laterality: N/A;   TONSILLECTOMY AND ADENOIDECTOMY     Patient Active Problem List   Diagnosis Date Noted   Lumbar compression fracture (HCC) 08/31/2022   Chronic low back pain without sciatica 11/07/2020   Antiplatelet or antithrombotic long-term use 06/13/2020   Iron deficiency anemia 06/02/2020   CAD (coronary artery disease) 11/19/2019   Atypical chest pain 09/28/2019   Dyspnea on exertion 09/28/2019   H/O vitamin D deficiency 01/21/2015   Osteopenia 01/21/2015   Vaginal  atrophy 01/21/2015   Mixed hyperlipidemia 02/04/2014   Family history of ovarian cancer 11/13/2012   Rectocele 11/07/2012   Hyperlipidemia 09/29/2012   Essential hypertension 08/07/2009   ALLERGIC RHINITIS 08/07/2009   HYPERSOMNIA WITH SLEEP APNEA UNSPECIFIED 08/04/2009    PCP: [name], FNP  REFERRING PROVIDER: [name], FNP  REFERRING DIAG: [phone] (ICD-10-CM) - Chronic low back pain without sciatica, unspecified back pain laterality  Rationale for Evaluation and Treatment: Rehabilitation  THERAPY DIAG:  Other low back pain  Cramp and spasm  Muscle weakness (generalized)  Other abnormalities of gait and mobility  ONSET DATE: a month ago  SUBJECTIVE:  SUBJECTIVE STATEMENT: Pt reports that she is ready for discharge next week.  Still occasionally has pain in the one spot on her low back.  PERTINENT HISTORY:  Name pronounced [name], HTN, B12 Deficiency  PAIN:  Are you having pain? Yes: NPRS scale: currently 0/10 Pain location: right sided low back Pain description: burning, throbbing, aching Aggravating factors: too much activity Relieving factors: medication, rest  PRECAUTIONS: Fall  RED FLAGS: Compression fracture: Yes: L4     FALLS:  Has patient fallen in last 6 months? Yes. Number of falls 1 fall last night  LIVING ENVIRONMENT: Lives with: lives alone in Independent Living Lives in: House/apartment Stairs: No Has following equipment at home: Single point cane, Walker - 4 wheeled, shower chair, bed side commode, and Grab bars  OCCUPATION: Retired  PLOF: Independent and Leisure: walking dog, water aerobics 2x/week, going out to dinner with friends, going to church and church activities  PATIENT GOALS: To decrease pain and be  able to do desired activities  NEXT MD VISIT: Dr [name] on Feb 14, 2023  OBJECTIVE:   DIAGNOSTIC FINDINGS:  Lumbar MRI on 07/25/2022: IMPRESSION: 1. Acute or subacute L4 compression fracture with 35% height loss. 2. Diffuse lumbar disc and facet degeneration without spinal stenosis. 3. Moderate neural foraminal stenosis at L3-4 and L5-S1.  PATIENT SURVEYS:  Eval:  FOTO 57 (projected 35 by visit 10) 11/24/2022:  FOTO 67 (goal met)   COGNITION: Overall cognitive status: Within functional limits for tasks assessed     SENSATION: Pt states that she sometimes has some numbness and tingling in her right left arm  MUSCLE LENGTH: Hamstrings: WFL  POSTURE: rounded shoulders  PALPATION: Tightness/muscle spasms noted on bilateral lumbar paraspinals, especially right  LUMBAR ROM:   Eval:  Limited with right lumbar rotation with increased pain.  LOWER EXTREMITY ROM:     WFL  LOWER EXTREMITY MMT:    Eval:  Right hip strength grossly 4/5 Left hip strength grossly 4+/5 Bilat quads WFL Bilat hamstrings 4+/5  11/29/2022: Bilateral hip strength grossly 4 to 4+/5 throughout  LUMBAR SPECIAL TESTS:  Eval:  Slump test: Positive on right  FUNCTIONAL TESTS:  Eval: 5 times sit to stand: 11.2 sec Single leg Stance: right- 1.68 sec, left- 1.45 sec   11/15/2022: 6 Minute Walk Test: 1,290 ft  11/24/2022: 6 Minute Walk Test: 1,325 ft with RPE of 2-3/10  11/29/2022: 5 times sit to stand: 10.76 sec Single leg Stance: right- 13.64 sec, left- 8.88 sec   GAIT: Distance walked: > 500 ft Assistive device utilized: None Level of assistance: Complete Independence Comments: Pt with antalgic gait, but states that she has had more pain since her fall last night  TODAY'S TREATMENT:                                                                                                                              DATE:  12/08/2022 Nustep level 5 x7 min with  PT present to discuss status Sit to/from  stand holding 6# dumbbell:  x10 with chest press, x10 with overhead press Seated core series with 6# dumbbell:  hip to hip, hip to alt shoulder.  X10 each Seated modified deadlift with 6# dumbbell 2x10 Seated hamstring stretch 2x20 sec bilat Standing alt LE 2 cone tap with SBA 2x10 Seated side-bending over peanut ball x15 bilat Standing tandem stance 2x30 sec bilat Single leg stance in parallel bars 2x20 sec bilat with UE support, as needed   12/06/2022 Nustep level 5 x7 min with PT present to discuss status Seated hamstring stretch 2x20 sec bilat Sit to/from stand holding 6# dumbbell:  x10 with chest press, x10 with overhead press Seated modified deadlift with 6# dumbbell 2x10 Seated core series with 6# dumbbell:  hip to hip, hip to alt shoulder.  X10 each Standing shoulder rows and extension with red tband 2x10 each Standing shoulder ER with red tband 2x10 Standing shoulder horizontal abduction with red tband 2x10 FWD step ups to 6" step x10 bilat Standing alt LE 2 toe tap to dots on 6" step with SBA 2x10 Seated side-bending over peanut ball x15 bilat   12/01/2022 Nustep level 5 x6 min with PT present to discuss status Sit to/from stand holding 5# kettlebell:  x10 with chest press, x10 with overhead press Standing alt LE 2 cone tap with SBA 2x10 Seated hamstring stretch 2x20 sec bilat Seated side-bending over peanut ball x15 bilat Seated modified deadlift with 6# dumbbell 2x10 Standing on foam pad in parallel bars:  hip abduction and hip extension.  2X10 bilat each Tandem gait in parallel bars down and back x2 laps      PATIENT EDUCATION:  Education details: HEP update Person educated: Patient Education method: Explanation, Demonstration, and Handouts Education comprehension: verbalized understanding  HOME EXERCISE PROGRAM: Access Code: [email or web address] URL: [email or web address] Date: 11/15/2022 Prepared by: Raynelle Fanning  Exercises - Standing Single Leg Stance with  Counter Support  - 1 x daily - 7 x weekly - 2 reps - 20 sec hold - Standing Tandem Balance with Counter Support  - 1 x daily - 7 x weekly - 2 reps - 20 sec hold - Standing March with Counter Support  - 1 x daily - 7 x weekly - 2 sets - 10 reps - Standing Hip Abduction with Counter Support  - 1 x daily - 7 x weekly - 2 sets - 10 reps - Standing Hip Extension with Counter Support  - 1 x daily - 7 x weekly - 2 sets - 10 reps - Standing Bilateral Low Shoulder Row with Anchored Resistance  - 1 x daily - 3 x weekly - 2 sets - 10 reps - Standing Shoulder Horizontal Abduction with Resistance  - 1 x daily - 3 x weekly - 2 sets - 10 reps - Shoulder extension with resistance - Neutral  - 1 x daily - 3 x weekly - 2 sets - 10 reps - Prone Hip Extension with Pillow Under Abdomen  - 1 x daily - 3-4 x weekly - 2-3 sets - 10 reps - Seated Piriformis Stretch with Trunk Bend  - 2 x daily - 7 x weekly - 1 sets - 3 reps - 30-60 sec  hold  Patient Education - Trigger Point Dry Needling   ASSESSMENT:  CLINICAL IMPRESSION: Ms Reller presents to skilled PT reporting overall improvements and that she has been able to walk her dog.  Patient reports that she has noted, if she does  her stretches before she gets out of bed, she feels better.  Patient continues to progress with increased strengthening and balance.  Patient did not require UE support with tandem stance, but did require UE support with single leg stance.  Patient is on track for discharge from services next week, as scheduled.  OBJECTIVE IMPAIRMENTS: decreased balance, decreased ROM, decreased strength, increased muscle spasms, postural dysfunction, and pain.   ACTIVITY LIMITATIONS: carrying, lifting, bending, sleeping, stairs, and reach over head  PARTICIPATION LIMITATIONS: cleaning, shopping, community activity, and church  PERSONAL FACTORS: Time since onset of injury/illness/exacerbation and 1-2 comorbidities: Osteopenia, Lumbar Compression Fx at L4   are also affecting patient's functional outcome.   REHAB POTENTIAL: Good  CLINICAL DECISION MAKING: Stable/uncomplicated  EVALUATION COMPLEXITY: Low   GOALS: Goals reviewed with patient? Yes  SHORT TERM GOALS: Target date: 11/12/2022  Pt will be independent with initial HEP. Baseline: Goal status: MET  2.  Pt will report at least a 30% improvement in pain/symptoms since initial evaluation. Baseline:  Goal status: MET    LONG TERM GOALS: Target date: 12/17/2022  Pt will be independent with advanced HEP to allow for self-progression. Baseline:  Goal status: Ongoing  2.  Pt will increase FOTO to at least 67 to demonstrate improvements in functional mobility. Baseline:  Goal status: MET on 11/24/2022  3.  Pt will increase right hip strength to at least 4+/5 to allow her for perform community tasks with increased ease. Baseline:  Goal status: Ongoing  4.  Pt will report ability to walk her dog for greater than 15-20 minutes without any increased pain or loss of balance. Baseline:  Goal status: MET on 12/08/2022  5.  Pt will increase single leg stance time at greater than 10 sec on either leg to demonstrate decreased risk of falling. Baseline:  Goal status: Ongoing (see above)    PLAN:  PT FREQUENCY: 2x/week  PT DURATION: 8 weeks  PLANNED INTERVENTIONS: Therapeutic exercises, Therapeutic activity, Neuromuscular re-education, Balance training, Gait training, Patient/Family education, Self Care, Joint mobilization, Joint manipulation, Stair training, Aquatic Therapy, Dry Needling, Electrical stimulation, Spinal manipulation, Spinal mobilization, Cryotherapy, Moist heat, Taping, Traction, Ultrasound, Ionotophoresis 4mg /ml Dexamethasone, Manual therapy, and Re-evaluation.  PLAN FOR NEXT SESSION: dry needling/manual therapy as indicated, Progress HEP as indicated, strengthening, flexibility, core stability   Reather Laurence, PT, DPT 12/08/22, 12:33 PM  [facility]  Special[facility] [address] [place], [place] [address] Phone # [phone] Fax [phone]

## 2022-12-14 ENCOUNTER — Encounter: Payer: Self-pay | Admitting: Rehabilitative and Restorative Service Providers"

## 2022-12-14 ENCOUNTER — Ambulatory Visit: Payer: Medicare Other | Admitting: Rehabilitative and Restorative Service Providers"

## 2022-12-14 DIAGNOSIS — R2689 Other abnormalities of gait and mobility: Secondary | ICD-10-CM

## 2022-12-14 DIAGNOSIS — R252 Cramp and spasm: Secondary | ICD-10-CM

## 2022-12-14 DIAGNOSIS — M6281 Muscle weakness (generalized): Secondary | ICD-10-CM

## 2022-12-14 DIAGNOSIS — M5459 Other low back pain: Secondary | ICD-10-CM

## 2022-12-14 DIAGNOSIS — R262 Difficulty in walking, not elsewhere classified: Secondary | ICD-10-CM | POA: Diagnosis not present

## 2022-12-14 NOTE — Therapy (Signed)
OUTPATIENT PHYSICAL THERAPY TREATMENT NOTE   Patient Name: Barbara Maxwell MRN: [id number] DOB:1935-08-26, 87 y.o., female Today's Date: 12/14/2022    END OF SESSION:  PT End of Session - 12/14/22 1403     Visit Number 14    Date for PT Re-Evaluation 12/17/22    Authorization Type Medicare    Progress Note Due on Visit 20    PT Start Time 1358    PT Stop Time 1436    PT Time Calculation (min) 38 min    Activity Tolerance Patient tolerated treatment well    Behavior During Therapy WFL for tasks assessed/performed                  Past Medical History:  Diagnosis Date   Anemia    B12 deficiency    Cancer (HCC)    [facility]-  MELANOMA REMOVED FROM BACK    Coronary artery disease    Depression    Hypercholesterolemia    Hypertension    Past Surgical History:  Procedure Laterality Date   ABDOMINAL HYSTERECTOMY     VAGINAL HYSTERECTOMY WITH OVARIAN PRESERVATION    ABDOMINOPLASTY  1998   APPENDECTOMY     BREAST SURGERY  1985   BREAST REDUCTION    CARDIAC CATHETERIZATION  11/19/2019   CORONARY BALLOON ANGIOPLASTY N/A 11/19/2019   Procedure: CORONARY BALLOON ANGIOPLASTY;  Surgeon: Runell Gess, MD;  Location: [facility];  Service: Cardiovascular;  Laterality: N/A;   LEFT HEART CATH AND CORONARY ANGIOGRAPHY N/A 11/19/2019   Procedure: LEFT HEART CATH AND CORONARY ANGIOGRAPHY;  Surgeon: Runell Gess, MD;  Location: [facility];  Service: Cardiovascular;  Laterality: N/A;   TONSILLECTOMY AND ADENOIDECTOMY     Patient Active Problem List   Diagnosis Date Noted   Lumbar compression fracture (HCC) 08/31/2022   Chronic low back pain without sciatica 11/07/2020   Antiplatelet or antithrombotic long-term use 06/13/2020   Iron deficiency anemia 06/02/2020   CAD (coronary artery disease) 11/19/2019   Atypical chest pain 09/28/2019   Dyspnea on exertion 09/28/2019   H/O vitamin D deficiency 01/21/2015   Osteopenia 01/21/2015   Vaginal  atrophy 01/21/2015   Mixed hyperlipidemia 02/04/2014   Family history of ovarian cancer 11/13/2012   Rectocele 11/07/2012   Hyperlipidemia 09/29/2012   Essential hypertension 08/07/2009   ALLERGIC RHINITIS 08/07/2009   HYPERSOMNIA WITH SLEEP APNEA UNSPECIFIED 08/04/2009    PCP: [name], FNP  REFERRING PROVIDER: [name], FNP  REFERRING DIAG: [phone] (ICD-10-CM) - Chronic low back pain without sciatica, unspecified back pain laterality  Rationale for Evaluation and Treatment: Rehabilitation  THERAPY DIAG:  Other low back pain  Cramp and spasm  Muscle weakness (generalized)  Other abnormalities of gait and mobility  ONSET DATE: a month ago  SUBJECTIVE:  SUBJECTIVE STATEMENT: Pt reports that she is ready for discharge next week.  Still occasionally has pain in the one spot on her low back.  PERTINENT HISTORY:  Name pronounced [name], HTN, B12 Deficiency  PAIN:  Are you having pain? Yes: NPRS scale: currently 0/10 Pain location: right sided low back Pain description: burning, throbbing, aching Aggravating factors: too much activity Relieving factors: medication, rest  PRECAUTIONS: Fall  RED FLAGS: Compression fracture: Yes: L4     FALLS:  Has patient fallen in last 6 months? Yes. Number of falls 1 fall last night  LIVING ENVIRONMENT: Lives with: lives alone in Independent Living Lives in: House/apartment Stairs: No Has following equipment at home: Single point cane, Walker - 4 wheeled, shower chair, bed side commode, and Grab bars  OCCUPATION: Retired  PLOF: Independent and Leisure: walking dog, water aerobics 2x/week, going out to dinner with friends, going to church and church activities  PATIENT GOALS: To decrease pain and be  able to do desired activities  NEXT MD VISIT: Dr [name] on Feb 14, 2023  OBJECTIVE:   DIAGNOSTIC FINDINGS:  Lumbar MRI on 07/25/2022: IMPRESSION: 1. Acute or subacute L4 compression fracture with 35% height loss. 2. Diffuse lumbar disc and facet degeneration without spinal stenosis. 3. Moderate neural foraminal stenosis at L3-4 and L5-S1.  PATIENT SURVEYS:  Eval:  FOTO 57 (projected 43 by visit 10) 11/24/2022:  FOTO 67 (goal met)   COGNITION: Overall cognitive status: Within functional limits for tasks assessed     SENSATION: Pt states that she sometimes has some numbness and tingling in her right left arm  MUSCLE LENGTH: Hamstrings: WFL  POSTURE: rounded shoulders  PALPATION: Tightness/muscle spasms noted on bilateral lumbar paraspinals, especially right  LUMBAR ROM:   Eval:  Limited with right lumbar rotation with increased pain.  LOWER EXTREMITY ROM:     WFL  LOWER EXTREMITY MMT:    Eval:  Right hip strength grossly 4/5 Left hip strength grossly 4+/5 Bilat quads WFL Bilat hamstrings 4+/5  11/29/2022: Bilateral hip strength grossly 4 to 4+/5 throughout  LUMBAR SPECIAL TESTS:  Eval:  Slump test: Positive on right  FUNCTIONAL TESTS:  Eval: 5 times sit to stand: 11.2 sec Single leg Stance: right- 1.68 sec, left- 1.45 sec   11/15/2022: 6 Minute Walk Test: 1,290 ft  11/24/2022: 6 Minute Walk Test: 1,325 ft with RPE of 2-3/10  11/29/2022: 5 times sit to stand: 10.76 sec Single leg Stance: right- 13.64 sec, left- 8.88 sec   GAIT: Distance walked: > 500 ft Assistive device utilized: None Level of assistance: Complete Independence Comments: Pt with antalgic gait, but states that she has had more pain since her fall last night  TODAY'S TREATMENT:                                                                                                                              DATE:  12/14/2022 Nustep level 5 x6 min with  PT present to discuss status Standing alt  LE 2 cone tap with SBA 2x10 Sit to/from stand holding 6# dumbbell:  x10 with chest press, x10 with overhead press Standing in kickstand position touching cone on PT mat table 2x10 bilat Single leg stance at counter 3x20 sec bilat with UE support, as needed Standing tandem stance 2x30 sec bilat Seated side-bending over peanut ball x15 bilat   12/08/2022 Nustep level 5 x7 min with PT present to discuss status Sit to/from stand holding 6# dumbbell:  x10 with chest press, x10 with overhead press Seated core series with 6# dumbbell:  hip to hip, hip to alt shoulder.  X10 each Seated modified deadlift with 6# dumbbell 2x10 Seated hamstring stretch 2x20 sec bilat Standing alt LE 2 cone tap with SBA 2x10 Seated side-bending over peanut ball x15 bilat Standing tandem stance 2x30 sec bilat Single leg stance in parallel bars 2x20 sec bilat with UE support, as needed   12/06/2022 Nustep level 5 x7 min with PT present to discuss status Seated hamstring stretch 2x20 sec bilat Sit to/from stand holding 6# dumbbell:  x10 with chest press, x10 with overhead press Seated modified deadlift with 6# dumbbell 2x10 Seated core series with 6# dumbbell:  hip to hip, hip to alt shoulder.  X10 each Standing shoulder rows and extension with red tband 2x10 each Standing shoulder ER with red tband 2x10 Standing shoulder horizontal abduction with red tband 2x10 FWD step ups to 6" step x10 bilat Standing alt LE 2 toe tap to dots on 6" step with SBA 2x10 Seated side-bending over peanut ball x15 bilat     PATIENT EDUCATION:  Education details: HEP update Person educated: Patient Education method: Explanation, Demonstration, and Handouts Education comprehension: verbalized understanding  HOME EXERCISE PROGRAM: Access Code: [email or web address] URL: [email or web address] Date: 11/15/2022 Prepared by: Raynelle Fanning  Exercises - Standing Single Leg Stance with Counter Support  - 1 x daily - 7 x weekly - 2 reps  - 20 sec hold - Standing Tandem Balance with Counter Support  - 1 x daily - 7 x weekly - 2 reps - 20 sec hold - Standing March with Counter Support  - 1 x daily - 7 x weekly - 2 sets - 10 reps - Standing Hip Abduction with Counter Support  - 1 x daily - 7 x weekly - 2 sets - 10 reps - Standing Hip Extension with Counter Support  - 1 x daily - 7 x weekly - 2 sets - 10 reps - Standing Bilateral Low Shoulder Row with Anchored Resistance  - 1 x daily - 3 x weekly - 2 sets - 10 reps - Standing Shoulder Horizontal Abduction with Resistance  - 1 x daily - 3 x weekly - 2 sets - 10 reps - Shoulder extension with resistance - Neutral  - 1 x daily - 3 x weekly - 2 sets - 10 reps - Prone Hip Extension with Pillow Under Abdomen  - 1 x daily - 3-4 x weekly - 2-3 sets - 10 reps - Seated Piriformis Stretch with Trunk Bend  - 2 x daily - 7 x weekly - 1 sets - 3 reps - 30-60 sec  hold  Patient Education - Trigger Point Dry Needling   ASSESSMENT:  CLINICAL IMPRESSION: Ms Kable presents to skilled PT reporting only having mild pain/stiffness in the morning, but that after she does her stretches, the pain goes away.  Patient is in agreement with discharge from PT next visit.  Patient encouraged  to continue to perform walk program to continue to progress with activity tolerance.  Patient encouraged to continue to perform balance exercises to reduce fall risk and continue with increased functional strengthening.    OBJECTIVE IMPAIRMENTS: decreased balance, decreased ROM, decreased strength, increased muscle spasms, postural dysfunction, and pain.   ACTIVITY LIMITATIONS: carrying, lifting, bending, sleeping, stairs, and reach over head  PARTICIPATION LIMITATIONS: cleaning, shopping, community activity, and church  PERSONAL FACTORS: Time since onset of injury/illness/exacerbation and 1-2 comorbidities: Osteopenia, Lumbar Compression Fx at L4  are also affecting patient's functional outcome.   REHAB POTENTIAL:  Good  CLINICAL DECISION MAKING: Stable/uncomplicated  EVALUATION COMPLEXITY: Low   GOALS: Goals reviewed with patient? Yes  SHORT TERM GOALS: Target date: 11/12/2022  Pt will be independent with initial HEP. Baseline: Goal status: MET  2.  Pt will report at least a 30% improvement in pain/symptoms since initial evaluation. Baseline:  Goal status: MET    LONG TERM GOALS: Target date: 12/17/2022  Pt will be independent with advanced HEP to allow for self-progression. Baseline:  Goal status: Ongoing  2.  Pt will increase FOTO to at least 67 to demonstrate improvements in functional mobility. Baseline:  Goal status: MET on 11/24/2022  3.  Pt will increase right hip strength to at least 4+/5 to allow her for perform community tasks with increased ease. Baseline:  Goal status: Ongoing  4.  Pt will report ability to walk her dog for greater than 15-20 minutes without any increased pain or loss of balance. Baseline:  Goal status: MET on 12/08/2022  5.  Pt will increase single leg stance time at greater than 10 sec on either leg to demonstrate decreased risk of falling. Baseline:  Goal status: Ongoing (see above)    PLAN:  PT FREQUENCY: 2x/week  PT DURATION: 8 weeks  PLANNED INTERVENTIONS: Therapeutic exercises, Therapeutic activity, Neuromuscular re-education, Balance training, Gait training, Patient/Family education, Self Care, Joint mobilization, Joint manipulation, Stair training, Aquatic Therapy, Dry Needling, Electrical stimulation, Spinal manipulation, Spinal mobilization, Cryotherapy, Moist heat, Taping, Traction, Ultrasound, Ionotophoresis 4mg /ml Dexamethasone, Manual therapy, and Re-evaluation.  PLAN FOR NEXT SESSION: discharge   Reather Laurence, [place], DPT 12/14/22, 3:46 PM  [facility] [address], [address] [place], [place] [address] Phone # [phone] Fax [phone]

## 2022-12-16 ENCOUNTER — Encounter: Payer: Self-pay | Admitting: Rehabilitative and Restorative Service Providers"

## 2022-12-16 ENCOUNTER — Ambulatory Visit: Payer: Medicare Other | Admitting: Rehabilitative and Restorative Service Providers"

## 2022-12-16 DIAGNOSIS — M6281 Muscle weakness (generalized): Secondary | ICD-10-CM | POA: Diagnosis not present

## 2022-12-16 DIAGNOSIS — R252 Cramp and spasm: Secondary | ICD-10-CM | POA: Diagnosis not present

## 2022-12-16 DIAGNOSIS — M5459 Other low back pain: Secondary | ICD-10-CM

## 2022-12-16 DIAGNOSIS — R2689 Other abnormalities of gait and mobility: Secondary | ICD-10-CM

## 2022-12-16 DIAGNOSIS — R262 Difficulty in walking, not elsewhere classified: Secondary | ICD-10-CM

## 2022-12-16 NOTE — Therapy (Signed)
OUTPATIENT PHYSICAL THERAPY TREATMENT NOTE AND DISCHARGE SUMMARY   Patient Name: Barbara Maxwell MRN: [id number] DOB:[birth year], 87 y.o., female Today's Date: 12/16/2022    END OF SESSION:  PT End of Session - 12/16/22 1103     Visit Number 15    Date for PT Re-Evaluation 12/17/22    Authorization Type Medicare    Progress Note Due on Visit 20    PT Start Time 1100    PT Stop Time 1130    PT Time Calculation (min) 30 min    Activity Tolerance Patient tolerated treatment well    Behavior During Therapy WFL for tasks assessed/performed                  Past Medical History:  Diagnosis Date   Anemia    B12 deficiency    Cancer (HCC)    SMALL PLACE-  MELANOMA REMOVED FROM BACK    Coronary artery disease    Depression    Hypercholesterolemia    Hypertension    Past Surgical History:  Procedure Laterality Date   ABDOMINAL HYSTERECTOMY     VAGINAL HYSTERECTOMY WITH OVARIAN PRESERVATION    ABDOMINOPLASTY  1998   APPENDECTOMY     BREAST SURGERY  1985   BREAST REDUCTION    CARDIAC CATHETERIZATION  11/19/2019   CORONARY BALLOON ANGIOPLASTY N/A 11/19/2019   Procedure: CORONARY BALLOON ANGIOPLASTY;  Surgeon: Runell Gess, MD;  Location: [facility];  Service: Cardiovascular;  Laterality: N/A;   LEFT HEART CATH AND CORONARY ANGIOGRAPHY N/A 11/19/2019   Procedure: LEFT HEART CATH AND CORONARY ANGIOGRAPHY;  Surgeon: Runell Gess, MD;  Location: [facility];  Service: Cardiovascular;  Laterality: N/A;   TONSILLECTOMY AND ADENOIDECTOMY     Patient Active Problem List   Diagnosis Date Noted   Lumbar compression fracture (HCC) 08/31/2022   Chronic low back pain without sciatica 11/07/2020   Antiplatelet or antithrombotic long-term use 06/13/2020   Iron deficiency anemia 06/02/2020   CAD (coronary artery disease) 11/19/2019   Atypical chest pain 09/28/2019   Dyspnea on exertion 09/28/2019   H/O vitamin D deficiency 01/21/2015   Osteopenia  01/21/2015   Vaginal atrophy 01/21/2015   Mixed hyperlipidemia 02/04/2014   Family history of ovarian cancer 11/13/2012   Rectocele 11/07/2012   Hyperlipidemia 09/29/2012   Essential hypertension 08/07/2009   ALLERGIC RHINITIS 08/07/2009   HYPERSOMNIA WITH SLEEP APNEA UNSPECIFIED 08/04/2009    PCP: [name], FNP  REFERRING PROVIDER: [name], FNP  REFERRING DIAG: [phone] (ICD-10-CM) - Chronic low back pain without sciatica, unspecified back pain laterality  Rationale for Evaluation and Treatment: Rehabilitation  THERAPY DIAG:  Other low back pain  Cramp and spasm  Muscle weakness (generalized)  Other abnormalities of gait and mobility  Difficulty in walking, not elsewhere classified  ONSET DATE: a month ago  SUBJECTIVE:  SUBJECTIVE STATEMENT: Pt denies current pain, just occasional has some muscle fatigue/soreness.  PERTINENT HISTORY:  Name pronounced [name], HTN, B12 Deficiency  PAIN:  Are you having pain? No  PRECAUTIONS: Fall  RED FLAGS: Compression fracture: Yes: L4     FALLS:  Has patient fallen in last 6 months? Yes. Number of falls 1 fall last night  LIVING ENVIRONMENT: Lives with: lives alone in Independent Living Lives in: House/apartment Stairs: No Has following equipment at home: Single point cane, Walker - 4 wheeled, shower chair, bed side commode, and Grab bars  OCCUPATION: Retired  PLOF: Independent and Leisure: walking dog, water aerobics 2x/week, going out to dinner with friends, going to church and church activities  PATIENT GOALS: To decrease pain and be able to do desired activities  NEXT MD VISIT: Dr [name] on Feb 14, 2023  OBJECTIVE:   DIAGNOSTIC FINDINGS:  Lumbar MRI on 07/25/2022: IMPRESSION: 1.  Acute or subacute L4 compression fracture with 35% height loss. 2. Diffuse lumbar disc and facet degeneration without spinal stenosis. 3. Moderate neural foraminal stenosis at L3-4 and L5-S1.  PATIENT SURVEYS:  Eval:  FOTO 57 (projected 43 by visit 10) 11/24/2022:  FOTO 67 (goal met)   COGNITION: Overall cognitive status: Within functional limits for tasks assessed     SENSATION: Pt states that she sometimes has some numbness and tingling in her right left arm  MUSCLE LENGTH: Hamstrings: WFL  POSTURE: rounded shoulders  PALPATION: Tightness/muscle spasms noted on bilateral lumbar paraspinals, especially right  LUMBAR ROM:   Eval:  Limited with right lumbar rotation with increased pain.  LOWER EXTREMITY ROM:     WFL  LOWER EXTREMITY MMT:    Eval:  Right hip strength grossly 4/5 Left hip strength grossly 4+/5 Bilat quads WFL Bilat hamstrings 4+/5  11/29/2022: Bilateral hip strength grossly 4 to 4+/5 throughout  12/16/2022: Bilateral LE strength is WFL  LUMBAR SPECIAL TESTS:  Eval:  Slump test: Positive on right  FUNCTIONAL TESTS:  Eval: 5 times sit to stand: 11.2 sec Single leg Stance: right- 1.68 sec, left- 1.45 sec   11/15/2022: 6 Minute Walk Test: 1,290 ft  11/24/2022: 6 Minute Walk Test: 1,325 ft with RPE of 2-3/10  11/29/2022: 5 times sit to stand: 10.76 sec Single leg Stance: right- 13.64 sec, left- 8.88 sec   GAIT: Distance walked: > 500 ft Assistive device utilized: None Level of assistance: Complete Independence Comments: Pt with antalgic gait, but states that she has had more pain since her fall last night  TODAY'S TREATMENT:                                                                                                                              DATE:  12/16/2022 Nustep level 5 x6 min with PT present to discuss status Sit to/from stand holding 6# dumbbell:  x10 with chest press, x10 with overhead press Seated core series with 6#  dumbbell:  hip to  hip X20 each Standing alt LE 2 cone tap with SBA 2x10 Seated hamstring stretch 2x20 sec bilat Seated side-bending over peanut ball x15 bilat Single leg stance at barre (to tolerance) Standing tandem stance 2x30 sec bilat   12/14/2022 Nustep level 5 x6 min with PT present to discuss status Standing alt LE 2 cone tap with SBA 2x10 Sit to/from stand holding 6# dumbbell:  x10 with chest press, x10 with overhead press Standing in kickstand position touching cone on PT mat table 2x10 bilat Single leg stance at counter 3x20 sec bilat with UE support, as needed Standing tandem stance 2x30 sec bilat Seated side-bending over peanut ball x15 bilat   12/08/2022 Nustep level 5 x7 min with PT present to discuss status Sit to/from stand holding 6# dumbbell:  x10 with chest press, x10 with overhead press Seated core series with 6# dumbbell:  hip to hip, hip to alt shoulder.  X10 each Seated modified deadlift with 6# dumbbell 2x10 Seated hamstring stretch 2x20 sec bilat Standing alt LE 2 cone tap with SBA 2x10 Seated side-bending over peanut ball x15 bilat Standing tandem stance 2x30 sec bilat Single leg stance in parallel bars 2x20 sec bilat with UE support, as needed     PATIENT EDUCATION:  Education details: HEP update Person educated: Patient Education method: Explanation, Demonstration, and Handouts Education comprehension: verbalized understanding  HOME EXERCISE PROGRAM: Access Code: [email or web address] URL: [email or web address] Date: 11/15/2022 Prepared by: Raynelle Fanning  Exercises - Standing Single Leg Stance with Counter Support  - 1 x daily - 7 x weekly - 2 reps - 20 sec hold - Standing Tandem Balance with Counter Support  - 1 x daily - 7 x weekly - 2 reps - 20 sec hold - Standing March with Counter Support  - 1 x daily - 7 x weekly - 2 sets - 10 reps - Standing Hip Abduction with Counter Support  - 1 x daily - 7 x weekly - 2 sets - 10 reps - Standing Hip  Extension with Counter Support  - 1 x daily - 7 x weekly - 2 sets - 10 reps - Standing Bilateral Low Shoulder Row with Anchored Resistance  - 1 x daily - 3 x weekly - 2 sets - 10 reps - Standing Shoulder Horizontal Abduction with Resistance  - 1 x daily - 3 x weekly - 2 sets - 10 reps - Shoulder extension with resistance - Neutral  - 1 x daily - 3 x weekly - 2 sets - 10 reps - Prone Hip Extension with Pillow Under Abdomen  - 1 x daily - 3-4 x weekly - 2-3 sets - 10 reps - Seated Piriformis Stretch with Trunk Bend  - 2 x daily - 7 x weekly - 1 sets - 3 reps - 30-60 sec  hold  Patient Education - Trigger Point Dry Needling   ASSESSMENT:  CLINICAL IMPRESSION: Ms Santoso presents to skilled PT reporting that she feels ready to discharge from services today.  Patient has made great progress and has met all goals except single leg stance on the left side (which she has nearly met).  Patient with improved LE strength and is able to perform all desired tasks.  Patient is able to walk her dog and has begun a walking program in her building to increase her daily step count.  Patient is discharged from skilled PT at this time to continue with HEP.  OBJECTIVE IMPAIRMENTS: decreased balance, decreased ROM, decreased strength, increased muscle spasms, postural dysfunction, and  pain.   ACTIVITY LIMITATIONS: carrying, lifting, bending, sleeping, stairs, and reach over head  PARTICIPATION LIMITATIONS: cleaning, shopping, community activity, and church  PERSONAL FACTORS: Time since onset of injury/illness/exacerbation and 1-2 comorbidities: Osteopenia, Lumbar Compression Fx at L4  are also affecting patient's functional outcome.   REHAB POTENTIAL: Good  CLINICAL DECISION MAKING: Stable/uncomplicated  EVALUATION COMPLEXITY: Low   GOALS: Goals reviewed with patient? Yes  SHORT TERM GOALS: Target date: 11/12/2022  Pt will be independent with initial HEP. Baseline: Goal status: MET  2.  Pt will  report at least a 30% improvement in pain/symptoms since initial evaluation. Baseline:  Goal status: MET    LONG TERM GOALS: Target date: 12/17/2022  Pt will be independent with advanced HEP to allow for self-progression. Baseline:  Goal status: MET  2.  Pt will increase FOTO to at least 67 to demonstrate improvements in functional mobility. Baseline:  Goal status: MET on 11/24/2022  3.  Pt will increase right hip strength to at least 4+/5 to allow her for perform community tasks with increased ease. Baseline:  Goal status: MET  4.  Pt will report ability to walk her dog for greater than 15-20 minutes without any increased pain or loss of balance. Baseline:  Goal status: MET on 12/08/2022  5.  Pt will increase single leg stance time at greater than 10 sec on either leg to demonstrate decreased risk of falling. Baseline:  Goal status: Met on right side, Partially Met on left side    PLAN:  PT FREQUENCY: 2x/week  PT DURATION: 8 weeks  PLANNED INTERVENTIONS: Therapeutic exercises, Therapeutic activity, Neuromuscular re-education, Balance training, Gait training, Patient/Family education, Self Care, Joint mobilization, Joint manipulation, Stair training, Aquatic Therapy, Dry Needling, Electrical stimulation, Spinal manipulation, Spinal mobilization, Cryotherapy, Moist heat, Taping, Traction, Ultrasound, Ionotophoresis 4mg /ml Dexamethasone, Manual therapy, and Re-evaluation.    PHYSICAL THERAPY DISCHARGE SUMMARY  Patient agrees to discharge. Patient goals were  met except single leg stance time (which was met on the right side, partially met on left) . Patient is being discharged due to being pleased with the current functional level.    Reather Laurence, PT, DPT 12/16/22, 11:41 AM  [facility] [address] [place], [place] [address] Phone # [phone] Fax [phone]

## 2023-01-21 ENCOUNTER — Other Ambulatory Visit: Payer: Self-pay | Admitting: Family

## 2023-02-07 ENCOUNTER — Encounter: Payer: Self-pay | Admitting: Cardiovascular Disease

## 2023-02-07 ENCOUNTER — Ambulatory Visit: Payer: Medicare Other | Attending: Cardiovascular Disease | Admitting: Cardiovascular Disease

## 2023-02-07 VITALS — BP 116/60 | HR 66 | Ht 63.0 in | Wt 144.0 lb

## 2023-02-07 DIAGNOSIS — I2510 Atherosclerotic heart disease of native coronary artery without angina pectoris: Secondary | ICD-10-CM | POA: Insufficient documentation

## 2023-02-07 DIAGNOSIS — I10 Essential (primary) hypertension: Secondary | ICD-10-CM | POA: Insufficient documentation

## 2023-02-07 DIAGNOSIS — E782 Mixed hyperlipidemia: Secondary | ICD-10-CM | POA: Diagnosis not present

## 2023-02-07 DIAGNOSIS — R0789 Other chest pain: Secondary | ICD-10-CM | POA: Insufficient documentation

## 2023-02-07 NOTE — Assessment & Plan Note (Signed)
History of essential hypertension her blood pressure measured today at 116/60.  She is on low-dose amlodipine, and metoprolol.

## 2023-02-07 NOTE — Assessment & Plan Note (Signed)
History of CAD status post coronary calcium score performed 10/15/2019 which was 1681.  Cardiac cath performed 11/19/2019 revealed a 90% proximal LAD lesion just after the first diagonal branch which I did not think appear physiologically significant and a 99% calcified ostial RCA stenosis with grade 2-3 left-to-right collaterals.  I placed a temporary transvenous pacemaker via the right common femoral artery and attempted orbital atherectomy and stenting however I was unable to confirm that I was intraluminal and aborted the procedure.  She has had minimal symptoms since.

## 2023-02-07 NOTE — Assessment & Plan Note (Signed)
History of hyperlipidemia on high-dose rosuvastatin and Zetia with lipid profile performed 08/27/2022 revealing total cholesterol 156, LDL 85 and HDL 50.

## 2023-02-07 NOTE — Progress Notes (Signed)
02/07/2023 Barbara Maxwell   [birth year]  [id number]  Primary Physician Barbara Bass, FNP Primary Cardiologist: Barbara Gess MD Barbara Maxwell, [place]  HPI:  Barbara Maxwell is a 87 y.o.    mildly overweight widowed Caucasian female (husband of 63 years recently passed away), mother of 3 living children (daughter 5 committed suicide), grandmother 6 grandchildren referred by Dr. Dayton Maxwell for cardiovascular valuation because of atypical chest pain and dyspnea.   I last saw her in the office 03/04/2021. Risk factors are only notable for hyperlipidemia treated.  Her father apparently did have bypass surgery and she had a son who had a stent.  She is never had a heart attack or stroke.  She is fairly active and does water aerobics 2 days/week.  She is noticed some dyspnea walking up an incline recently and some atypical chest pain.  She does admit to anxiety since she is living alone currently.   I obtained a 2D echocardiogram on 10/15/2019 which was essentially normal with grade 1 diastolic dysfunction. A coronary calcium score was measured at 1681 with suggestion of proximal to mid RCA disease and LAD disease.   Based on this I performed outpatient diagnostic coronary angiography on her 11/19/2019 with a right radial approach revealing a 60% proximal LAD lesion just after the first diagonal branch which I did not think appeared physiologically significant and a 99% calcified ostial RCA with grade 2-3 left-to-right collaterals.  I placed a temporary transvenous pacemaker via the right common femoral vein and did attempt orbital atherectomy PCI and stenting however I was able to cross with a wire but was unable to confirm that I was intraluminal and therefore aborted the procedure.  My plan was to treat her medically initially with intentional medications were history of intervention for recalcitrant symptoms   She  was seen in the ER after developing left arm pain during a church  service and taking sublingual nitroglycerin.  She became presyncopal.  She was evaluated and found to be anemic with a hemoglobin of 7.7 for unclear reasons and was transfused 1 unit of packed red blood cells resulting in an increase in her hemoglobin to 9.1.  She is on oral iron replacement and is scheduled to have colonoscopy.  When I initially saw her she was having significant dyspnea walking up a hill but this has become less of an issue.  Since I saw her in the office 1 year ago she is remained stable.  She ultimately moved to an assisted care facility and now intends to move down to [place] with her daughter in the near future.  Unfortunately she had to put her dog down.  Current Meds  Medication Sig   acetaminophen (TYLENOL) 325 MG tablet Take 325 mg by mouth every 6 (six) hours as needed for moderate pain.    amLODipine (NORVASC) 2.5 MG tablet TAKE 1 TABLET DAILY   Calcium Carbonate-Vitamin D (CALCIUM + D PO) Take 1 tablet by mouth daily.   Carboxymethylcellulose Sodium (REFRESH TEARS OP) Place 1 drop into both eyes daily.   clopidogrel (PLAVIX) 75 MG tablet TAKE 1 TABLET DAILY WITH BREAKFAST   ezetimibe (ZETIA) 10 MG tablet TAKE 1 TABLET DAILY   fenofibrate 54 MG tablet TAKE 1 TABLET(54 MG) BY MOUTH DAILY   iron polysaccharides (NIFEREX) 150 MG capsule TAKE 1 CAPSULE(150 MG) BY MOUTH DAILY   methocarbamol (ROBAXIN) 500 MG tablet Take 1 tablet (500 mg total) by mouth at bedtime as needed  for muscle spasms.   metoprolol succinate (TOPROL-XL) 25 MG 24 hr tablet TAKE 1 TABLET DAILY   pantoprazole (PROTONIX) 40 MG tablet TAKE 1 TABLET TWICE A DAY BEFORE MEALS   rosuvastatin (CRESTOR) 40 MG tablet TAKE 1 TABLET DAILY   sertraline (ZOLOFT) 50 MG tablet TAKE 1 TABLET DAILY   sodium chloride (OCEAN) 0.65 % SOLN nasal spray Place 1 spray into both nostrils daily.   traMADol (ULTRAM) 50 MG tablet Take 1 tablet (50 mg total) by mouth every 6 (six) hours as needed.     Allergies  Allergen  Reactions   Bee Venom Anaphylaxis   Simvastatin     Myopathy   Codeine Nausea Only   Evista [Raloxifene]     Hot flashes   Percocet [Oxycodone-Acetaminophen] Nausea And Vomiting    Social History   Socioeconomic History   Marital status: Widowed    Spouse name: Not on file   Number of children: 4   Years of education: Not on file   Highest education level: Not on file  Occupational History   Occupation: retired  Tobacco Use   Smoking status: Never   Smokeless tobacco: Never  Vaping Use   Vaping status: Never Used  Substance and Sexual Activity   Alcohol use: No    Alcohol/week: 0.0 standard drinks of alcohol   Drug use: Not on file   Sexual activity: Not Currently    Comment: 1st intercourse- 20, partners- 2, widow  Other Topics Concern   Not on file  Social History Narrative   Not on file   Social Drivers of Health   Financial Resource Strain: Low Risk  (08/27/2022)   Overall Financial Resource Strain (CARDIA)    Difficulty of Paying Living Expenses: Not hard at all  Food Insecurity: No Food Insecurity (08/27/2022)   Hunger Vital Sign    Worried About Running Out of Food in the Last Year: Never true    Ran Out of Food in the Last Year: Never true  Transportation Needs: No Transportation Needs (08/27/2022)   PRAPARE - Administrator, Civil Service (Medical): No    Lack of Transportation (Non-Medical): No  Physical Activity: Insufficiently Active (08/27/2022)   Exercise Vital Sign    Days of Exercise per Week: 2 days    Minutes of Exercise per Session: 60 min  Stress: No Stress Concern Present (08/27/2022)   [employer] of Occupational Health - Occupational Stress Questionnaire    Feeling of Stress : Not at all  Social Connections: Moderately Isolated (05/26/2021)   Social Connection and Isolation Panel [NHANES]    Frequency of Communication with Friends and Family: More than three times a week    Frequency of Social Gatherings with Friends and Family:  Twice a week    Attends Religious Services: More than 4 times per year    Active Member of [employer] or Organizations: No    Attends Banker Meetings: Never    Marital Status: Widowed  Intimate Partner Violence: Not At Risk (08/27/2022)   Humiliation, Afraid, Rape, and Kick questionnaire    Fear of Current or Ex-Partner: No    Emotionally Abused: No    Physically Abused: No    Sexually Abused: No     Review of Systems: General: negative for chills, fever, night sweats or weight changes.  Cardiovascular: negative for chest pain, dyspnea on exertion, edema, orthopnea, palpitations, paroxysmal nocturnal dyspnea or shortness of breath Dermatological: negative for rash Respiratory: negative for cough or  wheezing Urologic: negative for hematuria Abdominal: negative for nausea, vomiting, diarrhea, bright red blood per rectum, melena, or hematemesis Neurologic: negative for visual changes, syncope, or dizziness All other systems reviewed and are otherwise negative except as noted above.    Blood pressure 116/60, pulse 66, height 5\' 3"  (1.6 m), weight 144 lb (65.3 kg).  General appearance: alert and no distress Neck: no adenopathy, no carotid bruit, no JVD, supple, symmetrical, trachea midline, and thyroid not enlarged, symmetric, no tenderness/mass/nodules Lungs: clear to auscultation bilaterally Heart: regular rate and rhythm, S1, S2 normal, no murmur, click, rub or gallop Extremities: extremities normal, atraumatic, no cyanosis or edema Pulses: 2+ and symmetric Skin: Skin color, texture, turgor normal. No rashes or lesions Neurologic: Grossly normal  EKG EKG Interpretation Date/Time:  Monday February 07 2023 15:32:03 EST Ventricular Rate:  66 PR Interval:  154 QRS Duration:  64 QT Interval:  414 QTC Calculation: 434 R Axis:   -16  Text Interpretation: Normal sinus rhythm Possible Inferior infarct , age undetermined Anterior infarct , age undetermined When compared with  ECG of 25-May-2020 13:01, PREVIOUS ECG IS PRESENT Confirmed by Nanetta Batty [phone]) on 02/07/2023 3:45:33 PM    ASSESSMENT AND PLAN:   Essential hypertension History of essential hypertension her blood pressure measured today at 116/60.  She is on low-dose amlodipine, and metoprolol.  Hyperlipidemia History of hyperlipidemia on high-dose rosuvastatin and Zetia with lipid profile performed 08/27/2022 revealing total cholesterol 156, LDL 85 and HDL 50.  CAD (coronary artery disease) History of CAD status post coronary calcium score performed 10/15/2019 which was 1681.  Cardiac cath performed 11/19/2019 revealed a 90% proximal LAD lesion just after the first diagonal branch which I did not think appear physiologically significant and a 99% calcified ostial RCA stenosis with grade 2-3 left-to-right collaterals.  I placed a temporary transvenous pacemaker via the right common femoral artery and attempted orbital atherectomy and stenting however I was unable to confirm that I was intraluminal and aborted the procedure.  She has had minimal symptoms since.     Barbara Gess MD FACP,FACC,FAHA, [facility] 02/07/2023 3:53 PM

## 2023-02-07 NOTE — Patient Instructions (Signed)
Medication Instructions:  Your physician recommends that you continue on your current medications as directed. Please refer to the Current Medication list given to you today.  *If you need a refill on your cardiac medications before your next appointment, please call your pharmacy*   Follow-Up: At [facility], you and your health needs are our priority.  As part of our continuing mission to provide you with exceptional heart care, we have created designated Provider Care Teams.  These Care Teams include your primary Cardiologist (physician) and Advanced Practice Providers (APPs -  Physician Assistants and Nurse Practitioners) who all work together to provide you with the care you need, when you need it.  We recommend signing up for the patient portal called "MyChart".  Sign up information is provided on this After Visit Summary.  MyChart is used to connect with patients for Virtual Visits (Telemedicine).  Patients are able to view lab/test results, encounter notes, upcoming appointments, etc.  Non-urgent messages can be sent to your provider as well.   To learn more about what you can do with MyChart, go to [email or web address].    Your next appointment:   We will see you on an as needed basis.  Provider:   Jonathan Berry, MD  

## 2023-02-14 ENCOUNTER — Ambulatory Visit: Payer: Medicare Other | Admitting: Cardiovascular Disease

## 2023-03-09 DIAGNOSIS — L814 Other melanin hyperpigmentation: Secondary | ICD-10-CM | POA: Diagnosis not present

## 2023-03-09 DIAGNOSIS — D485 Neoplasm of uncertain behavior of skin: Secondary | ICD-10-CM | POA: Diagnosis not present

## 2023-03-09 DIAGNOSIS — D225 Melanocytic nevi of trunk: Secondary | ICD-10-CM | POA: Diagnosis not present

## 2023-03-09 DIAGNOSIS — Z85820 Personal history of malignant melanoma of skin: Secondary | ICD-10-CM | POA: Diagnosis not present

## 2023-03-09 DIAGNOSIS — D1801 Hemangioma of skin and subcutaneous tissue: Secondary | ICD-10-CM | POA: Diagnosis not present

## 2023-03-09 DIAGNOSIS — L821 Other seborrheic keratosis: Secondary | ICD-10-CM | POA: Diagnosis not present

## 2023-03-24 ENCOUNTER — Ambulatory Visit (INDEPENDENT_AMBULATORY_CARE_PROVIDER_SITE_OTHER): Payer: Medicare Other | Admitting: Family

## 2023-03-24 ENCOUNTER — Encounter: Payer: Self-pay | Admitting: Family

## 2023-03-24 VITALS — BP 112/64 | HR 64 | Ht 63.0 in | Wt 143.8 lb

## 2023-03-24 DIAGNOSIS — I10 Essential (primary) hypertension: Secondary | ICD-10-CM

## 2023-03-24 DIAGNOSIS — D509 Iron deficiency anemia, unspecified: Secondary | ICD-10-CM

## 2023-03-24 DIAGNOSIS — G8929 Other chronic pain: Secondary | ICD-10-CM | POA: Diagnosis not present

## 2023-03-24 DIAGNOSIS — M545 Low back pain, unspecified: Secondary | ICD-10-CM

## 2023-03-24 DIAGNOSIS — E782 Mixed hyperlipidemia: Secondary | ICD-10-CM | POA: Diagnosis not present

## 2023-03-24 MED ORDER — METHOCARBAMOL 500 MG PO TABS: 500.0000 mg | ORAL_TABLET | Freq: Every evening | ORAL | 0 refills | Status: AC | PRN

## 2023-03-24 MED ORDER — TRAMADOL HCL 50 MG PO TABS: 50.0000 mg | ORAL_TABLET | Freq: Four times a day (QID) | ORAL | 0 refills | Status: AC | PRN

## 2023-03-24 NOTE — Progress Notes (Signed)
Barbara Maxwell is a 88 y.o. female with the following history as recorded in Epic[employer]:  Patient Active Problem List   Diagnosis Date Noted   Lumbar compression fracture (HCC) 08/31/2022   Chronic low back pain without sciatica 11/07/2020   Antiplatelet or antithrombotic long-term use 06/13/2020   Iron deficiency anemia 06/02/2020   CAD (coronary artery disease) 11/19/2019   Atypical chest pain 09/28/2019   Dyspnea on exertion 09/28/2019   H/O vitamin D deficiency 01/21/2015   Osteopenia 01/21/2015   Vaginal atrophy 01/21/2015   Mixed hyperlipidemia 02/04/2014   Family history of ovarian cancer 11/13/2012   Rectocele 11/07/2012   Hyperlipidemia 09/29/2012   Essential hypertension 08/07/2009   ALLERGIC RHINITIS 08/07/2009   HYPERSOMNIA WITH SLEEP APNEA UNSPECIFIED 08/04/2009    Current Outpatient Medications  Medication Sig Dispense Refill   acetaminophen (TYLENOL) 325 MG tablet Take 325 mg by mouth every 6 (six) hours as needed for moderate pain.      amLODipine (NORVASC) 2.5 MG tablet TAKE 1 TABLET DAILY 90 tablet 3   Calcium Carbonate-Vitamin D (CALCIUM + D PO) Take 1 tablet by mouth daily.     Carboxymethylcellulose Sodium (REFRESH TEARS OP) Place 1 drop into both eyes daily.     clopidogrel (PLAVIX) 75 MG tablet TAKE 1 TABLET DAILY WITH BREAKFAST 90 tablet 3   ezetimibe (ZETIA) 10 MG tablet TAKE 1 TABLET DAILY 90 tablet 3   fenofibrate 54 MG tablet TAKE 1 TABLET(54 MG) BY MOUTH DAILY 90 tablet 3   iron polysaccharides (NIFEREX) 150 MG capsule TAKE 1 CAPSULE(150 MG) BY MOUTH DAILY 90 capsule 2   metoprolol succinate (TOPROL-XL) 25 MG 24 hr tablet TAKE 1 TABLET DAILY 90 tablet 3   pantoprazole (PROTONIX) 40 MG tablet TAKE 1 TABLET TWICE A DAY BEFORE MEALS 180 tablet 1   rosuvastatin (CRESTOR) 40 MG tablet TAKE 1 TABLET DAILY 90 tablet 3   sertraline (ZOLOFT) 50 MG tablet TAKE 1 TABLET DAILY 90 tablet 3   sodium chloride (OCEAN) 0.65 % SOLN nasal spray Place 1 spray into both  nostrils daily.     methocarbamol (ROBAXIN) 500 MG tablet Take 1 tablet (500 mg total) by mouth at bedtime as needed for muscle spasms. 30 tablet 0   nitroGLYCERIN (NITROSTAT) 0.4 MG SL tablet Place 1 tablet (0.4 mg total) under the tongue every 5 (five) minutes as needed for chest pain. 25 tablet 5   traMADol (ULTRAM) 50 MG tablet Take 1 tablet (50 mg total) by mouth every 6 (six) hours as needed. 30 tablet 0   No current facility-administered medications for this visit.    Allergies: Bee venom, Simvastatin, Codeine, Evista [raloxifene], and Percocet [oxycodone-acetaminophen]  Past Medical History:  Diagnosis Date   Anemia    B12 deficiency    Cancer (HCC)    SMALL [facility]ACE-  MELANOMA REMOVED FROM BACK    Coronary artery disease    Depression    Hypercholesterolemia    Hypertension     Past Surgical History:  Procedure Laterality Date   ABDOMINAL HYSTERECTOMY     VAGINAL HYSTERECTOMY WITH OVARIAN PRESERVATION    ABDOMINOPLASTY  1998   APPENDECTOMY     BREAST SURGERY  1985   BREAST REDUCTION    CARDIAC CATHETERIZATION  11/19/2019   CORONARY BALLOON ANGIOPLASTY N/A 11/19/2019   Procedure: CORONARY BALLOON ANGIOPLASTY;  Surgeon: Runell Gess, MD;  Location: [facility];  Service: Cardiovascular;  Laterality: N/A;   LEFT HEART CATH AND CORONARY ANGIOGRAPHY N/A 11/19/2019  Procedure: LEFT HEART CATH AND CORONARY ANGIOGRAPHY;  Surgeon: Runell Gess, MD;  Location: [facility];  Service: Cardiovascular;  Laterality: N/A;   TONSILLECTOMY AND ADENOIDECTOMY      Family History  Problem Relation Age of Onset   Hypertension Mother    Heart disease Father    Diabetes Father    Cancer Sister 15       OVARIAN    Thyroid disease Sister    Heart disease Brother    Diabetes Paternal Grandmother    Thyroid disease Daughter    Colon cancer Neg Hx    Esophageal cancer Neg Hx    Rectal cancer Neg Hx    Stomach cancer Neg Hx     Social History   Tobacco Use    Smoking status: Never   Smokeless tobacco: Never  Substance Use Topics   Alcohol use: No    Alcohol/week: 0.0 standard drinks of alcohol    Subjective:   Patient will be moving to [place] at the end of February to live with her daughter. Very excited about this opportunity; "wanted to just get everything checked out." No acute concerns today;   Objective:  Vitals:   03/24/23 1352  BP: 112/64  Pulse: 64  SpO2: 97%  Weight: 143 lb 12.8 oz (65.2 kg)  Height: 5\' 3"  (1.6 m)    General: Well developed, well nourished, in no acute distress  Skin : Warm and dry.  Head: Normocephalic and atraumatic  Eyes: Sclera and conjunctiva clear; pupils round and reactive to light; extraocular movements intact  Lungs: Respirations unlabored; clear to auscultation bilaterally without wheeze, rales, rhonchi  CVS exam: normal rate and regular rhythm.  Neurologic: Alert and oriented; speech intact; face symmetrical; moves all extremities well; CNII-XII intact without focal deficit   Assessment:  1. Iron deficiency anemia, unspecified iron deficiency anemia type   2. Essential hypertension   3. Mixed hyperlipidemia   4. Chronic low back pain without sciatica, unspecified back pain laterality     Plan:   Will update labs today to ensure stability; Stable; continue same medications; Stable- she saw her cardiologist in July 2024 and will be establishing with new cardiology in [facility]; Stable- refill on Tramadol and Robaxin updated; she will consider doing PT once she is established in new home.   Patient understands to please reach out and let us know anything we can do to help her in the transition of her move. She is a lovely lady and have definitely enjoyed getting to work with her.   No follow-ups on file.  Orders Placed This Encounter  Procedures   CBC with Differential/Platelet   Comp Met (CMET)   IBC + Ferritin    Requested Prescriptions   Signed Prescriptions Disp Refills   traMADol  (ULTRAM) 50 MG tablet 30 tablet 0    Sig: Take 1 tablet (50 mg total) by mouth every 6 (six) hours as needed.   methocarbamol (ROBAXIN) 500 MG tablet 30 tablet 0    Sig: Take 1 tablet (500 mg total) by mouth at bedtime as needed for muscle spasms.

## 2023-03-24 NOTE — Patient Instructions (Signed)
We will miss you but I am so excited for you. Please let us know if you need anything!! Let us hear from you once you get settled;

## 2023-03-25 LAB — COMPREHENSIVE METABOLIC PANEL
ALT: 9 U/L (ref 0–35)
AST: 17 U/L (ref 0–37)
Albumin: 4.4 g/dL (ref 3.5–5.2)
Alkaline Phosphatase: 53 U/L (ref 39–117)
BUN: 22 mg/dL (ref 6–23)
CO2: 28 meq/L (ref 19–32)
Calcium: 9.8 mg/dL (ref 8.4–10.5)
Chloride: 105 meq/L (ref 96–112)
Creatinine, Ser: 1.17 mg/dL (ref 0.40–1.20)
GFR: 42.48 mL/min — ABNORMAL LOW (ref >60.00)
Glucose, Bld: 88 mg/dL (ref 70–99)
Potassium: 4.6 meq/L (ref 3.5–5.1)
Sodium: 142 meq/L (ref 135–145)
Total Bilirubin: 0.6 mg/dL (ref 0.2–1.2)
Total Protein: 6.8 g/dL (ref 6.0–8.3)

## 2023-03-25 LAB — CBC WITH DIFFERENTIAL/PLATELET
Basophils Absolute: 0 10*3/uL (ref 0.0–0.1)
Basophils Relative: 0.7 % (ref 0.0–3.0)
Eosinophils Absolute: 0.3 10*3/uL (ref 0.0–0.7)
Eosinophils Relative: 3.9 % (ref 0.0–5.0)
HCT: 37.2 % (ref 36.0–46.0)
Hemoglobin: 12.3 g/dL (ref 12.0–15.0)
Lymphocytes Relative: 36.8 % (ref 12.0–46.0)
Lymphs Abs: 2.6 10*3/uL (ref 0.7–4.0)
MCHC: 33.1 g/dL (ref 30.0–36.0)
MCV: 85.8 fL (ref 78.0–100.0)
Monocytes Absolute: 0.5 10*3/uL (ref 0.1–1.0)
Monocytes Relative: 7.4 % (ref 3.0–12.0)
Neutro Abs: 3.6 10*3/uL (ref 1.4–7.7)
Neutrophils Relative %: 51.2 % (ref 43.0–77.0)
Platelets: 292 10*3/uL (ref 150.0–400.0)
RBC: 4.34 Mil/uL (ref 3.87–5.11)
RDW: 13.4 % (ref 11.5–15.5)
WBC: 7.1 10*3/uL (ref 4.0–10.5)

## 2023-03-25 LAB — IBC + FERRITIN
Ferritin: 8.6 ng/mL — ABNORMAL LOW (ref 10.0–291.0)
Iron: 65 ug/dL (ref 42–145)
Saturation Ratios: 12.4 % — ABNORMAL LOW (ref 20.0–50.0)
TIBC: 525 ug/dL — ABNORMAL HIGH (ref 250.0–450.0)
Transferrin: 375 mg/dL — ABNORMAL HIGH (ref 212.0–360.0)

## 2023-03-29 ENCOUNTER — Ambulatory Visit: Payer: Medicare Other | Admitting: Cardiovascular Disease

## 2023-03-30 ENCOUNTER — Telehealth: Payer: Self-pay

## 2023-03-30 ENCOUNTER — Other Ambulatory Visit: Payer: Self-pay | Admitting: Family

## 2023-03-30 DIAGNOSIS — D509 Iron deficiency anemia, unspecified: Secondary | ICD-10-CM

## 2023-03-30 NOTE — Telephone Encounter (Signed)
 Copied from CRM [id number]. Topic: Clinical - Lab/Test Results >> Mar 30, 2023  1:57 PM Leotis ORN wrote: Reason for CRM: patient returning missed call from  Harlene CHRISTELLA Foot, CMA regarding her most recent labs, relayed message verbatim and the patient stated that she would like to have the iron  infusion done before she moves, she is leaving on 04/19/23, she is requesting a callback at her mobile [phone]

## 2023-03-31 ENCOUNTER — Telehealth: Payer: Self-pay | Admitting: *Deleted

## 2023-03-31 NOTE — Telephone Encounter (Signed)
 Pt returned my call and was notified of below. Gave pt contact number to hem/onc office to call and check status of referral.  Pt voices understanding and agreeable.

## 2023-03-31 NOTE — Telephone Encounter (Signed)
 E2C2  please do not discuss with pt, transfer her to our office for discussion.  Pt currently on our lab schedule on 2/21 for an iron  infusion.  Infusions are not performed in our lab, only venipunture/blood draws.  Left message for pt to return my call. Appt is being cancelled for lab appt. There is a referral in place for infusion through the [facility] hem/onc dept w/ Dr Gatha and they should be reaching out to her to schedule that.

## 2023-04-04 NOTE — Telephone Encounter (Signed)
 Pt spoke with Kerney Pee and was provided with phone number to follow up on referral.

## 2023-04-06 ENCOUNTER — Telehealth: Payer: Self-pay | Admitting: Internal Medicine

## 2023-04-12 ENCOUNTER — Other Ambulatory Visit: Payer: Self-pay | Admitting: Medical Oncology

## 2023-04-12 DIAGNOSIS — D508 Other iron deficiency anemias: Secondary | ICD-10-CM

## 2023-04-13 ENCOUNTER — Inpatient Hospital Stay (HOSPITAL_BASED_OUTPATIENT_CLINIC_OR_DEPARTMENT_OTHER): Payer: Medicare Other | Admitting: Internal Medicine

## 2023-04-13 ENCOUNTER — Inpatient Hospital Stay: Payer: Medicare Other | Attending: Internal Medicine

## 2023-04-13 VITALS — BP 133/70 | HR 61 | Temp 98.0°F | Resp 16 | Ht 63.0 in | Wt 144.7 lb

## 2023-04-13 DIAGNOSIS — Z7902 Long term (current) use of antithrombotics/antiplatelets: Secondary | ICD-10-CM | POA: Insufficient documentation

## 2023-04-13 DIAGNOSIS — K5900 Constipation, unspecified: Secondary | ICD-10-CM | POA: Insufficient documentation

## 2023-04-13 DIAGNOSIS — D509 Iron deficiency anemia, unspecified: Secondary | ICD-10-CM | POA: Insufficient documentation

## 2023-04-13 DIAGNOSIS — Z79899 Other long term (current) drug therapy: Secondary | ICD-10-CM | POA: Diagnosis not present

## 2023-04-13 DIAGNOSIS — E7800 Pure hypercholesterolemia, unspecified: Secondary | ICD-10-CM | POA: Insufficient documentation

## 2023-04-13 DIAGNOSIS — Z85820 Personal history of malignant melanoma of skin: Secondary | ICD-10-CM | POA: Diagnosis not present

## 2023-04-13 DIAGNOSIS — D500 Iron deficiency anemia secondary to blood loss (chronic): Secondary | ICD-10-CM

## 2023-04-13 DIAGNOSIS — Z885 Allergy status to narcotic agent status: Secondary | ICD-10-CM | POA: Diagnosis not present

## 2023-04-13 DIAGNOSIS — I2510 Atherosclerotic heart disease of native coronary artery without angina pectoris: Secondary | ICD-10-CM | POA: Insufficient documentation

## 2023-04-13 DIAGNOSIS — Z9049 Acquired absence of other specified parts of digestive tract: Secondary | ICD-10-CM | POA: Diagnosis not present

## 2023-04-13 DIAGNOSIS — Z91030 Bee allergy status: Secondary | ICD-10-CM | POA: Diagnosis not present

## 2023-04-13 DIAGNOSIS — Z90710 Acquired absence of both cervix and uterus: Secondary | ICD-10-CM | POA: Diagnosis not present

## 2023-04-13 DIAGNOSIS — I10 Essential (primary) hypertension: Secondary | ICD-10-CM | POA: Diagnosis not present

## 2023-04-13 DIAGNOSIS — E538 Deficiency of other specified B group vitamins: Secondary | ICD-10-CM | POA: Insufficient documentation

## 2023-04-13 DIAGNOSIS — D508 Other iron deficiency anemias: Secondary | ICD-10-CM

## 2023-04-13 LAB — CBC WITH DIFFERENTIAL (CANCER CENTER ONLY)
Abs Immature Granulocytes: 0.01 10*3/uL (ref 0.00–0.07)
Basophils Absolute: 0 10*3/uL (ref 0.0–0.1)
Basophils Relative: 0 %
Eosinophils Absolute: 0.3 10*3/uL (ref 0.0–0.5)
Eosinophils Relative: 4 %
HCT: 36.4 % (ref 36.0–46.0)
Hemoglobin: 11.6 g/dL — ABNORMAL LOW (ref 12.0–15.0)
Immature Granulocytes: 0 %
Lymphocytes Relative: 32 %
Lymphs Abs: 2.2 10*3/uL (ref 0.7–4.0)
MCH: 27.8 pg (ref 26.0–34.0)
MCHC: 31.9 g/dL (ref 30.0–36.0)
MCV: 87.1 fL (ref 80.0–100.0)
Monocytes Absolute: 0.5 10*3/uL (ref 0.1–1.0)
Monocytes Relative: 8 %
Neutro Abs: 3.9 10*3/uL (ref 1.7–7.7)
Neutrophils Relative %: 56 %
Platelet Count: 278 10*3/uL (ref 150–400)
RBC: 4.18 MIL/uL (ref 3.87–5.11)
RDW: 12.9 % (ref 11.5–15.5)
WBC Count: 7 10*3/uL (ref 4.0–10.5)
nRBC: 0 % (ref 0.0–0.2)

## 2023-04-13 LAB — CMP (CANCER CENTER ONLY)
ALT: 9 U/L (ref 0–44)
AST: 18 U/L (ref 15–41)
Albumin: 4.3 g/dL (ref 3.5–5.0)
Alkaline Phosphatase: 49 U/L (ref 38–126)
Anion gap: 5 (ref 5–15)
BUN: 18 mg/dL (ref 8–23)
CO2: 29 mmol/L (ref 22–32)
Calcium: 9.7 mg/dL (ref 8.9–10.3)
Chloride: 105 mmol/L (ref 98–111)
Creatinine: 1.08 mg/dL — ABNORMAL HIGH (ref 0.44–1.00)
GFR, Estimated: 50 mL/min — ABNORMAL LOW (ref >60)
Glucose, Bld: 88 mg/dL (ref 70–99)
Potassium: 4.2 mmol/L (ref 3.5–5.1)
Sodium: 139 mmol/L (ref 135–145)
Total Bilirubin: 0.7 mg/dL (ref 0.0–1.2)
Total Protein: 6.6 g/dL (ref 6.5–8.1)

## 2023-04-13 LAB — IRON AND IRON BINDING CAPACITY (CC-WL,HP ONLY)
Iron: 64 ug/dL (ref 28–170)
Saturation Ratios: 12 % (ref 10.4–31.8)
TIBC: 519 ug/dL — ABNORMAL HIGH (ref 250–450)
UIBC: 455 ug/dL — ABNORMAL HIGH (ref 148–442)

## 2023-04-13 LAB — VITAMIN B12: Vitamin B-12: 383 pg/mL (ref 180–914)

## 2023-04-13 LAB — FOLATE: Folate: 20.6 ng/mL (ref >5.9)

## 2023-04-13 LAB — FERRITIN: Ferritin: 10 ng/mL — ABNORMAL LOW (ref 11–307)

## 2023-04-13 NOTE — Progress Notes (Signed)
[facility] [phone]   [phone]  OFFICE PROGRESS NOTE  [name], FNP [address] [place] [address]  DIAGNOSIS: Microcytic anemia secondary to iron deficiency.  PRIOR THERAPY: Iron infusion with Venofer 300 Mg IV x3 doses.  Last dose was given 07/09/2020.  CURRENT THERAPY: Niferex 150 mg p.o. daily.  INTERVAL HISTORY: Barbara Maxwell 88 y.o. female returns to the clinic today for follow-up visit.Discussed the use of AI scribe software for clinical note transcription with the patient, who gave verbal consent to proceed.  History of Present Illness   Barbara Maxwell is an 88 year old female with iron deficiency anemia who presents for follow-up of her condition.  She has a history of iron deficiency anemia due to gastrointestinal blood loss, previously managed with Venofer iron infusions, though the exact timing of these infusions is unclear. She is currently taking Niferex, an iron supplement, at a dose of 150 mg once daily. Initially, she did not recognize the name Niferex but confirmed her daily intake after clarification. She experiences occasional constipation as a side effect but does not require a stool softener.  Recent blood work shows a hemoglobin level of 11.6 g/dL, slightly below the normal range. Iron studies indicate a serum iron of 64 mcg/dL and an iron saturation of 12%. The ferritin level is pending.  She feels more tired and fatigued recently, attributing this to her living situation and upcoming move. She resides in an independent living facility but plans to move to [place] to be closer to her daughters.       MEDICAL HISTORY: Past Medical History:  Diagnosis Date   Anemia    B12 deficiency    Cancer (HCC)    [facility]-  MELANOMA REMOVED FROM BACK    Coronary artery disease    Depression    Hypercholesterolemia    Hypertension     ALLERGIES:  is allergic to bee venom,  simvastatin, codeine, evista [raloxifene], and percocet [oxycodone-acetaminophen].  MEDICATIONS:  Current Outpatient Medications  Medication Sig Dispense Refill   acetaminophen (TYLENOL) 325 MG tablet Take 325 mg by mouth every 6 (six) hours as needed for moderate pain.      amLODipine (NORVASC) 2.5 MG tablet TAKE 1 TABLET DAILY 90 tablet 3   Calcium Carbonate-Vitamin D (CALCIUM + D PO) Take 1 tablet by mouth daily.     Carboxymethylcellulose Sodium (REFRESH TEARS OP) Place 1 drop into both eyes daily.     clopidogrel (PLAVIX) 75 MG tablet TAKE 1 TABLET DAILY WITH BREAKFAST 90 tablet 3   ezetimibe (ZETIA) 10 MG tablet TAKE 1 TABLET DAILY 90 tablet 3   fenofibrate 54 MG tablet TAKE 1 TABLET(54 MG) BY MOUTH DAILY 90 tablet 3   iron polysaccharides (NIFEREX) 150 MG capsule TAKE 1 CAPSULE(150 MG) BY MOUTH DAILY 90 capsule 2   methocarbamol (ROBAXIN) 500 MG tablet Take 1 tablet (500 mg total) by mouth at bedtime as needed for muscle spasms. 30 tablet 0   metoprolol succinate (TOPROL-XL) 25 MG 24 hr tablet TAKE 1 TABLET DAILY 90 tablet 3   nitroGLYCERIN (NITROSTAT) 0.4 MG SL tablet Place 1 tablet (0.4 mg total) under the tongue every 5 (five) minutes as needed for chest pain. 25 tablet 5   pantoprazole (PROTONIX) 40 MG tablet TAKE 1 TABLET TWICE A DAY BEFORE MEALS 180 tablet 1   rosuvastatin (CRESTOR) 40 MG tablet TAKE 1 TABLET DAILY 90 tablet 3  sertraline (ZOLOFT) 50 MG tablet TAKE 1 TABLET DAILY 90 tablet 3   sodium chloride (OCEAN) 0.65 % SOLN nasal spray Place 1 spray into both nostrils daily.     traMADol (ULTRAM) 50 MG tablet Take 1 tablet (50 mg total) by mouth every 6 (six) hours as needed. 30 tablet 0   No current facility-administered medications for this visit.    SURGICAL HISTORY:  Past Surgical History:  Procedure Laterality Date   ABDOMINAL HYSTERECTOMY     VAGINAL HYSTERECTOMY WITH OVARIAN PRESERVATION    ABDOMINOPLASTY  1998   APPENDECTOMY     BREAST SURGERY  1985    BREAST REDUCTION    CARDIAC CATHETERIZATION  11/19/2019   CORONARY BALLOON ANGIOPLASTY N/A 11/19/2019   Procedure: CORONARY BALLOON ANGIOPLASTY;  Surgeon: Runell Gess, MD;  Location: [facility];  Service: Cardiovascular;  Laterality: N/A;   LEFT HEART CATH AND CORONARY ANGIOGRAPHY N/A 11/19/2019   Procedure: LEFT HEART CATH AND CORONARY ANGIOGRAPHY;  Surgeon: Runell Gess, MD;  Location: [facility];  Service: Cardiovascular;  Laterality: N/A;   TONSILLECTOMY AND ADENOIDECTOMY      REVIEW OF SYSTEMS:  A comprehensive review of systems was negative.   PHYSICAL EXAMINATION: General appearance: alert, cooperative, and no distress Head: Normocephalic, without obvious abnormality, atraumatic Neck: no adenopathy, no JVD, supple, symmetrical, trachea midline, and thyroid not enlarged, symmetric, no tenderness/mass/nodules Lymph nodes: Cervical, supraclavicular, and axillary nodes normal. Resp: clear to auscultation bilaterally Back: symmetric, no curvature. ROM normal. No CVA tenderness. Cardio: regular rate and rhythm, S1, S2 normal, no murmur, click, rub or gallop GI: soft, non-tender; bowel sounds normal; no masses,  no organomegaly Extremities: extremities normal, atraumatic, no cyanosis or edema  ECOG PERFORMANCE STATUS: 1 - Symptomatic but completely ambulatory  Blood pressure (!) 144/65, pulse 61, temperature 98 F (36.7 C), temperature source Temporal, resp. rate 16, height 5\' 3"  (1.6 m), weight 144 lb 11.2 oz (65.6 kg), SpO2 97%.  LABORATORY DATA: Lab Results  Component Value Date   WBC 7.0 04/13/2023   HGB 11.6 (L) 04/13/2023   HCT 36.4 04/13/2023   MCV 87.1 04/13/2023   PLT 278 04/13/2023      Chemistry      Component Value Date/Time   NA 139 04/13/2023 0951   NA 141 11/16/2019 1151   K 4.2 04/13/2023 0951   CL 105 04/13/2023 0951   CO2 29 04/13/2023 0951   BUN 18 04/13/2023 0951   BUN 13 11/16/2019 1151   CREATININE 1.08 (H) 04/13/2023 0951    CREATININE 0.77 09/07/2019 0956      Component Value Date/Time   CALCIUM 9.7 04/13/2023 0951   ALKPHOS 49 04/13/2023 0951   AST 18 04/13/2023 0951   ALT 9 04/13/2023 0951   BILITOT 0.7 04/13/2023 0951       RADIOGRAPHIC STUDIES: No results found.  ASSESSMENT AND PLAN: This is a very pleasant 88 years old white female with microcytic anemia secondary to iron deficiency likely from gastrointestinal blood loss.  The patient also has mild vitamin B12 deficiency. She was treated with iron infusion with Venofer 300 mg IV weekly for 3 weeks and tolerated it fairly well. She is currently on oral iron tablet with Niferex 150 mg p.o. daily and tolerating it fairly well.   Iron Deficiency Anemia   Chronic iron deficiency anemia likely secondary to gastrointestinal blood loss. Managed with Venofer iron infusions in the past and currently on Niferex (polysaccharide iron complex) 150 mg once  daily. Hemoglobin is 11.6 g/dL, serum iron is 64 g/dL, and iron saturation is 12%. Ferritin levels are pending. Reports mild constipation as a side effect of the iron supplement. Discussed the importance of taking the iron supplement with orange juice to enhance absorption and avoiding black tea to prevent interference with absorption. No need for iron infusion at this time.   - Continue Niferex 150 mg once daily   - Take iron supplement with orange juice to enhance absorption   - Avoid black tea when taking iron supplement   - Establish care with a family doctor and hematologist in [place]    Hypertension   Reports recent elevated blood pressure readings, likely related to stress from an upcoming move. No specific blood pressure readings provided.   - Establish care with a cardiologist in [place]    General Health Maintenance   Moving to [place] to be closer to family. Will be living with her daughter, a Engineer, civil (consulting), and her son-in-law, a Garment/textile technologist. She has arranged for her to establish care with a primary  doctor and cardiologist.   - Establish care with a primary doctor in [place].   The patient was advised to call immediately if she has any concerning symptoms in the interval.  The patient voices understanding of current disease status and treatment options and is in agreement with the current care plan.  All questions were answered. The patient knows to call the clinic with any problems, questions or concerns. We can certainly see the patient much sooner if necessary.   Disclaimer: This note was dictated with voice recognition software. Similar sounding words can inadvertently be transcribed and may not be corrected upon review.

## 2023-04-15 ENCOUNTER — Telehealth: Payer: Self-pay | Admitting: *Deleted

## 2023-04-15 ENCOUNTER — Other Ambulatory Visit: Payer: Medicare Other

## 2023-04-15 NOTE — Telephone Encounter (Signed)
E2C2 please transfer pt to our office to discuss:  Pt is scheduled on our lab schedule for an iron infusion.  We do not do iron infusions. Looks like appt was scheduled in error on 03/31/23.   Upon chart review of consult with hematology, infusion is not recommended at this time. Left message for pt to return my call to verify and cancel lab appt if labs are not needed at this time.

## 2023-04-18 LAB — PROTEIN ELECTROPHORESIS, SERUM, WITH REFLEX
A/G Ratio: 1.4 (ref 0.7–1.7)
Albumin ELP: 3.8 g/dL (ref 2.9–4.4)
Alpha-1-Globulin: 0.2 g/dL (ref 0.0–0.4)
Alpha-2-Globulin: 0.7 g/dL (ref 0.4–1.0)
Beta Globulin: 0.9 g/dL (ref 0.7–1.3)
Gamma Globulin: 0.8 g/dL (ref 0.4–1.8)
Globulin, Total: 2.8 g/dL (ref 2.2–3.9)
Total Protein ELP: 6.6 g/dL (ref 6.0–8.5)

## 2023-05-07 ENCOUNTER — Other Ambulatory Visit: Payer: Self-pay | Admitting: Family

## 2023-05-07 ENCOUNTER — Other Ambulatory Visit: Payer: Self-pay | Admitting: Cardiovascular Disease

## 2023-05-17 DIAGNOSIS — J1569 Pneumonia due to other gram-negative bacteria: Secondary | ICD-10-CM | POA: Diagnosis not present

## 2023-05-17 DIAGNOSIS — Z6825 Body mass index (BMI) 25.0-25.9, adult: Secondary | ICD-10-CM | POA: Diagnosis not present

## 2023-06-01 ENCOUNTER — Telehealth: Payer: Self-pay | Admitting: Family

## 2023-06-01 NOTE — Telephone Encounter (Signed)
 Copied from CRM [id number]. Topic: Referral - Request for Referral >> Jun 01, 2023  9:52 AM Lorin Glass B wrote: Did the patient discuss referral with their provider in the last year? Yes (If No - schedule appointment) (If Yes - send message)  Appointment offered? Yes  Type of order/referral and detailed reason for visit: Cardiologist; patient relocated to [place], does not have new PCP yet  Preference of office, provider, location: Dr. Royston Cowper; [address] [place], [place] [address] [address] Phone #[phone]  If referral order, have you been seen by this specialty before? Yes, Dr. Nanetta Batty (If Yes, this issue or another issue? When? Where?  Can we respond through MyChart? Yes

## 2023-06-02 ENCOUNTER — Other Ambulatory Visit: Payer: Self-pay | Admitting: Family

## 2023-06-02 DIAGNOSIS — I2510 Atherosclerotic heart disease of native coronary artery without angina pectoris: Secondary | ICD-10-CM

## 2023-07-05 ENCOUNTER — Telehealth: Payer: Self-pay | Admitting: Family

## 2023-07-05 NOTE — Telephone Encounter (Signed)
 Spoke with patient to schedule awv Patient stated she has moved to [place]    She has a  new provider

## 2023-07-21 ENCOUNTER — Other Ambulatory Visit: Payer: Self-pay | Admitting: Family

## 2023-07-21 DIAGNOSIS — F32A Depression, unspecified: Secondary | ICD-10-CM
